# Patient Record
Sex: Female | Born: 1990 | Race: Black or African American | Hispanic: No | Marital: Single | Smoking: Current every day smoker
Health system: Southern US, Community
[De-identification: ages and names within clinical notes are randomized; demographics above are authoritative.]

## PROBLEM LIST (undated history)

## (undated) DIAGNOSIS — E669 Obesity, unspecified: Secondary | ICD-10-CM

## (undated) DIAGNOSIS — A599 Trichomoniasis, unspecified: Secondary | ICD-10-CM

## (undated) DIAGNOSIS — J45909 Unspecified asthma, uncomplicated: Secondary | ICD-10-CM

## (undated) DIAGNOSIS — E282 Polycystic ovarian syndrome: Secondary | ICD-10-CM

## (undated) HISTORY — DX: Unspecified asthma, uncomplicated: J45.909

## (undated) HISTORY — DX: Polycystic ovarian syndrome: E28.2

## (undated) HISTORY — DX: Obesity, unspecified: E66.9

## (undated) HISTORY — PX: NO PAST SURGERIES: SHX2092

## (undated) HISTORY — DX: Trichomoniasis, unspecified: A59.9

---
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
## 2012-02-23 ENCOUNTER — Ambulatory Visit (INDEPENDENT_AMBULATORY_CARE_PROVIDER_SITE_OTHER): Payer: Medicaid Other | Admitting: Otolaryngology

## 2012-03-01 ENCOUNTER — Ambulatory Visit (INDEPENDENT_AMBULATORY_CARE_PROVIDER_SITE_OTHER): Payer: Medicaid Other | Admitting: Otolaryngology

## 2012-03-01 DIAGNOSIS — G4730 Sleep apnea, unspecified: Secondary | ICD-10-CM

## 2012-03-08 ENCOUNTER — Other Ambulatory Visit: Payer: Self-pay

## 2012-03-08 DIAGNOSIS — G4700 Insomnia, unspecified: Secondary | ICD-10-CM

## 2012-03-26 ENCOUNTER — Other Ambulatory Visit: Payer: Self-pay | Admitting: Otolaryngology

## 2012-04-16 ENCOUNTER — Encounter: Payer: Self-pay | Admitting: *Deleted

## 2012-05-14 ENCOUNTER — Ambulatory Visit: Payer: Self-pay | Admitting: Pediatrics

## 2012-09-17 ENCOUNTER — Encounter: Payer: Self-pay | Admitting: Family Medicine

## 2012-09-17 ENCOUNTER — Ambulatory Visit (INDEPENDENT_AMBULATORY_CARE_PROVIDER_SITE_OTHER): Payer: Medicaid Other | Admitting: Family Medicine

## 2012-09-17 VITALS — BP 110/60 | Ht 63.5 in | Wt 243.6 lb

## 2012-09-17 DIAGNOSIS — N920 Excessive and frequent menstruation with regular cycle: Secondary | ICD-10-CM

## 2012-09-17 DIAGNOSIS — N926 Irregular menstruation, unspecified: Secondary | ICD-10-CM

## 2012-09-17 DIAGNOSIS — Z720 Tobacco use: Secondary | ICD-10-CM

## 2012-09-17 DIAGNOSIS — F17200 Nicotine dependence, unspecified, uncomplicated: Secondary | ICD-10-CM

## 2012-09-17 DIAGNOSIS — H538 Other visual disturbances: Secondary | ICD-10-CM | POA: Insufficient documentation

## 2012-09-17 LAB — POCT URINE PREGNANCY: Preg Test, Ur: NEGATIVE

## 2012-09-17 LAB — GLUCOSE, POCT (MANUAL RESULT ENTRY): POC Glucose: 124 mg/dl — AB (ref 70–99)

## 2012-09-17 NOTE — Progress Notes (Signed)
Subjective:    Patient ID: Gina Snow, female    DOB: 01-06-1991, 22 y.o.   MRN: [id number]  HPI Comments: Gina Snow is a 22 y.o AAF here, new to me but established to this practice.  She is here for a wellness exam.  She has complaints of irregular menstrual cycles.  She says her menstrual cycle this past month was irregular and prolonged. She says normally her periods have lasted for about 3-6 days. She first had menarche at age 22 years old. There have been times where she would have more than one period in a month.  She has an episode where she didn't have a period at all.  This occurred 4 months ago.  She says this month, she has had 3 weeks of her period.  She goes through a whole box of tampons in 2 days, each box having about 18 tampons, super absorbency. This is the first time she has had to go through this many tampons in a few days. She does have clots and she has more cramps.   She is sexually active with a female but the last time with a female, over 2 years.  She has a family hx of hypothyroid in her mother and maternal grandmother. She denies any hx of breast or ovarian cancers.   She doesn't have any medical problems that she knows of. She has never had any STD.  She smokes cigarettes and a pack will last 3 days.  She does drink alcohol occasionally about once every 2-3 months.  She doesn't have any children and has never been pregnant. She has a father who lives in [place] but she really doesn't stay in touch with him.       Review of Systems  Constitutional: Positive for unexpected weight change. Negative for activity change, appetite change and fatigue.  HENT: Negative for hearing loss, sore throat, trouble swallowing and voice change.   Eyes: Positive for visual disturbance.       Occasional vision changes/blurred vision   Respiratory: Negative for chest tightness, shortness of breath and wheezing.   Cardiovascular: Positive for palpitations. Negative for  chest pain.  Gastrointestinal: Negative for nausea, vomiting, abdominal pain, diarrhea and constipation.  Endocrine: Negative for cold intolerance, heat intolerance, polydipsia and polyuria.  Genitourinary: Positive for vaginal bleeding and menstrual problem. Negative for dysuria, urgency, decreased urine volume, vaginal discharge, vaginal pain and pelvic pain.  Skin: Negative for color change.  Neurological: Negative for dizziness, syncope, weakness, light-headedness, numbness and headaches.  Psychiatric/Behavioral: Negative for sleep disturbance.       Objective:   Physical Exam  Nursing note and vitals reviewed. Constitutional: She is oriented to person, place, and time.  Overweight AAF in NAD   HENT:  Head: Normocephalic and atraumatic.  Right Ear: External ear normal.  Left Ear: External ear normal.  Nose: Nose normal.  Mouth/Throat: Oropharynx is clear and moist.  Eyes: Conjunctivae and EOM are normal. Pupils are equal, round, and reactive to light.  Neck: Normal range of motion. Neck supple. No tracheal deviation present. No thyromegaly present.  Cardiovascular: Normal rate, regular rhythm, normal heart sounds and intact distal pulses.   No murmur heard. Pulmonary/Chest: Effort normal and breath sounds normal. No respiratory distress. She has no wheezes. She exhibits no tenderness.  Abdominal: Soft. Bowel sounds are normal. She exhibits no distension and no mass. There is no tenderness.  Genitourinary:  Deferred, still on menstrual cycle   Neurological: She is alert and oriented  to person, place, and time.  Skin: Skin is warm and dry. No pallor.  Psychiatric: She has a normal mood and affect. Her behavior is normal. Judgment and thought content normal.      Assessment & Plan:  Gina Snow was seen today for well child.  Diagnoses and associated orders for this visit:  Wellness exam  Blurred vision  Irregular menstrual cycle - CBC with Differential - TSH + free  T4 - POCT urine pregnancy - POCT glucose (manual entry)  Menorrhagia  -with history of menorrhagia, family hx of hypothyroidism; will obtain CBC for iron levels/platelet count.  Will also get TSH with T4 to check thyroid function.  Urine pregnancy was negative today in the office.  Will also get glucose since she's fasting and if elevated, may hint to PCOS/insulin resistance as cause of menorrhagia.    If these tests are inconclusive, will get prolactin, FSH/LH, pelvic ultrasound ordered during follow up in 1 week.    May begin OCP's if no etiology is found. Will need pelvic exam during follow up.

## 2012-09-17 NOTE — Patient Instructions (Addendum)
Menorrhagia Dysfunctional uterine bleeding is different from a normal menstrual period. When periods are heavy or there is more bleeding than is usual for you, it is called menorrhagia. It may be caused by hormonal imbalance, or physical, metabolic, or other problems. Examination is necessary in order that your caregiver may treat treatable causes. If this is a continuing problem, a D&C may be needed. That means that the cervix (the opening of the uterus or womb) is dilated (stretched larger) and the lining of the uterus is scraped out. The tissue scraped out is then examined under a microscope by a specialist (pathologist) to make sure there is nothing of concern that needs further or more extensive treatment. HOME CARE INSTRUCTIONS   If medications were prescribed, take exactly as directed. Do not change or switch medications without consulting your caregiver.  Long term heavy bleeding may result in iron deficiency. Your caregiver may have prescribed iron pills. They help replace the iron your body lost from heavy bleeding. Take exactly as directed. Iron may cause constipation. If this becomes a problem, increase the bran, fruits, and roughage in your diet.  Do not take aspirin or medicines that contain aspirin one week before or during your menstrual period. Aspirin may make the bleeding worse.  If you need to change your sanitary pad or tampon more than once every 2 hours, stay in bed and rest as much as possible until the bleeding stops.  Eat well-balanced meals. Eat foods high in iron. Examples are leafy green vegetables, meat, liver, eggs, and whole grain breads and cereals. Do not try to lose weight until the abnormal bleeding has stopped and your blood iron level is back to normal. SEEK MEDICAL CARE IF:   You need to change your sanitary pad or tampon more than once an hour.  You develop nausea (feeling sick to your stomach) and vomiting, dizziness, or diarrhea while you are taking your  medicine.  You have any problems that may be related to the medicine you are taking. SEEK IMMEDIATE MEDICAL CARE IF:   You have a fever.  You develop chills.  You develop severe bleeding or start to pass blood clots.  You feel dizzy or faint. MAKE SURE YOU:   Understand these instructions.  Will watch your condition.  Will get help right away if you are not doing well or get worse. Document Released: 01/31/2005 Document Revised: 04/25/2011 Document Reviewed: 09/21/2007 [facility] Patient Information 2014 [place], [place].  Hypothyroidism The thyroid is a large gland located in the lower front of your neck. The thyroid gland helps control metabolism. Metabolism is how your body handles food. It controls metabolism with the hormone thyroxine. When this gland is underactive (hypothyroid), it produces too little hormone.  CAUSES These include:   Absence or destruction of thyroid tissue.  Goiter due to iodine deficiency.  Goiter due to medications.  Congenital defects (since birth).  Problems with the pituitary. This causes a lack of TSH (thyroid stimulating hormone). This hormone tells the thyroid to turn out more hormone. SYMPTOMS  Lethargy (feeling as though you have no energy)  Cold intolerance  Weight gain (in spite of normal food intake)  Dry skin  Coarse hair  Menstrual irregularity (if severe, may lead to infertility)  Slowing of thought processes Cardiac problems are also caused by insufficient amounts of thyroid hormone. Hypothyroidism in the newborn is cretinism, and is an extreme form. It is important that this form be treated adequately and immediately or it will lead rapidly  to retarded physical and mental development. DIAGNOSIS  To prove hypothyroidism, your caregiver may do blood tests and ultrasound tests. Sometimes the signs are hidden. It may be necessary for your caregiver to watch this illness with blood tests either before or after diagnosis and  treatment. TREATMENT  Low levels of thyroid hormone are increased by using synthetic thyroid hormone. This is a safe, effective treatment. It usually takes about four weeks to gain the full effects of the medication. After you have the full effect of the medication, it will generally take another four weeks for problems to leave. Your caregiver may start you on low doses. If you have had heart problems the dose may be gradually increased. It is generally not an emergency to get rapidly to normal. HOME CARE INSTRUCTIONS   Take your medications as your caregiver suggests. Let your caregiver know of any medications you are taking or start taking. Your caregiver will help you with dosage schedules.  As your condition improves, your dosage needs may increase. It will be necessary to have continuing blood tests as suggested by your caregiver.  Report all suspected medication side effects to your caregiver. SEEK MEDICAL CARE IF: Seek medical care if you develop:  Sweating.  Tremulousness (tremors).  Anxiety.  Rapid weight loss.  Heat intolerance.  Emotional swings.  Diarrhea.  Weakness. SEEK IMMEDIATE MEDICAL CARE IF:  You develop chest pain, an irregular heart beat (palpitations), or a rapid heart beat. MAKE SURE YOU:   Understand these instructions.  Will watch your condition.  Will get help right away if you are not doing well or get worse. Document Released: 01/31/2005 Document Revised: 04/25/2011 Document Reviewed: 09/21/2007 [facility] Patient Information 2014 [place], [place].

## 2012-09-18 ENCOUNTER — Encounter: Payer: Self-pay | Admitting: Family Medicine

## 2012-09-18 LAB — CBC WITH DIFFERENTIAL/PLATELET
Basophils Absolute: 0 10*3/uL (ref 0.0–0.1)
Basophils Relative: 0 % (ref 0–1)
Eosinophils Absolute: 0.2 10*3/uL (ref 0.0–0.7)
Eosinophils Relative: 3 % (ref 0–5)
HCT: 38.3 % (ref 36.0–46.0)
Hemoglobin: 12.8 g/dL (ref 12.0–15.0)
Lymphocytes Relative: 29 % (ref 12–46)
Lymphs Abs: 1.9 10*3/uL (ref 0.7–4.0)
MCH: 30.9 pg (ref 26.0–34.0)
MCHC: 33.4 g/dL (ref 30.0–36.0)
MCV: 92.5 fL (ref 78.0–100.0)
Monocytes Absolute: 0.5 10*3/uL (ref 0.1–1.0)
Monocytes Relative: 8 % (ref 3–12)
Neutro Abs: 4.1 10*3/uL (ref 1.7–7.7)
Neutrophils Relative %: 60 % (ref 43–77)
Platelets: 315 10*3/uL (ref 150–400)
RBC: 4.14 MIL/uL (ref 3.87–5.11)
RDW: 15.2 % (ref 11.5–15.5)
WBC: 6.8 10*3/uL (ref 4.0–10.5)

## 2012-09-19 ENCOUNTER — Encounter: Payer: Self-pay | Admitting: Family Medicine

## 2012-09-19 LAB — TSH+FREE T4
Free T4: 1.11 ng/dL (ref 0.93–1.60)
TSH: 0.719 u[IU]/mL (ref 0.450–4.500)

## 2012-09-24 ENCOUNTER — Ambulatory Visit (INDEPENDENT_AMBULATORY_CARE_PROVIDER_SITE_OTHER): Payer: Medicaid Other | Admitting: Family Medicine

## 2012-09-24 VITALS — BP 122/78 | Wt 245.0 lb

## 2012-09-24 DIAGNOSIS — A499 Bacterial infection, unspecified: Secondary | ICD-10-CM

## 2012-09-24 DIAGNOSIS — N760 Acute vaginitis: Secondary | ICD-10-CM

## 2012-09-24 DIAGNOSIS — N898 Other specified noninflammatory disorders of vagina: Secondary | ICD-10-CM | POA: Insufficient documentation

## 2012-09-24 DIAGNOSIS — N920 Excessive and frequent menstruation with regular cycle: Secondary | ICD-10-CM

## 2012-09-24 DIAGNOSIS — R739 Hyperglycemia, unspecified: Secondary | ICD-10-CM | POA: Insufficient documentation

## 2012-09-24 DIAGNOSIS — R7309 Other abnormal glucose: Secondary | ICD-10-CM

## 2012-09-24 DIAGNOSIS — E559 Vitamin D deficiency, unspecified: Secondary | ICD-10-CM | POA: Insufficient documentation

## 2012-09-24 DIAGNOSIS — B9689 Other specified bacterial agents as the cause of diseases classified elsewhere: Secondary | ICD-10-CM

## 2012-09-24 DIAGNOSIS — N926 Irregular menstruation, unspecified: Secondary | ICD-10-CM | POA: Insufficient documentation

## 2012-09-24 LAB — COMPREHENSIVE METABOLIC PANEL
ALT: 18 U/L (ref 0–35)
AST: 16 U/L (ref 0–37)
Albumin: 4.5 g/dL (ref 3.5–5.2)
Alkaline Phosphatase: 60 U/L (ref 39–117)
BUN: 8 mg/dL (ref 6–23)
CO2: 28 mEq/L (ref 19–32)
Calcium: 9.3 mg/dL (ref 8.4–10.5)
Chloride: 105 mEq/L (ref 96–112)
Creat: 0.78 mg/dL (ref 0.50–1.10)
Glucose, Bld: 101 mg/dL — ABNORMAL HIGH (ref 70–99)
Potassium: 4.5 mEq/L (ref 3.5–5.3)
Sodium: 138 mEq/L (ref 135–145)
Total Bilirubin: 0.5 mg/dL (ref 0.3–1.2)
Total Protein: 7.3 g/dL (ref 6.0–8.3)

## 2012-09-24 LAB — LIPID PANEL
LDL Cholesterol: 99 mg/dL (ref 0–99)
Triglycerides: 106 mg/dL (ref <150)
VLDL: 21 mg/dL (ref 0–40)

## 2012-09-24 LAB — HEMOGLOBIN A1C
Hgb A1c MFr Bld: 5.4 % (ref <5.7)
Mean Plasma Glucose: 108 mg/dL (ref <117)

## 2012-09-24 MED ORDER — METFORMIN HCL 500 MG PO TABS: 500.0000 mg | ORAL_TABLET | Freq: Every day | ORAL | Status: DC

## 2012-09-24 NOTE — Patient Instructions (Addendum)
Exercise to Lose Weight Exercise and a healthy diet may help you lose weight. Your doctor may suggest specific exercises. EXERCISE IDEAS AND TIPS  Choose low-cost things you enjoy doing, such as walking, bicycling, or exercising to workout videos.  Take stairs instead of the elevator.  Walk during your lunch break.  Park your car further away from work or school.  Go to a gym or an exercise class.  Start with 5 to 10 minutes of exercise each day. Build up to 30 minutes of exercise 4 to 6 days a week.  Wear shoes with good support and comfortable clothes.  Stretch before and after working out.  Work out until you breathe harder and your heart beats faster.  Drink extra water when you exercise.  Do not do so much that you hurt yourself, feel dizzy, or get very short of breath. Exercises that burn about 150 calories:  Running 1  miles in 15 minutes.  Playing volleyball for 45 to 60 minutes.  Washing and waxing a car for 45 to 60 minutes.  Playing touch football for 45 minutes.  Walking 1  miles in 35 minutes.  Pushing a stroller 1  miles in 30 minutes.  Playing basketball for 30 minutes.  Raking leaves for 30 minutes.  Bicycling 5 miles in 30 minutes.  Walking 2 miles in 30 minutes.  Dancing for 30 minutes.  Shoveling snow for 15 minutes.  Swimming laps for 20 minutes.  Walking up stairs for 15 minutes.  Bicycling 4 miles in 15 minutes.  Gardening for 30 to 45 minutes.  Jumping rope for 15 minutes.  Washing windows or floors for 45 to 60 minutes. Document Released: 03/05/2010 Document Revised: 04/25/2011 Document Reviewed: 03/05/2010 [facility] Patient Information 2014 [place], [place]. Metformin tablets What is this medicine? METFORMIN (met FOR min) is used to treat type 2 diabetes. It helps to control blood sugar. Treatment is combined with diet and exercise. This medicine can be used alone or with other medicines for diabetes. This medicine may be  used for other purposes; ask your health care provider or pharmacist if you have questions. What should I tell my health care provider before I take this medicine? They need to know if you have any of these conditions: -anemia -frequently drink alcohol-containing beverages -become easily dehydrated -heart attack -heart failure that is treated with medications -kidney disease -liver disease -polycystic ovary syndrome -serious infection or injury -vomiting -an unusual or allergic reaction to metformin, other medicines, foods, dyes, or preservatives -pregnant or trying to get pregnant -breast-feeding How should I use this medicine? Take this medicine by mouth. Take it with meals. Swallow the tablets with a drink of water. Follow the directions on the prescription label. Take your medicine at regular intervals. Do not take your medicine more often than directed. Talk to your pediatrician regarding the use of this medicine in children. While this drug may be prescribed for children as young as 5 years of age for selected conditions, precautions do apply. Overdosage: If you think you have taken too much of this medicine contact a poison control center or emergency room at once. NOTE: This medicine is only for you. Do not share this medicine with others. What if I miss a dose? If you miss a dose, take it as soon as you can. If it is almost time for your next dose, take only that dose. Do not take double or extra doses. What may interact with this medicine? Do not take this medicine  with any of the following medications: -dofetilide -gatifloxacin -certain contrast medicines given before X-rays, CT scans, MRI, or other procedures This medicine may also interact with the following medications: -digoxin -diuretics -female hormones, like estrogens or progestins and birth control pills -isoniazid -medicines for blood pressure, heart disease, irregular heart beat -morphine -nicotinic  acid -phenothiazines like chlorpromazine, mesoridazine, prochlorperazine, thioridazine -phenytoin -procainamide -quinidine -quinine -ranitidine -steroid medicines like prednisone or cortisone -stimulant medicines for attention disorders, weight loss, or to stay awake -thyroid medicines -trimethoprim -vancomycin This list may not describe all possible interactions. Give your health care provider a list of all the medicines, herbs, non-prescription drugs, or dietary supplements you use. Also tell them if you smoke, drink alcohol, or use illegal drugs. Some items may interact with your medicine. What should I watch for while using this medicine? Visit your doctor or health care professional for regular checks on your progress. Learn how to check your blood sugar. Learn the symptoms of low and high blood sugar and how to manage them. If you have low blood sugar, eat or drink something that has sugar. Make sure others know to get medical help quickly if you have serious symptoms of low blood sugar, like if you become unconscious or have a seizure. If you need surgery or if you will need a procedure with contrast drugs, tell your doctor or health care professional that you are taking this medicine. Wear a medical identification bracelet or chain to say you have diabetes, and carry a card that lists all your medications. What side effects may I notice from receiving this medicine? Side effects that you should report to your doctor or health care professional as soon as possible: -allergic reactions like skin rash, itching or hives, swelling of the face, lips, or tongue -breathing problems -feeling faint or lightheaded, falls -low blood sugar (ask your doctor or health care professional for a list of these symptoms) -muscle aches or pains -slow or irregular heartbeat -unusual stomach pain or discomfort -unusually tired or weak Side effects that usually do not require medical attention (report to  your doctor or health care professional if they continue or are bothersome): -diarrhea -headache -heartburn -metallic taste in mouth -nausea -stomach gas, upset This list may not describe all possible side effects. Call your doctor for medical advice about side effects. You may report side effects to FDA at 1-800-FDA-1088. Where should I keep my medicine? Keep out of the reach of children. Store at room temperature between 15 and 30 degrees C (59 and 86 degrees F). Protect from moisture and light. Throw away any unused medicine after the expiration date. NOTE: This sheet is a summary. It may not cover all possible information. If you have questions about this medicine, talk to your doctor, pharmacist, or health care provider.  2012, [employer]vier/[employer] Standard. (08/20/2007 3:40:54 PM)bvBacterial Vaginosis Bacterial vaginosis (BV) is a vaginal infection where the normal balance of bacteria in the vagina is disrupted. The normal balance is then replaced by an overgrowth of certain bacteria. There are several different kinds of bacteria that can cause BV. BV is the most common vaginal infection in women of childbearing age. CAUSES   The cause of BV is not fully understood. BV develops when there is an increase or imbalance of harmful bacteria.  Some activities or behaviors can upset the normal balance of bacteria in the vagina and put women at increased risk including:  Having a new sex partner or multiple sex partners.  Douching.  Using an  intrauterine device (IUD) for contraception.  It is not clear what role sexual activity plays in the development of BV. However, women that have never had sexual intercourse are rarely infected with BV. Women do not get BV from toilet seats, bedding, swimming pools or from touching objects around them.  SYMPTOMS   Grey vaginal discharge.  A fish-like odor with discharge, especially after sexual intercourse.  Itching or burning of the vagina and  vulva.  Burning or pain with urination.  Some women have no signs or symptoms at all. DIAGNOSIS  Your caregiver must examine the vagina for signs of BV. Your caregiver will perform lab tests and look at the sample of vaginal fluid through a microscope. They will look for bacteria and abnormal cells (clue cells), a pH test higher than 4.5, and a positive amine test all associated with BV.  RISKS AND COMPLICATIONS   Pelvic inflammatory disease (PID).  Infections following gynecology surgery.  Developing HIV.  Developing herpes virus. TREATMENT  Sometimes BV will clear up without treatment. However, all women with symptoms of BV should be treated to avoid complications, especially if gynecology surgery is planned. Female partners generally do not need to be treated. However, BV may spread between female sex partners so treatment is helpful in preventing a recurrence of BV.   BV may be treated with antibiotics. The antibiotics come in either pill or vaginal cream forms. Either can be used with nonpregnant or pregnant women, but the recommended dosages differ. These antibiotics are not harmful to the baby.  BV can recur after treatment. If this happens, a second round of antibiotics will often be prescribed.  Treatment is important for pregnant women. If not treated, BV can cause a premature delivery, especially for a pregnant woman who had a premature birth in the past. All pregnant women who have symptoms of BV should be checked and treated.  For chronic reoccurrence of BV, treatment with a type of prescribed gel vaginally twice a week is helpful. HOME CARE INSTRUCTIONS   Finish all medication as directed by your caregiver.  Do not have sex until treatment is completed.  Tell your sexual partner that you have a vaginal infection. They should see their caregiver and be treated if they have problems, such as a mild rash or itching.  Practice safe sex. Use condoms. Only have 1 sex  partner. PREVENTION  Basic prevention steps can help reduce the risk of upsetting the natural balance of bacteria in the vagina and developing BV:  Do not have sexual intercourse (be abstinent).  Do not douche.  Use all of the medicine prescribed for treatment of BV, even if the signs and symptoms go away.  Tell your sex partner if you have BV. That way, they can be treated, if needed, to prevent reoccurrence. SEEK MEDICAL CARE IF:   Your symptoms are not improving after 3 days of treatment.  You have increased discharge, pain, or fever. MAKE SURE YOU:   Understand these instructions.  Will watch your condition.  Will get help right away if you are not doing well or get worse. FOR MORE INFORMATION  Division of STD Prevention (DSTDP), Centers for Disease Control and Prevention: [employer] American Social Health Association (ASHA): www.ashastd.org  Document Released: 01/31/2005 Document Revised: 04/25/2011 Document Reviewed: 07/24/2008 [facility] Patient Information 2014 [place], [place].

## 2012-09-24 NOTE — Progress Notes (Signed)
Subjective:    Patient ID: Gina Snow, female    DOB: 09-Oct-1990, 22 y.o.   MRN: [id number]  HPI Comments: Gina Snow is a 22 y.o AAF here for follow up of irregular menses.  She was seen initially on 09/17/12 for irregular menses. At that time, she was on her period for 3 weeks prior. She says she's had abnormal periods for about 4 months. She says she did notice a weight gain in this period but denies any new medicines.  She says she has ranged between 200-220 pounds. She says she would lose weight and stop exercising and watching her diet. Her weight would come back after she stops these activities. She had thyroid function checked and this was normal. She was noted to have elevated sugar at that time as well with a positive family history of diabetes, HTN, and HLD.    She has a PMH of vitamin D deficiency but says she has been off of her medicines for some months. She's up to date with her immunizations. She continues to use tobacco products and she's sexually active with a female only.      Review of Systems  Constitutional: Positive for unexpected weight change. Negative for activity change, appetite change and fatigue.  Eyes: Negative for visual disturbance.  Respiratory: Negative for chest tightness, shortness of breath and wheezing.   Cardiovascular: Negative for chest pain and palpitations.  Gastrointestinal: Negative for nausea, vomiting, abdominal pain, diarrhea and constipation.  Endocrine: Negative for polydipsia and polyuria.  Genitourinary: Positive for menstrual problem. Negative for dysuria, urgency, hematuria, flank pain, vaginal bleeding, vaginal discharge, vaginal pain and pelvic pain.  Skin: Negative for color change and wound.  Neurological: Negative for dizziness, weakness and headaches.  Psychiatric/Behavioral: Negative for behavioral problems and decreased concentration.       Objective:   Physical Exam  Nursing note and vitals reviewed. Constitutional:  She is oriented to person, place, and time.  Obese AAF in NAD  HENT:  Head: Normocephalic and atraumatic.  Right Ear: External ear normal.  Left Ear: External ear normal.  Nose: Nose normal.  Mouth/Throat: Oropharynx is clear and moist.  Eyes: Conjunctivae and EOM are normal. Pupils are equal, round, and reactive to light.  Neck: Normal range of motion. No tracheal deviation present. No thyromegaly present.  Cardiovascular: Normal rate and normal heart sounds.   Pulmonary/Chest: Effort normal and breath sounds normal. No respiratory distress. She has no wheezes.  Abdominal: Soft. Bowel sounds are normal. She exhibits no distension. There is no tenderness. There is no rebound.  Genitourinary: Uterus normal. Pelvic exam was performed with patient supine. Uterus is not deviated, not enlarged, not fixed and not tender. Cervix exhibits discharge. Cervix exhibits no motion tenderness and no friability. Right adnexum displays no tenderness. Left adnexum displays no tenderness. Vaginal discharge found.  Musculoskeletal: Normal range of motion.  Neurological: She is alert and oriented to person, place, and time.  Skin: Skin is warm and dry. No rash noted.  Psychiatric: She has a normal mood and affect. Her behavior is normal.      Assessment & Plan:  Gina Snow was seen today for follow-up.  Diagnoses and associated orders for this visit:  Irregular menses - US Transvaginal Non-OB; Future  Hyperglycemia - Hemoglobin A1c - Comprehensive metabolic panel - Lipid panel - metFORMIN (GLUCOPHAGE) 500 MG tablet; Take 1 tablet (500 mg total) by mouth daily with breakfast.  Unspecified vitamin D deficiency - Vitamin D 25 hydroxy  BV (bacterial  vaginosis) - Cancel: Bacterial Vaginosis w/out Reflex - Bacterial Vaginosis w/out Reflex  Menorrhagia - US Transvaginal Non-OB; Future   -will schedule ultrasound. Start Metformin daily for insulin resistance. Getting Hgb A1c to check.  Also getting CMP  and lipid panel baseline labs before starting metformin.  Menorrhagia may be due to PCOS or fibroids. Ultrasound will rule in or out. Vaginal exam with discharge, likely BV. Will empirically treat with Flagyl. Have counseled patient on avoiding alcoholic drinks while taking this medicine. Metformin may cause diarrhea, etc and patient aware of possible side effects.  Will follow up in 4 weeks.

## 2012-09-25 ENCOUNTER — Telehealth: Payer: Self-pay | Admitting: *Deleted

## 2012-09-25 LAB — VITAMIN D 25 HYDROXY (VIT D DEFICIENCY, FRACTURES): Vit D, 25-Hydroxy: 27 ng/mL — ABNORMAL LOW (ref 30–89)

## 2012-09-25 NOTE — Telephone Encounter (Signed)
Patient called and stated that she has been to the pharmacy 2 times and her rx's aren't there.  I see where the metformin was entered but pharmacy says they didn't get it, and the flagyl wasn't sent over.    [facility]

## 2012-09-26 ENCOUNTER — Other Ambulatory Visit: Payer: Self-pay | Admitting: Family Medicine

## 2012-09-26 DIAGNOSIS — N760 Acute vaginitis: Secondary | ICD-10-CM

## 2012-09-26 DIAGNOSIS — R739 Hyperglycemia, unspecified: Secondary | ICD-10-CM

## 2012-09-26 LAB — BACTERIAL VAGINOSIS W/OUT REFLEX: Trichomonas vaginalis: NOT DETECTED

## 2012-09-26 MED ORDER — METRONIDAZOLE 500 MG PO TABS: 500.0000 mg | ORAL_TABLET | Freq: Two times a day (BID) | ORAL | Status: DC

## 2012-09-26 MED ORDER — METFORMIN HCL 500 MG PO TABS: 500.0000 mg | ORAL_TABLET | Freq: Every day | ORAL | Status: DC

## 2012-09-26 NOTE — Progress Notes (Signed)
Have attempted to contact patient at home and someone stated she no longer lived there. I have also contacted her on the cell phone on number that was provided and it was disconnected. Have sent in Metformin and Flagyl to [facility] as requested.

## 2012-10-26 ENCOUNTER — Encounter: Payer: Self-pay | Admitting: Family Medicine

## 2012-10-26 ENCOUNTER — Ambulatory Visit: Payer: Medicaid Other | Admitting: Family Medicine

## 2012-10-26 ENCOUNTER — Ambulatory Visit (INDEPENDENT_AMBULATORY_CARE_PROVIDER_SITE_OTHER): Payer: Medicaid Other | Admitting: Family Medicine

## 2012-10-26 VITALS — Temp 97.0°F | Wt 248.1 lb

## 2012-10-26 DIAGNOSIS — N926 Irregular menstruation, unspecified: Secondary | ICD-10-CM

## 2012-10-26 DIAGNOSIS — R7303 Prediabetes: Secondary | ICD-10-CM | POA: Insufficient documentation

## 2012-10-26 DIAGNOSIS — R7309 Other abnormal glucose: Secondary | ICD-10-CM

## 2012-10-26 NOTE — Patient Instructions (Addendum)
Secondary Amenorrhea   Secondary amenorrhea is the stopping of menstrual flow for 3 to 6 months in a female who has previously had periods. There are many possible causes. Most of these causes are not serious. Usually treating the underlying problem causing the loss of menses will return your periods to normal.  CAUSES   Some common and uncommon causes of not menstruating include:  · Malnutrition.  · Low blood sugar (hypoglycemia).  · Polycystic ovarian disease.  · Stress or fear.  · Breastfeeding.  · Hormone imbalance.  · Ovarian failure.  · Medications.  · Extreme obesity.  · Cystic fibrosis.  · Low body weight or drastic weight reduction from any cause.  · Early menopause.  · Removal of ovaries or uterus.  · Contraceptives.  · Illness.  · Long term (chronic) illnesses.  · Cushing's syndrome.  · Thyroid problems.  · Birth control pills, patches, or vaginal rings for birth control.  DIAGNOSIS   This diagnosis is made by your caregiver taking a medical history and doing a physical exam. Pregnancy must be ruled out. Often times, numerous blood tests of different hormones in the body may be measured. Urine testing may be done. Specialized x-rays may have to be done as well as measuring the body mass index (BMI).  TREATMENT   Treatment depends on the cause of the amenorrhea. If an eating disorder is present, this can be treated with an adequate diet and therapy. Chronic illnesses may improve with treatment of the illness. Overall, the outlook is good. The amenorrhea may be corrected with medications, lifestyle changes, or surgery. If the amenorrhea cannot be corrected, it is sometimes possible to create a false menstruation with medications.  Document Released: 03/14/2006 Document Revised: 04/25/2011 Document Reviewed: 01/19/2007  [employer]® Patient Information ©2014 [employer].

## 2012-10-26 NOTE — Progress Notes (Signed)
  Subjective:    Patient ID: Gina Snow, female    DOB: 03/29/1990, 22 y.o.   MRN: [id number]  HPI Comments: Gina Snow is a 22 y.o AAF here for follow up.  She has had menorrhagia and abnormal menses for months. She has had abnormal weight gain as well. She was worked up and had normal tests except for elevated glucose. She was started on metformin daily and has been on this medicine since seen last on 8/11. She had a transvaginal ultrasound ordered during last visit and it's still pending to be scheduled. She has done well with the metformin and has been compliant with this medicine. She has no side effects from this medicine.      Review of Systems  Genitourinary: Positive for menstrual problem.       Irregular menses       Objective:   Physical Exam  Nursing note and vitals reviewed. Constitutional:  obesed AAF in NAD   HENT:  Head: Normocephalic and atraumatic.  Skin: Skin is warm and dry.  Psychiatric: She has a normal mood and affect. Her behavior is normal.       Assessment & Plan:  Gina Snow was seen today for follow-up.  Diagnoses and associated orders for this visit:  Irregular menses -labs normal. Awaiting TVUS to be scheduled.   Prediabetes -to continue the Metformin and will repeat labs in another 2 months.

## 2012-12-21 ENCOUNTER — Ambulatory Visit (INDEPENDENT_AMBULATORY_CARE_PROVIDER_SITE_OTHER): Payer: Medicaid Other | Admitting: Family Medicine

## 2012-12-21 ENCOUNTER — Encounter: Payer: Self-pay | Admitting: Family Medicine

## 2012-12-21 VITALS — BP 128/74 | HR 88 | Temp 97.0°F | Resp 20 | Ht 62.5 in | Wt 243.2 lb

## 2012-12-21 DIAGNOSIS — R739 Hyperglycemia, unspecified: Secondary | ICD-10-CM

## 2012-12-21 DIAGNOSIS — R7302 Impaired glucose tolerance (oral): Secondary | ICD-10-CM

## 2012-12-21 DIAGNOSIS — N926 Irregular menstruation, unspecified: Secondary | ICD-10-CM

## 2012-12-21 DIAGNOSIS — E282 Polycystic ovarian syndrome: Secondary | ICD-10-CM | POA: Insufficient documentation

## 2012-12-21 DIAGNOSIS — R7309 Other abnormal glucose: Secondary | ICD-10-CM

## 2012-12-21 LAB — BASIC METABOLIC PANEL
BUN: 10 mg/dL (ref 6–23)
CO2: 28 mEq/L (ref 19–32)
Chloride: 102 mEq/L (ref 96–112)
Glucose, Bld: 82 mg/dL (ref 70–99)
Potassium: 4.6 mEq/L (ref 3.5–5.3)

## 2012-12-21 MED ORDER — METFORMIN HCL 500 MG PO TABS: 500.0000 mg | ORAL_TABLET | Freq: Every day | ORAL | Status: DC

## 2012-12-21 NOTE — Patient Instructions (Signed)
Polycystic Ovarian Syndrome Polycystic ovarian syndrome is a condition with a number of problems. One problem is with the ovaries. The ovaries are organs located in the female pelvis, on each side of the uterus. Usually, during the menstrual cycle, an egg is released from 1 ovary every month. This is called ovulation. When the egg is fertilized, it goes into the womb (uterus), which allows for the growth of a baby. The egg travels from the ovary through the fallopian tube to the uterus. The ovaries also make the hormones estrogen and progesterone. These hormones help the development of a woman's breasts, body shape, and body hair. They also regulate the menstrual cycle and pregnancy. Sometimes, cysts form in the ovaries. A cyst is a fluid-filled sac. On the ovary, different types of cysts can form. The most common type of ovarian cyst is called a functional or ovulation cyst. It is normal, and often forms during the normal menstrual cycle. Each month, a woman's ovaries grow tiny cysts that hold the eggs. When an egg is fully grown, the sac breaks open. This releases the egg. Then, the sac which released the egg from the ovary dissolves. In one type of functional cyst, called a follicle cyst, the sac does not break open to release the egg. It may actually continue to grow. This type of cyst usually disappears within 1 to 3 months.  One type of cyst problem with the ovaries is called Polycystic Ovarian Syndrome (PCOS). In this condition, many follicle cysts form, but do not rupture and produce an egg. This health problem can affect the following:  Menstrual cycle.  Heart.  Obesity.  Cancer of the uterus.  Fertility.  Blood vessels.  Hair growth (face and body) or baldness.  Hormones.  Appearance.  High blood pressure.  Stroke.  Insulin production.  Inflammation of the liver.  Elevated blood cholesterol and triglycerides. CAUSES   No one knows the exact cause of PCOS.  Women with  PCOS often have a mother or sister with PCOS. There is not yet enough proof to say this is inherited.  Many women with PCOS have a weight problem.  Researchers are looking at the relationship between PCOS and the body's ability to make insulin. Insulin is a hormone that regulates the change of sugar, starches, and other food into energy for the body's use, or for storage. Some women with PCOS make too much insulin. It is possible that the ovaries react by making too many female hormones, called androgens. This can lead to acne, excessive hair growth, weight gain, and ovulation problems.  Too much production of luteinizing hormone (LH) from the pituitary gland in the brain stimulates the ovary to produce too much female hormone (androgen). SYMPTOMS   Infrequent or no menstrual periods, and/or irregular bleeding.  Inability to get pregnant (infertility), because of not ovulating.  Increased growth of hair on the face, chest, stomach, back, thumbs, thighs, or toes.  Acne, oily skin, or dandruff.  Pelvic pain.  Weight gain or obesity, usually carrying extra weight around the waist.  Type 2 diabetes (this is the diabetes that usually does not need insulin).  High cholesterol.  High blood pressure.  Female-pattern baldness or thinning hair.  Patches of thickened and dark brown or black skin on the neck, arms, breasts, or thighs.  Skin tags, or tiny excess flaps of skin, in the armpits or neck area.  Sleep apnea (excessive snoring and breathing stops at times while asleep).  Deepening of the voice.  Gestational diabetes when pregnant.  Increased risk of miscarriage with pregnancy. DIAGNOSIS  There is no single test to diagnose PCOS.   Your caregiver will:  Take a medical history.  Perform a pelvic exam.  Perform an ultrasound.  Check your female and female hormone levels.  Measure glucose or sugar levels in the blood.  Do other blood tests.  If you are producing too many  female hormones, your caregiver will make sure it is from PCOS. At the physical exam, your caregiver will want to evaluate the areas of increased hair growth. Try to allow natural hair growth for a few days before the visit.  During a pelvic exam, the ovaries may be enlarged or swollen by the increased number of small cysts. This can be seen more easily by vaginal ultrasound or screening, to examine the ovaries and lining of the uterus (endometrium) for cysts. The uterine lining may become thicker, if there has not been a regular period. TREATMENT  Because there is no cure for PCOS, it needs to be managed to prevent problems. Treatments are based on your symptoms. Treatment is also based on whether you want to have a baby or whether you need contraception.  Treatment may include:  Progesterone hormone, to start a menstrual period.  Birth control pills, to make you have regular menstrual periods.  Medicines to make you ovulate, if you want to get pregnant.  Medicines to control your insulin.  Medicine to control your blood pressure.  Medicine and diet, to control your high cholesterol and triglycerides in your blood.  Surgery, making small holes in the ovary, to decrease the amount of female hormone production. This is done through a long, lighted tube (laparoscope), placed into the pelvis through a tiny incision in the lower abdomen. Your caregiver will go over some of the choices with you. WOMEN WITH PCOS HAVE THESE CHARACTERISTICS:  High levels of female hormones called androgens.  An irregular or no menstrual cycle.  May have many small cysts in their ovaries. PCOS is the most common hormonal reproductive problem in women of childbearing age. WHY DO WOMEN WITH PCOS HAVE TROUBLE WITH THEIR MENSTRUAL CYCLE? Each month, about 20 eggs start to mature in the ovaries. As one egg grows and matures, the follicle breaks open to release the egg, so it can travel through the fallopian tube for  fertilization. When the single egg leaves the follicle, ovulation takes place. In women with PCOS, the ovary does not make all of the hormones it needs for any of the eggs to fully mature. They may start to grow and accumulate fluid, but no one egg becomes large enough. Instead, some may remain as cysts. Since no egg matures or is released, ovulation does not occur and the hormone progesterone is not made. Without progesterone, a woman's menstrual cycle is irregular or absent. Also, the cysts produce female hormones, which continue to prevent ovulation.  Document Released: 05/27/2004 Document Revised: 04/25/2011 Document Reviewed: 07/19/2012 [facility] Patient Information 2014 [place], [place].

## 2012-12-23 NOTE — Progress Notes (Signed)
  Subjective:    Patient ID: Gina Snow, female    DOB: [birth year], 22 y.o.   MRN: [id number]  HPI Comments: Gina Snow is a 22 y.o AAF here for follow up of her amenorrhea.  She was seen initially and reported abnormal menses. She said that her periods would last for weeks. The first time I saw her, she was still on her period after about 2 weeks. She also reports that there are times her periods would skip months. She could never tell when her period would come on. She's involved with another female and so she knew she wasn't pregnant. Labs were done including  Urine pregnancy which were all essentially normal except some hyperglycemia. She also was noted to be obesed and had some chin hair. I suspected PCOS and ordered a TVUS which was never done. After the first visit, she was seen at follow up and started on Metformin due to the hyperglycemia. Her Hgb a1c was 5.4 at that time. She had been on Metformin 500mg  daily for about a month and a half. She is here today and says her periods are better. She says her periods have come on 2x since being on the Metformin and they only last 5 days. She says she has also been trying to eat healthy and she walks around her neighborhood about 4 days a week. She is noted to have some weight loss today as well.   PMH: menorrhagia now resolved Medications: Metformin Allergies: NKDA   Review of Systems  Constitutional: Negative for activity change, appetite change, fatigue and unexpected weight change.  HENT: Negative for congestion.   Eyes: Negative for visual disturbance.  Endocrine: Negative for cold intolerance, heat intolerance, polydipsia and polyuria.  Genitourinary: Positive for menstrual problem. Negative for vaginal bleeding, vaginal discharge and vaginal pain.       Menorrhagia and irregular menses resolved        Objective:   Physical Exam  Nursing note and vitals reviewed. Constitutional:  obesed AAF in NAD  HENT:  Head: Normocephalic  and atraumatic.  Cardiovascular: Normal rate, regular rhythm, normal heart sounds and intact distal pulses.   Pulmonary/Chest: Effort normal and breath sounds normal. No respiratory distress. She has no wheezes. She exhibits no tenderness.  Skin: Skin is warm and dry.  Psychiatric: She has a normal mood and affect. Her behavior is normal.      Assessment & Plan:  Elen was seen today for follow-up.  Diagnoses and associated orders for this visit:  Irregular menses - metFORMIN (GLUCOPHAGE) 500 MG tablet; Take 1 tablet (500 mg total) by mouth daily with breakfast.  PCOS (polycystic ovarian syndrome) - metFORMIN (GLUCOPHAGE) 500 MG tablet; Take 1 tablet (500 mg total) by mouth daily with breakfast. - Basic metabolic panel  Hyperglycemia - metFORMIN (GLUCOPHAGE) 500 MG tablet; Take 1 tablet (500 mg total) by mouth daily with breakfast. - Basic metabolic panel  Glucose intolerance (impaired glucose tolerance) - metFORMIN (GLUCOPHAGE) 500 MG tablet; Take 1 tablet (500 mg total) by mouth daily with breakfast. - Basic metabolic panel   will refill Metformin since the patient is getting benefit from this with regular periods x 2 months. Will continue to monitor for now and hold off on the TVUS unless her periods become irregular again. _will get BMP to check renal function -handout given to patient regarding the PCOS Follow up in 3 mo nths

## 2012-12-24 ENCOUNTER — Encounter: Payer: Self-pay | Admitting: Family Medicine

## 2013-03-06 ENCOUNTER — Emergency Department (HOSPITAL_COMMUNITY)
Admission: EM | Admit: 2013-03-06 | Discharge: 2013-03-06 | Disposition: A | Discharge status: Home / Self Care | Payer: Medicaid Other | Attending: Emergency Medicine | Admitting: Emergency Medicine

## 2013-03-06 ENCOUNTER — Encounter (HOSPITAL_COMMUNITY): Payer: Self-pay | Admitting: Emergency Medicine

## 2013-03-06 DIAGNOSIS — F17200 Nicotine dependence, unspecified, uncomplicated: Secondary | ICD-10-CM | POA: Insufficient documentation

## 2013-03-06 DIAGNOSIS — S41109A Unspecified open wound of unspecified upper arm, initial encounter: Secondary | ICD-10-CM | POA: Insufficient documentation

## 2013-03-06 DIAGNOSIS — Y929 Unspecified place or not applicable: Secondary | ICD-10-CM | POA: Insufficient documentation

## 2013-03-06 DIAGNOSIS — T148XXA Other injury of unspecified body region, initial encounter: Secondary | ICD-10-CM

## 2013-03-06 DIAGNOSIS — Y9389 Activity, other specified: Secondary | ICD-10-CM | POA: Insufficient documentation

## 2013-03-06 DIAGNOSIS — Z79899 Other long term (current) drug therapy: Secondary | ICD-10-CM | POA: Insufficient documentation

## 2013-03-06 DIAGNOSIS — W540XXA Bitten by dog, initial encounter: Secondary | ICD-10-CM | POA: Insufficient documentation

## 2013-03-06 MED ORDER — BACITRACIN-NEOMYCIN-POLYMYXIN 400-5-5000 EX OINT
TOPICAL_OINTMENT | Freq: Once | CUTANEOUS | Status: AC
  Administered 2013-03-06: 20:00:00 via TOPICAL
  Filled 2013-03-06: qty 1

## 2013-03-06 MED ORDER — AMOXICILLIN-POT CLAVULANATE 875-125 MG PO TABS: 1.0000 | ORAL_TABLET | Freq: Two times a day (BID) | ORAL | Status: DC

## 2013-03-06 MED ORDER — AMOXICILLIN-POT CLAVULANATE 875-125 MG PO TABS
1.0000 | ORAL_TABLET | Freq: Once | ORAL | Status: AC
  Administered 2013-03-06: 1 via ORAL
  Filled 2013-03-06: qty 1

## 2013-03-06 NOTE — ED Notes (Signed)
Pt c/o dog bite to left upper arm after unknown dog attacked her dog.

## 2013-03-06 NOTE — ED Provider Notes (Signed)
Medical screening examination/treatment/procedure(s) were performed by non-physician practitioner and as supervising physician I was immediately available for consultation/collaboration.  EKG Interpretation   None       Rolland Porter, MD, Abr[facility] Sander   Janice Norrie, MD 03/06/13 2031

## 2013-03-06 NOTE — Discharge Instructions (Signed)
Please cleanse her wound with soap and water daily. Please apply a Neosporin bandage daily until healed. Please stay in touch with the Sheriff's Department/animal control for report concerning the dog bit too. Animal Bite Animal bite wounds can get infected. It is important to get proper medical treatment. Ask your doctor if you need a rabies shot. HOME CARE   Follow your doctor's instructions for taking care of your wound.  Only take medicine as told by your doctor.  Take your medicine (antibiotics) as told. Finish them even if you start to feel better.  Keep all doctor visits as told. You may need a tetanus shot if:   You cannot remember when you had your last tetanus shot.  You have never had a tetanus shot.  The injury broke your skin. If you need a tetanus shot and you choose not to have one, you may get tetanus. Sickness from tetanus can be serious. GET HELP RIGHT AWAY IF:   Your wound is warm, red, sore, or puffy (swollen).  You notice yellowish-white fluid (pus) or a bad smell coming from the wound.  You see a red line on the skin coming from the wound.  You have a fever, chills, or you feel sick.  You feel sick to your stomach (nauseous), or you throw up (vomit).  Your pain does not go away, or it gets worse.  You have trouble moving the injured part.  You have questions or concerns. MAKE SURE YOU:   Understand these instructions.  Will watch your condition.  Will get help right away if you are not doing well or get worse. Document Released: 01/31/2005 Document Revised: 04/25/2011 Document Reviewed: 09/22/2010 [facility] Patient Information 2014 [place], [place].

## 2013-03-06 NOTE — ED Notes (Signed)
Pt with puncture wound to left elbow area where neighbor's dog bite her, pt states that she spoken with law enforcement concerning dog bite

## 2013-03-06 NOTE — ED Provider Notes (Signed)
CSN: [id number]     Arrival date & time 03/06/13  1727 History   First MD Initiated Contact with Patient 03/06/13 2002     Chief Complaint  Patient presents with  . Animal Bite   (Consider location/radiation/quality/duration/timing/severity/associated sxs/prior Treatment) HPI Comments: Patient is a [age]-year old female who presents to the emergency department with a dog bite to the left upper arm. The patient states that in neighborhood dog attacked her dog and in the attempt of helping her dog she got bit. The patient states that she is up-to-date on her tetanus. The patient has contacted animal control and they have come to the resident's to secure the dog for observation. There was very minimal bleeding. The patient denies any problems with moving her left arm, grip, or sensation.  Patient is a 23 y.o. female presenting with animal bite. The history is provided by the patient.  Animal Bite   History reviewed. No pertinent past medical history. History reviewed. No pertinent past surgical history. No family history on file. History  Substance Use Topics  . Smoking status: Current Every Day Smoker -- 1.00 packs/day for 7 years    Types: Cigarettes  . Smokeless tobacco: Not on file  . Alcohol Use: No   OB History   Grav Para Term Preterm Abortions TAB SAB Ect Mult Living                 Review of Systems  Constitutional: Negative for activity change.       All ROS Neg except as noted in HPI  HENT: Negative for nosebleeds.   Eyes: Negative for photophobia and discharge.  Respiratory: Negative for cough, shortness of breath and wheezing.   Cardiovascular: Negative for chest pain and palpitations.  Gastrointestinal: Negative for abdominal pain and blood in stool.  Genitourinary: Negative for dysuria, frequency and hematuria.  Musculoskeletal: Negative for arthralgias, back pain and neck pain.  Skin: Negative.   Neurological: Negative for dizziness, seizures and speech  difficulty.  Psychiatric/Behavioral: Negative for hallucinations and confusion.    Allergies  Review of patient's allergies indicates no known allergies.  Home Medications   Current Outpatient Rx  Name  Route  Sig  Dispense  Refill  . metFORMIN (GLUCOPHAGE) 500 MG tablet   Oral   Take 1 tablet (500 mg total) by mouth daily with breakfast.   30 tablet   2   . amoxicillin-clavulanate (AUGMENTIN) 875-125 MG per tablet   Oral   Take 1 tablet by mouth 2 (two) times daily.   10 tablet   0    BP 149/99  Pulse 73  Temp(Src) 98.8 F (37.1 C) (Oral)  Resp 18  Ht 5\' 3"  (1.6 m)  Wt 238 lb (107.956 kg)  BMI 42.17 kg/m2  SpO2 100%  LMP 02/02/2013 Physical Exam  Nursing note and vitals reviewed. Constitutional: She is oriented to person, place, and time. She appears well-developed and well-nourished.  Non-toxic appearance.  HENT:  Head: Normocephalic.  Right Ear: Tympanic membrane and external ear normal.  Left Ear: Tympanic membrane and external ear normal.  Eyes: EOM and lids are normal. Pupils are equal, round, and reactive to light.  Neck: Normal range of motion. Neck supple. Carotid bruit is not present.  Cardiovascular: Normal rate, regular rhythm, normal heart sounds, intact distal pulses and normal pulses.   Pulmonary/Chest: Breath sounds normal. No respiratory distress.  Abdominal: Soft. Bowel sounds are normal. There is no tenderness. There is no guarding.  Musculoskeletal: Normal range of  motion.       Arms: There is full range of motion of the left upper extremity. Full range of motion of the elbow, wrist, fingers. Capillary refill is less than 2 seconds. Radial pulses 2+.  Lymphadenopathy:       Head (right side): No submandibular adenopathy present.       Head (left side): No submandibular adenopathy present.    She has no cervical adenopathy.  Neurological: She is alert and oriented to person, place, and time. She has normal strength. No cranial nerve deficit or  sensory deficit.  No gross neurologic deficit of the upper right or left extremity.  Skin: Skin is warm and dry.  Psychiatric: She has a normal mood and affect. Her speech is normal.    ED Course  Procedures (including critical care time) Labs Review Labs Reviewed - No data to display Imaging Review No results found.  EKG Interpretation   None       MDM   1. Animal bite    **I have reviewed nursing notes, vital signs, and all appropriate lab and imaging results for this patient.*  Patient sustained a dog bite to the left upper arm. The bite it seems to be a shallow area just above the posterior left elbow. No bleeding at the time of examination. Patient has full range of motion of the arm. No sensory deficits of the left arm. The patient has contacted animal control in the [facility] have come out to the house to evaluate the dog that did the biting.  Prescription for Augmentin 1 twice a day given to the patient. The patient is to apply a Neosporin bandage daily until the wound heals.  Lenox Ahr, PA-C 03/06/13 2024

## 2013-03-25 ENCOUNTER — Encounter: Payer: Self-pay | Admitting: Family Medicine

## 2013-03-25 ENCOUNTER — Ambulatory Visit (INDEPENDENT_AMBULATORY_CARE_PROVIDER_SITE_OTHER): Payer: Medicaid Other | Admitting: Family Medicine

## 2013-03-25 DIAGNOSIS — S41159A Open bite of unspecified upper arm, initial encounter: Secondary | ICD-10-CM | POA: Insufficient documentation

## 2013-03-25 DIAGNOSIS — R7303 Prediabetes: Secondary | ICD-10-CM

## 2013-03-25 DIAGNOSIS — S41109A Unspecified open wound of unspecified upper arm, initial encounter: Secondary | ICD-10-CM

## 2013-03-25 DIAGNOSIS — E282 Polycystic ovarian syndrome: Secondary | ICD-10-CM

## 2013-03-25 DIAGNOSIS — R7309 Other abnormal glucose: Secondary | ICD-10-CM

## 2013-03-25 DIAGNOSIS — W540XXA Bitten by dog, initial encounter: Secondary | ICD-10-CM

## 2013-03-25 NOTE — Progress Notes (Signed)
Subjective:     Patient ID: Gina Snow, female   DOB: [birth year], 23 y.o.   MRN: [id number]  HPI Comments: Gina Snow is a 23 y.o AAF here for follow up from her amenorrhea.    She has been on Metformin once daily for a few months. Since being on them, she's had normal periods. She's had a period in December and January. They lasted about 4-5 days. She does report skipping a few days because she forgets to take them. She denies any side effects from them or anything. She does report some weight gain since last visit. She says she doesn't eat 3 meals a day.  She says she knows she needs to exercise more. She did have a recent visit to the ED due to a dog bite. She says it wasn't much of a bite but minimal bleeding did occur. Animal control was called along with the police officers. The dog has been tested and does not have rabies. She has received a tetanus shot in the last 10 years.     Review of Systems  Constitutional: Positive for unexpected weight change.  Genitourinary: Negative for menstrual problem.       Secondary amenorrhea resolved; now having monthly periods  Skin: Positive for wound.       Dog bite healing       Objective:   Physical Exam  Nursing note and vitals reviewed. Constitutional: She is oriented to person, place, and time.  Overweight AAF in NAD   HENT:  Head: Normocephalic and atraumatic.  Right Ear: External ear normal.  Left Ear: External ear normal.  Nose: Nose normal.  Eyes: Conjunctivae and EOM are normal.  Neck: Normal range of motion.  Neurological: She is alert and oriented to person, place, and time.  Skin: Skin is warm and dry.  Psychiatric: She has a normal mood and affect. Her behavior is normal. Judgment normal.       Assessment:      Gina Snow was seen today for follow-up.  Diagnoses and associated orders for this visit:  Severe obesity (BMI >= 40)  PCOS (polycystic ovarian syndrome)  Prediabetes  Dog bite of arm Comments:  resolving, been seen by ED MD for this 2 weeks ago    Plan:     Have advised of lifestyle changes and exercising at least 5 days a week, 30 minutes a day. Also to continue Metformin. When she was on this, she noticed a 5 pound weight loss. I have advised her to be more compliant with the medicine, to exercise and watch her diet. Will follow up in 3 months.  Have also advised her to finish the Augmentin she was given in the ED for her dog bite.

## 2013-07-12 ENCOUNTER — Encounter: Payer: Self-pay | Admitting: Pediatrics

## 2013-07-12 ENCOUNTER — Ambulatory Visit (INDEPENDENT_AMBULATORY_CARE_PROVIDER_SITE_OTHER): Payer: Medicaid Other | Admitting: Pediatrics

## 2013-07-12 ENCOUNTER — Other Ambulatory Visit: Payer: Self-pay | Admitting: Pediatrics

## 2013-07-12 VITALS — BP 122/80 | HR 72 | Temp 97.8°F | Resp 18 | Ht 64.0 in | Wt 210.8 lb

## 2013-07-12 DIAGNOSIS — R05 Cough: Secondary | ICD-10-CM

## 2013-07-12 DIAGNOSIS — J069 Acute upper respiratory infection, unspecified: Secondary | ICD-10-CM

## 2013-07-12 DIAGNOSIS — Z09 Encounter for follow-up examination after completed treatment for conditions other than malignant neoplasm: Secondary | ICD-10-CM

## 2013-07-12 DIAGNOSIS — E669 Obesity, unspecified: Secondary | ICD-10-CM

## 2013-07-12 DIAGNOSIS — R062 Wheezing: Secondary | ICD-10-CM

## 2013-07-12 DIAGNOSIS — R059 Cough, unspecified: Secondary | ICD-10-CM

## 2013-07-12 DIAGNOSIS — IMO0001 Reserved for inherently not codable concepts without codable children: Secondary | ICD-10-CM

## 2013-07-12 DIAGNOSIS — Z309 Encounter for contraceptive management, unspecified: Secondary | ICD-10-CM

## 2013-07-12 DIAGNOSIS — Z23 Encounter for immunization: Secondary | ICD-10-CM

## 2013-07-12 LAB — COMPREHENSIVE METABOLIC PANEL
ALBUMIN: 4.3 g/dL (ref 3.5–5.2)
ALK PHOS: 52 U/L (ref 39–117)
ALT: 11 U/L (ref 0–35)
AST: 13 U/L (ref 0–37)
BILIRUBIN TOTAL: 0.8 mg/dL (ref 0.2–1.2)
BUN: 10 mg/dL (ref 6–23)
CO2: 26 mEq/L (ref 19–32)
Calcium: 8.7 mg/dL (ref 8.4–10.5)
Chloride: 105 mEq/L (ref 96–112)
Creat: 0.81 mg/dL (ref 0.50–1.10)
Glucose, Bld: 90 mg/dL (ref 70–99)
Potassium: 3.9 mEq/L (ref 3.5–5.3)
SODIUM: 139 meq/L (ref 135–145)
TOTAL PROTEIN: 6.6 g/dL (ref 6.0–8.3)

## 2013-07-12 MED ORDER — ALBUTEROL SULFATE HFA 108 (90 BASE) MCG/ACT IN AERS: 2.0000 | INHALATION_SPRAY | RESPIRATORY_TRACT | Status: DC | PRN

## 2013-07-12 NOTE — Progress Notes (Signed)
Patient ID: Gina Snow, female   DOB: 02-Nov-1990, 23 y.o.   MRN: [id number]  Subjective:     Patient ID: Gina Snow, female   DOB: [birth year], 23 y.o.   MRN: [id number]  HPI: Pt is here for f/u weight. Today she also c/o URI symptoms that started 1 w ago. Initially some low grade temp. Now still congested and having a cough. She is not taking any OTC meds.   The pt smokes about 6-7 cigarettes a day. She has no h/o asthma or RAD as far as she can remember.  The pt has dropped from 240 lbs in Feb to 210 today. She has been exercising and eating healthy foods. Her Hgb A1C was borderline elevated and she has been taking Metformin. The weight loss has also helped with her irregular periods. She has been regular for the last 5 months or so. She is sexually active. Currently she has a female partner but has been with female partners as recently as Feb. She is not using contraception but uses condoms regularly. She denies any vaginal discahrge/ symptoms and has never been diagnosed with a STD. She usually receives routine gynaecologic care at the [facility]. She is not sure of she has had a papsmear before.    ROS:  Apart from the symptoms reviewed above, there are no other symptoms referable to all systems reviewed.   Physical Examination  Blood pressure 122/80, pulse 72, temperature 97.8 F (36.6 C), temperature source Temporal, resp. rate 18, height 5\' 4"  (1.626 m), weight 210 lb 12.8 oz (95.618 kg), last menstrual period 06/21/2013, SpO2 97.00%. General: Alert, NAD, active, appropriate affect, forthcoming. HEENT: TM's - clear, Throat - mild erythema, Neck - FROM, no meningismus, Sclera - clear, Nose with mild congestion. LYMPH NODES: No LN noted LUNGS: decreased air movement with prolonged expirations and diffuse rhonchii. CV: RRR without Murmurs SKIN: Clear, No rashes noted  No results found. No results found for this or any previous visit (from the past 240 hour(s)). No results found for  this or any previous visit (from the past 48 hour(s)).  Assessment:   Albuterol neb in office: significant improvement in air movement leading to diffuse b/l wheezing and rhonchii. Cough sounds bronchitic.  Wheezing: Smoker with recent URI.  Obesity/ prediabetes: significant improvement.  Sexually active and interested in contraception.  Plan:   Will give an inhaler to use Q4 hrs today and wean down as tolerated. Inhaler education. Discussed smoking cessation and harmful effects including new onset wheezing, risks of COPD. Encouraged continued lifestyle modifications/ weight loss. Will repeat labs as below. Refer to Gyn to establish care and discuss contraception. RTC in 3 days for f/u. Warning signs discussed. Go to ER if worsening over the weekend.  Meds ordered this encounter  Medications  . albuterol (PROVENTIL HFA;VENTOLIN HFA) 108 (90 BASE) MCG/ACT inhaler    Sig: Inhale 2 puffs into the lungs every 4 (four) hours as needed for wheezing or shortness of breath.    Dispense:  1 Inhaler    Refill:  1    Orders Placed This Encounter  Procedures  . Vit D  25 hydroxy (rtn osteoporosis monitoring)    Standing Status: Future     Number of Occurrences:      Standing Expiration Date: 07/13/2014  . Hemoglobin A1c    Standing Status: Future     Number of Occurrences:      Standing Expiration Date: 07/13/2014  . CBC with Differential    Standing  Status: Future     Number of Occurrences:      Standing Expiration Date: 07/13/2014  . Comprehensive metabolic panel    Standing Status: Future     Number of Occurrences:      Standing Expiration Date: 07/13/2014  . Ambulatory referral to Gynecology    Referral Priority:  Routine    Referral Type:  Consultation    Referral Reason:  Specialty Services Required    Requested Specialty:  Gynecology    Number of Visits Requested:  1  . PR INHAL RX, AIRWAY OBST/DX SPUTUM INDUCT

## 2013-07-12 NOTE — Patient Instructions (Addendum)
Upper Respiratory Infection, Adult An upper respiratory infection (URI) is also known as the common cold. It is often caused by a type of germ (virus). Colds are easily spread (contagious). You can pass it to others by kissing, coughing, sneezing, or drinking out of the same glass. Usually, you get better in 1 or 2 weeks.  HOME CARE   Only take medicine as told by your doctor.  Use a warm mist humidifier or breathe in steam from a hot shower.  Drink enough water and fluids to keep your pee (urine) clear or pale yellow.  Get plenty of rest.  Return to work when your temperature is back to normal or as told by your doctor. You may use a face mask and wash your hands to stop your cold from spreading. GET HELP RIGHT AWAY IF:   After the first few days, you feel you are getting worse.  You have questions about your medicine.  You have chills, shortness of breath, or brown or red spit (mucus).  You have yellow or brown snot (nasal discharge) or pain in the face, especially when you bend forward.  You have a fever, puffy (swollen) neck, pain when you swallow, or white spots in the back of your throat.  You have a bad headache, ear pain, sinus pain, or chest pain.  You have a high-pitched whistling sound when you breathe in and out (wheezing).  You have a lasting cough or cough up blood.  You have sore muscles or a stiff neck. MAKE SURE YOU:   Understand these instructions.  Will watch your condition.  Will get help right away if you are not doing well or get worse. Document Released: 07/20/2007 Document Revised: 04/25/2011 Document Reviewed: 06/07/2010 [facility] Patient Information 2014 [place], [place]. Smoking Cessation, Tips for Success If you are ready to quit smoking, congratulations! You have chosen to help yourself be healthier. Cigarettes bring nicotine, tar, carbon monoxide, and other irritants into your body. Your lungs, heart, and blood vessels will be able to work better  without these poisons. There are many different ways to quit smoking. Nicotine gum, nicotine patches, a nicotine inhaler, or nicotine nasal spray can help with physical craving. Hypnosis, support groups, and medicines help break the habit of smoking. WHAT THINGS CAN I DO TO MAKE QUITTING EASIER?  Here are some tips to help you quit for good:  Pick a date when you will quit smoking completely. Tell all of your friends and family about your plan to quit on that date.  Do not try to slowly cut down on the number of cigarettes you are smoking. Pick a quit date and quit smoking completely starting on that day.  Throw away all cigarettes.   Clean and remove all ashtrays from your home, work, and car.   On a card, write down your reasons for quitting. Carry the card with you and read it when you get the urge to smoke.   Cleanse your body of nicotine. Drink enough water and fluids to keep your urine clear or pale yellow. Do this after quitting to flush the nicotine from your body.   Learn to predict your moods. Do not let a bad situation be your excuse to have a cigarette. Some situations in your life might tempt you into wanting a cigarette.   Never have "just one" cigarette. It leads to wanting another and another. Remind yourself of your decision to quit.   Change habits associated with smoking. If you smoked while  driving or when feeling stressed, try other activities to replace smoking. Stand up when drinking your coffee. Brush your teeth after eating. Sit in a different chair when you read the paper. Avoid alcohol while trying to quit, and try to drink fewer caffeinated beverages. Alcohol and caffeine may urge you to smoke.   Avoid foods and drinks that can trigger a desire to smoke, such as sugary or spicy foods and alcohol.   Ask people who smoke not to smoke around you.   Have something planned to do right after eating or having a cup of coffee. For example, plan to take a walk or  exercise.   Try a relaxation exercise to calm you down and decrease your stress. Remember, you may be tense and nervous for the first 2 weeks after you quit, but this will pass.   Find new activities to keep your hands busy. Play with a pen, coin, or rubber band. Doodle or draw things on paper.   Brush your teeth right after eating. This will help cut down on the craving for the taste of tobacco after meals. You can also try mouthwash.   Use oral substitutes in place of cigarettes. Try using lemon drops, carrots, cinnamon sticks, or chewing gum. Keep them handy so they are available when you have the urge to smoke.   When you have the urge to smoke, try deep breathing.   Designate your home as a nonsmoking area.   If you are a heavy smoker, ask your health care provider about a prescription for nicotine chewing gum. It can ease your withdrawal from nicotine.   Reward yourself. Set aside the cigarette money you save and buy yourself something nice.   Look for support from others. Join a support group or smoking cessation program. Ask someone at home or at work to help you with your plan to quit smoking.   Always ask yourself, "Do I need this cigarette or is this just a reflex?" Tell yourself, "Today, I choose not to smoke," or "I do not want to smoke." You are reminding yourself of your decision to quit.  Do not replace cigarette smoking with electronic cigarettes (commonly called e-cigarettes). The safety of e-cigarettes is unknown, and some may contain harmful chemicals.  If you relapse, do not give up! Plan ahead and think about what you will do the next time you get the urge to smoke.  HOW WILL I FEEL WHEN I QUIT SMOKING? You may have symptoms of withdrawal because your body is used to nicotine (the addictive substance in cigarettes). You may crave cigarettes, be irritable, feel very hungry, cough often, get headaches, or have difficulty concentrating. The withdrawal symptoms  are only temporary. They are strongest when you first quit but will go away within 10 14 days. When withdrawal symptoms occur, stay in control. Think about your reasons for quitting. Remind yourself that these are signs that your body is healing and getting used to being without cigarettes. Remember that withdrawal symptoms are easier to treat than the major diseases that smoking can cause.  Even after the withdrawal is over, expect periodic urges to smoke. However, these cravings are generally short lived and will go away whether you smoke or not. Do not smoke!  WHAT RESOURCES ARE AVAILABLE TO HELP ME QUIT SMOKING? Your health care provider can direct you to community resources or hospitals for support, which may include:  Group support.  Education.  Hypnosis.  Therapy. Document Released: 10/30/2003 Document Revised: 11/21/2012  Document Reviewed: 07/19/2012 [facility] Patient Information 2014 [place]. Smoking Cessation Quitting smoking is important to your health and has many advantages. However, it is not always easy to quit since nicotine is a very addictive drug. Often times, people try 3 times or more before being able to quit. This document explains the best ways for you to prepare to quit smoking. Quitting takes hard work and a lot of effort, but you can do it. ADVANTAGES OF QUITTING SMOKING  You will live longer, feel better, and live better.  Your body will feel the impact of quitting smoking almost immediately.  Within 20 minutes, blood pressure decreases. Your pulse returns to its normal level.  After 8 hours, carbon monoxide levels in the blood return to normal. Your oxygen level increases.  After 24 hours, the chance of having a heart attack starts to decrease. Your breath, hair, and body stop smelling like smoke.  After 48 hours, damaged nerve endings begin to recover. Your sense of taste and smell improve.  After 72 hours, the body is virtually free of nicotine.  Your bronchial tubes relax and breathing becomes easier.  After 2 to 12 weeks, lungs can hold more air. Exercise becomes easier and circulation improves.  The risk of having a heart attack, stroke, cancer, or lung disease is greatly reduced.  After 1 year, the risk of coronary heart disease is cut in half.  After 5 years, the risk of stroke falls to the same as a nonsmoker.  After 10 years, the risk of lung cancer is cut in half and the risk of other cancers decreases significantly.  After 15 years, the risk of coronary heart disease drops, usually to the level of a nonsmoker.  If you are pregnant, quitting smoking will improve your chances of having a healthy baby.  The people you live with, especially any children, will be healthier.  You will have extra money to spend on things other than cigarettes. QUESTIONS TO THINK ABOUT BEFORE ATTEMPTING TO QUIT You may want to talk about your answers with your caregiver.  Why do you want to quit?  If you tried to quit in the past, what helped and what did not?  What will be the most difficult situations for you after you quit? How will you plan to handle them?  Who can help you through the tough times? Your family? Friends? A caregiver?  What pleasures do you get from smoking? What ways can you still get pleasure if you quit? Here are some questions to ask your caregiver:  How can you help me to be successful at quitting?  What medicine do you think would be best for me and how should I take it?  What should I do if I need more help?  What is smoking withdrawal like? How can I get information on withdrawal? GET READY  Set a quit date.  Change your environment by getting rid of all cigarettes, ashtrays, matches, and lighters in your home, car, or work. Do not let people smoke in your home.  Review your past attempts to quit. Think about what worked and what did not. GET SUPPORT AND ENCOURAGEMENT You have a better chance of  being successful if you have help. You can get support in many ways.  Tell your family, friends, and co-workers that you are going to quit and need their support. Ask them not to smoke around you.  Get individual, group, or telephone counseling and support. Programs are available at local  hospitals and health centers. Call your local health department for information about programs in your area.  Spiritual beliefs and practices may help some smokers quit.  Download a "quit meter" on your computer to keep track of quit statistics, such as how long you have gone without smoking, cigarettes not smoked, and money saved.  Get a self-help book about quitting smoking and staying off of tobacco. [place] yourself from urges to smoke. Talk to someone, go for a walk, or occupy your time with a task.  Change your normal routine. Take a different route to work. Drink tea instead of coffee. Eat breakfast in a different place.  Reduce your stress. Take a hot bath, exercise, or read a book.  Plan something enjoyable to do every day. Reward yourself for not smoking.  Explore interactive web-based programs that specialize in helping you quit. GET MEDICINE AND USE IT CORRECTLY Medicines can help you stop smoking and decrease the urge to smoke. Combining medicine with the above behavioral methods and support can greatly increase your chances of successfully quitting smoking.  Nicotine replacement therapy helps deliver nicotine to your body without the negative effects and risks of smoking. Nicotine replacement therapy includes nicotine gum, lozenges, inhalers, nasal sprays, and skin patches. Some may be available over-the-counter and others require a prescription.  Antidepressant medicine helps people abstain from smoking, but how this works is unknown. This medicine is available by prescription.  Nicotinic receptor partial agonist medicine simulates the effect of nicotine  in your brain. This medicine is available by prescription. Ask your caregiver for advice about which medicines to use and how to use them based on your health history. Your caregiver will tell you what side effects to look out for if you choose to be on a medicine or therapy. Carefully read the information on the package. Do not use any other product containing nicotine while using a nicotine replacement product.  RELAPSE OR DIFFICULT SITUATIONS Most relapses occur within the first 3 months after quitting. Do not be discouraged if you start smoking again. Remember, most people try several times before finally quitting. You may have symptoms of withdrawal because your body is used to nicotine. You may crave cigarettes, be irritable, feel very hungry, cough often, get headaches, or have difficulty concentrating. The withdrawal symptoms are only temporary. They are strongest when you first quit, but they will go away within 10 14 days. To reduce the chances of relapse, try to:  Avoid drinking alcohol. Drinking lowers your chances of successfully quitting.  Reduce the amount of caffeine you consume. Once you quit smoking, the amount of caffeine in your body increases and can give you symptoms, such as a rapid heartbeat, sweating, and anxiety.  Avoid smokers because they can make you want to smoke.  Do not let weight gain distract you. Many smokers will gain weight when they quit, usually less than 10 pounds. Eat a healthy diet and stay active. You can always lose the weight gained after you quit.  Find ways to improve your mood other than smoking. FOR MORE INFORMATION  www.smokefree.gov  Document Released: 01/25/2001 Document Revised: 08/02/2011 Document Reviewed: 05/12/2011 [facility] Patient Information 2014 [place], [place].

## 2013-07-13 LAB — CBC WITH DIFFERENTIAL/PLATELET
Basophils Absolute: 0 10*3/uL (ref 0.0–0.1)
Basophils Relative: 0 % (ref 0–1)
Eosinophils Absolute: 0.2 10*3/uL (ref 0.0–0.7)
Eosinophils Relative: 2 % (ref 0–5)
HCT: 40.7 % (ref 36.0–46.0)
Hemoglobin: 13.4 g/dL (ref 12.0–15.0)
Lymphocytes Relative: 20 % (ref 12–46)
Lymphs Abs: 1.9 10*3/uL (ref 0.7–4.0)
MCH: 30.5 pg (ref 26.0–34.0)
MCHC: 32.9 g/dL (ref 30.0–36.0)
MCV: 92.5 fL (ref 78.0–100.0)
Monocytes Absolute: 0.7 10*3/uL (ref 0.1–1.0)
Monocytes Relative: 8 % (ref 3–12)
Neutro Abs: 6.5 10*3/uL (ref 1.7–7.7)
Neutrophils Relative %: 70 % (ref 43–77)
Platelets: 250 10*3/uL (ref 150–400)
RBC: 4.4 MIL/uL (ref 3.87–5.11)
RDW: 14.2 % (ref 11.5–15.5)
WBC: 9.3 10*3/uL (ref 4.0–10.5)

## 2013-07-13 LAB — HEMOGLOBIN A1C
Hgb A1c MFr Bld: 5.6 % (ref <5.7)
Mean Plasma Glucose: 114 mg/dL (ref <117)

## 2013-07-13 LAB — VITAMIN D 25 HYDROXY (VIT D DEFICIENCY, FRACTURES): Vit D, 25-Hydroxy: 21 ng/mL — ABNORMAL LOW (ref 30–89)

## 2013-07-15 ENCOUNTER — Other Ambulatory Visit: Payer: Self-pay | Admitting: Pediatrics

## 2013-07-15 ENCOUNTER — Encounter: Payer: Self-pay | Admitting: Pediatrics

## 2013-07-15 DIAGNOSIS — E559 Vitamin D deficiency, unspecified: Secondary | ICD-10-CM

## 2013-07-15 MED ORDER — VITAMIN D (ERGOCALCIFEROL) 1.25 MG (50000 UNIT) PO CAPS: 50000.0000 [IU] | ORAL_CAPSULE | ORAL | Status: DC

## 2013-07-16 ENCOUNTER — Ambulatory Visit (INDEPENDENT_AMBULATORY_CARE_PROVIDER_SITE_OTHER): Payer: Medicaid Other | Admitting: Pediatrics

## 2013-07-16 ENCOUNTER — Encounter: Payer: Self-pay | Admitting: Pediatrics

## 2013-07-16 ENCOUNTER — Ambulatory Visit (HOSPITAL_COMMUNITY)
Admission: RE | Admit: 2013-07-16 | Discharge: 2013-07-16 | Disposition: A | Discharge status: Home / Self Care | Payer: Medicaid Other | Source: Ambulatory Visit | Attending: Pediatrics | Admitting: Pediatrics

## 2013-07-16 VITALS — BP 110/70 | HR 70 | Temp 97.6°F | Resp 18 | Ht 64.0 in | Wt 208.8 lb

## 2013-07-16 DIAGNOSIS — R062 Wheezing: Secondary | ICD-10-CM

## 2013-07-16 DIAGNOSIS — Z09 Encounter for follow-up examination after completed treatment for conditions other than malignant neoplasm: Secondary | ICD-10-CM

## 2013-07-16 DIAGNOSIS — F17200 Nicotine dependence, unspecified, uncomplicated: Secondary | ICD-10-CM

## 2013-07-16 DIAGNOSIS — Z720 Tobacco use: Secondary | ICD-10-CM

## 2013-07-16 MED ORDER — NICOTINE 14 MG/24HR TD PT24: MEDICATED_PATCH | TRANSDERMAL | Status: DC

## 2013-07-16 MED ORDER — PREDNISONE 20 MG PO TABS: 60.0000 mg | ORAL_TABLET | Freq: Every day | ORAL | Status: DC

## 2013-07-16 NOTE — Progress Notes (Signed)
Patient ID: Gina Snow, female   DOB: 12-11-1990, 23 y.o.   MRN: [id number]  Subjective:     Patient ID: Gina Snow, female   DOB: 10-23-1990, 23 y.o.   MRN: [id number]  HPI: Here for f/u wheezing. The pt was here a few days ago with URI symptoms and cough. She was found to be wheezing. This was her first episode, as far as she knows. She was given an inhaler and told to use it Q3 hrs and wean down. She did not pick it up till next day and has been using it about 6 times a day. Today she states that she feels better, but still some sob. No fevers. URI symptoms resolved.  The pt is a smoker. Started at age 63 y and smokes about 8 cigarettes a day. At last visit she was instructed to stop while sick, but says she continued to smoke, although less than usual. She is interested in quitting.   She is trying to lose weight and has lost about 30 lbs in the last few months, intentionally. Denies binging or purging activities. She is on Metformin for prediabetes. Takes inconsistently. Labs done last week showed normal A1C and Hgb. Vitamin D is lower than last labs.  LMP started yesterday. She has been referred to [facility] for contraception. There is a family h/o clots. Her mother died at age 65 of a "brain clot".    ROS:  Apart from the symptoms reviewed above, there are no other symptoms referable to all systems reviewed.   Physical Examination  Blood pressure 110/70, pulse 70, temperature 97.6 F (36.4 C), temperature source Temporal, resp. rate 18, height 5\' 4"  (1.626 m), weight 208 lb 12.8 oz (94.711 kg), last menstrual period 07/16/2013, SpO2 98.00%. General: Alert, NAD HEENT: TM's - clear, Throat - clear, Neck - FROM, no meningismus, Sclera - clear LYMPH NODES: No LN noted LUNGS: mild decreased air movement with diffuse end exp wheezing. CV: RRR without Murmurs  No results found. No results found for this or any previous visit (from the past 240 hour(s)). No results found for this or  any previous visit (from the past 48 hour(s)).  Assessment:   Albuterol neb in office with significant improvement in air movement and wheezing.  Unresolved wheezing in a smoker 2ry to recent URI.  Plan:   CXR to be done today. Will start Prednisone x 5 days. Continue albuterol Q3-4 hrs today and wean down as tolerated. Discussed Nicotine patch with pt and she is willing to try it. Again talked about harms of smoking and importance of stopping. Start Vitamin D supplement. F/u with Gyn. Will not give OCPs today due to history of clots in mom and smoking. RTC in 3 days for f/u.  Current outpatient prescriptions:albuterol (PROVENTIL HFA;VENTOLIN HFA) 108 (90 BASE) MCG/ACT inhaler, Inhale 2 puffs into the lungs every 4 (four) hours as needed for wheezing or shortness of breath., Disp: 1 Inhaler, Rfl: 1;  metFORMIN (GLUCOPHAGE) 500 MG tablet, Take 1 tablet (500 mg total) by mouth daily with breakfast., Disp: 30 tablet, Rfl: 2 nicotine (NICODERM CQ - DOSED IN MG/24 HOURS) 14 mg/24hr patch, Apply 1 patch to skin daily for 6 weeks., Disp: 28 patch, Rfl: 5;  predniSONE (DELTASONE) 20 MG tablet, Take 3 tablets (60 mg total) by mouth daily with breakfast., Disp: 15 tablet, Rfl: 0;  Vitamin D, Ergocalciferol, (DRISDOL) 50000 UNITS CAPS capsule, Take 1 capsule (50,000 Units total) by mouth every 7 (seven) days., Disp: 6  capsule, Rfl: 0  Orders Placed This Encounter  Procedures  . DG Chest 2 View    Standing Status: Future     Number of Occurrences: 1     Standing Expiration Date: 09/16/2014    Order Specific Question:  Reason for Exam (SYMPTOM  OR DIAGNOSIS REQUIRED)    Answer:  first time wheezing    Order Specific Question:  Is the patient pregnant?    Answer:  No    Order Specific Question:  Preferred imaging location?    Answer:  [facility]

## 2013-07-16 NOTE — Addendum Note (Signed)
Addended by: Marquis Buggy on: 07/16/2013 10:03 AM   Modules accepted: Orders

## 2013-07-16 NOTE — Patient Instructions (Signed)
Smoking Cessation Quitting smoking is important to your health and has many advantages. However, it is not always easy to quit since nicotine is a very addictive drug. Often times, people try 3 times or more before being able to quit. This document explains the best ways for you to prepare to quit smoking. Quitting takes hard work and a lot of effort, but you can do it. ADVANTAGES OF QUITTING SMOKING  You will live longer, feel better, and live better.  Your body will feel the impact of quitting smoking almost immediately.  Within 20 minutes, blood pressure decreases. Your pulse returns to its normal level.  After 8 hours, carbon monoxide levels in the blood return to normal. Your oxygen level increases.  After 24 hours, the chance of having a heart attack starts to decrease. Your breath, hair, and body stop smelling like smoke.  After 48 hours, damaged nerve endings begin to recover. Your sense of taste and smell improve.  After 72 hours, the body is virtually free of nicotine. Your bronchial tubes relax and breathing becomes easier.  After 2 to 12 weeks, lungs can hold more air. Exercise becomes easier and circulation improves.  The risk of having a heart attack, stroke, cancer, or lung disease is greatly reduced.  After 1 year, the risk of coronary heart disease is cut in half.  After 5 years, the risk of stroke falls to the same as a nonsmoker.  After 10 years, the risk of lung cancer is cut in half and the risk of other cancers decreases significantly.  After 15 years, the risk of coronary heart disease drops, usually to the level of a nonsmoker.  If you are pregnant, quitting smoking will improve your chances of having a healthy baby.  The people you live with, especially any children, will be healthier.  You will have extra money to spend on things other than cigarettes. QUESTIONS TO THINK ABOUT BEFORE ATTEMPTING TO QUIT You may want to talk about your answers with your  caregiver.  Why do you want to quit?  If you tried to quit in the past, what helped and what did not?  What will be the most difficult situations for you after you quit? How will you plan to handle them?  Who can help you through the tough times? Your family? Friends? A caregiver?  What pleasures do you get from smoking? What ways can you still get pleasure if you quit? Here are some questions to ask your caregiver:  How can you help me to be successful at quitting?  What medicine do you think would be best for me and how should I take it?  What should I do if I need more help?  What is smoking withdrawal like? How can I get information on withdrawal? GET READY  Set a quit date.  Change your environment by getting rid of all cigarettes, ashtrays, matches, and lighters in your home, car, or work. Do not let people smoke in your home.  Review your past attempts to quit. Think about what worked and what did not. GET SUPPORT AND ENCOURAGEMENT You have a better chance of being successful if you have help. You can get support in many ways.  Tell your family, friends, and co-workers that you are going to quit and need their support. Ask them not to smoke around you.  Get individual, group, or telephone counseling and support. Programs are available at local hospitals and health centers. Call your local health department for   information about programs in your area.  Spiritual beliefs and practices may help some smokers quit.  Download a "quit meter" on your computer to keep track of quit statistics, such as how long you have gone without smoking, cigarettes not smoked, and money saved.  Get a self-help book about quitting smoking and staying off of tobacco. LEARN NEW SKILLS AND BEHAVIORS  Distract yourself from urges to smoke. Talk to someone, go for a walk, or occupy your time with a task.  Change your normal routine. Take a different route to work. Drink tea instead of coffee.  Eat breakfast in a different place.  Reduce your stress. Take a hot bath, exercise, or read a book.  Plan something enjoyable to do every day. Reward yourself for not smoking.  Explore interactive web-based programs that specialize in helping you quit. GET MEDICINE AND USE IT CORRECTLY Medicines can help you stop smoking and decrease the urge to smoke. Combining medicine with the above behavioral methods and support can greatly increase your chances of successfully quitting smoking.  Nicotine replacement therapy helps deliver nicotine to your body without the negative effects and risks of smoking. Nicotine replacement therapy includes nicotine gum, lozenges, inhalers, nasal sprays, and skin patches. Some may be available over-the-counter and others require a prescription.  Antidepressant medicine helps people abstain from smoking, but how this works is unknown. This medicine is available by prescription.  Nicotinic receptor partial agonist medicine simulates the effect of nicotine in your brain. This medicine is available by prescription. Ask your caregiver for advice about which medicines to use and how to use them based on your health history. Your caregiver will tell you what side effects to look out for if you choose to be on a medicine or therapy. Carefully read the information on the package. Do not use any other product containing nicotine while using a nicotine replacement product.  RELAPSE OR DIFFICULT SITUATIONS Most relapses occur within the first 3 months after quitting. Do not be discouraged if you start smoking again. Remember, most people try several times before finally quitting. You may have symptoms of withdrawal because your body is used to nicotine. You may crave cigarettes, be irritable, feel very hungry, cough often, get headaches, or have difficulty concentrating. The withdrawal symptoms are only temporary. They are strongest when you first quit, but they will go away within  10 14 days. To reduce the chances of relapse, try to:  Avoid drinking alcohol. Drinking lowers your chances of successfully quitting.  Reduce the amount of caffeine you consume. Once you quit smoking, the amount of caffeine in your body increases and can give you symptoms, such as a rapid heartbeat, sweating, and anxiety.  Avoid smokers because they can make you want to smoke.  Do not let weight gain distract you. Many smokers will gain weight when they quit, usually less than 10 pounds. Eat a healthy diet and stay active. You can always lose the weight gained after you quit.  Find ways to improve your mood other than smoking. FOR MORE INFORMATION  www.smokefree.gov  Document Released: 01/25/2001 Document Revised: 08/02/2011 Document Reviewed: 05/12/2011 [employer] Patient Information 2014 [employer].  

## 2013-07-19 ENCOUNTER — Ambulatory Visit: Payer: Medicaid Other | Admitting: Pediatrics

## 2013-07-23 ENCOUNTER — Ambulatory Visit: Payer: Medicaid Other | Admitting: Pediatrics

## 2013-07-30 ENCOUNTER — Encounter: Payer: Medicaid Other | Admitting: Advanced Practice Midwife

## 2013-07-31 ENCOUNTER — Ambulatory Visit (INDEPENDENT_AMBULATORY_CARE_PROVIDER_SITE_OTHER): Payer: Medicaid Other | Admitting: Adult Health

## 2013-07-31 ENCOUNTER — Encounter: Payer: Self-pay | Admitting: Adult Health

## 2013-07-31 VITALS — BP 110/72 | Ht 64.0 in | Wt 218.0 lb

## 2013-07-31 DIAGNOSIS — Z8742 Personal history of other diseases of the female genital tract: Secondary | ICD-10-CM

## 2013-07-31 DIAGNOSIS — N926 Irregular menstruation, unspecified: Secondary | ICD-10-CM

## 2013-07-31 DIAGNOSIS — Z309 Encounter for contraceptive management, unspecified: Secondary | ICD-10-CM

## 2013-07-31 NOTE — Progress Notes (Signed)
Subjective:     Patient ID: Gina Snow, female   DOB: [birth year], 23 y.o.   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  Review of Systems See HPI Reviewed past medical,surgical, social and family history. Reviewed medications and allergies.     Objective:   Physical Exam BP 110/72  Ht 5\' 4"  (1.626 m)  Wt 218 lb (98.884 kg)  BMI 37.40 kg/m2  LMP 07/16/2013   Skin warm and dry. Neck: mid line trachea, normal thyroid. Lungs: clear to ausculation bilaterally. Cardiovascular: regular rate and rhythm. Discussed nexplanon and she wants to get it, will not remember to take pill,was supposed to get [place] and never did, so I will get one in this office.  Assessment:     Irregular periods History PCO Contraceptive management    Plan:     Order nexplanon Return in 2 weeks for [place] and see me to assess ?PCO And return in 4 weeks for stat [facility] in am and nexplanon insertion in pm, No sex 2 weeks prior to this date not even with condom Review handout on nexplanon and PCO

## 2013-07-31 NOTE — Patient Instructions (Signed)
Polycystic Ovarian Syndrome Polycystic ovarian syndrome (PCOS) is a common hormonal disorder among women of reproductive age. Most women with PCOS grow many small cysts on their ovaries. PCOS can cause problems with your periods and make it difficult to get pregnant. It can also cause an increased risk of miscarriage with pregnancy. If left untreated, PCOS can lead to serious health problems, such as diabetes and heart disease. CAUSES The cause of PCOS is not fully understood, but genetics may be a factor. SIGNS AND SYMPTOMS   Infrequent or no menstrual periods.   Inability to get pregnant (infertility) because of not ovulating.   Increased growth of hair on the face, chest, stomach, back, thumbs, thighs, or toes.   Acne, oily skin, or dandruff.   Pelvic pain.   Weight gain or obesity, usually carrying extra weight around the waist.   Type 2 diabetes.   High cholesterol.   High blood pressure.   Female-pattern baldness or thinning hair.   Patches of thickened and dark brown or black skin on the neck, arms, breasts, or thighs.   Tiny excess flaps of skin (skin tags) in the armpits or neck area.   Excessive snoring and having breathing stop at times while asleep (sleep apnea).   Deepening of the voice.   Gestational diabetes when pregnant.  DIAGNOSIS  There is no single test to diagnose PCOS.   Your health care provider will:   Take a medical history.   Perform a pelvic exam.   Have ultrasonography done.   Check your female and female hormone levels.   Measure glucose or sugar levels in the blood.   Do other blood tests.   If you are producing too many female hormones, your health care provider will make sure it is from PCOS. At the physical exam, your health care provider will want to evaluate the areas of increased hair growth. Try to allow natural hair growth for a few days before the visit.   During a pelvic exam, the ovaries may be enlarged  or swollen because of the increased number of small cysts. This can be seen more easily by using vaginal ultrasonography or screening to examine the ovaries and lining of the uterus (endometrium) for cysts. The uterine lining may become thicker if you have not been having a regular period.  TREATMENT  Because there is no cure for PCOS, it needs to be managed to prevent problems. Treatments are based on your symptoms. Treatment is also based on whether you want to have a baby or whether you need contraception.  Treatment may include:   Progesterone hormone to start a menstrual period.   Birth control pills to make you have regular menstrual periods.   Medicines to make you ovulate, if you want to get pregnant.   Medicines to control your insulin.   Medicine to control your blood pressure.   Medicine and diet to control your high cholesterol and triglycerides in your blood.  Medicine to reduce excessive hair growth.  Surgery, making small holes in the ovary, to decrease the amount of female hormone production. This is done through a long, lighted tube (laparoscope) placed into the pelvis through a tiny incision in the lower abdomen.  HOME CARE INSTRUCTIONS  Only take over-the-counter or prescription medicine as directed by your health care provider.  Pay attention to the foods you eat and your activity levels. This can help reduce the effects of PCOS.  Keep your weight under control.  Eat foods that are   low in carbohydrate and high in fiber.  Exercise regularly. SEEK MEDICAL CARE IF:  Your symptoms do not get better with medicine.  You have new symptoms. Document Released: 05/27/2004 Document Revised: 11/21/2012 Document Reviewed: 07/19/2012 [facility] Patient Information 2015 [place], [place]. This information is not intended to replace advice given to you by your health care provider. Make sure you discuss any questions you have with your health care provider. Etonogestrel  implant What is this medicine? ETONOGESTREL (et oh noe JES trel) is a contraceptive (birth control) device. It is used to prevent pregnancy. It can be used for up to 3 years. This medicine may be used for other purposes; ask your health care provider or pharmacist if you have questions. COMMON BRAND NAME(S): Implanon, Nexplanon  What should I tell my health care provider before I take this medicine? They need to know if you have any of these conditions: -abnormal vaginal bleeding -blood vessel disease or blood clots -cancer of the breast, cervix, or liver -depression -diabetes -gallbladder disease -headaches -heart disease or recent heart attack -high blood pressure -high cholesterol -kidney disease -liver disease -renal disease -seizures -tobacco smoker -an unusual or allergic reaction to etonogestrel, other hormones, anesthetics or antiseptics, medicines, foods, dyes, or preservatives -pregnant or trying to get pregnant -breast-feeding How should I use this medicine? This device is inserted just under the skin on the inner side of your upper arm by a health care professional. Talk to your pediatrician regarding the use of this medicine in children. Special care may be needed. Overdosage: If you think you've taken too much of this medicine contact a poison control center or emergency room at once. Overdosage: If you think you have taken too much of this medicine contact a poison control center or emergency room at once. NOTE: This medicine is only for you. Do not share this medicine with others. What if I miss a dose? This does not apply. What may interact with this medicine? Do not take this medicine with any of the following medications: -amprenavir -bosentan -fosamprenavir This medicine may also interact with the following medications: -barbiturate medicines for inducing sleep or treating seizures -certain medicines for fungal infections like ketoconazole and  itraconazole -griseofulvin -medicines to treat seizures like carbamazepine, felbamate, oxcarbazepine, phenytoin, topiramate -modafinil -phenylbutazone -rifampin -some medicines to treat HIV infection like atazanavir, indinavir, lopinavir, nelfinavir, tipranavir, ritonavir -St. John's wort This list may not describe all possible interactions. Give your health care provider a list of all the medicines, herbs, non-prescription drugs, or dietary supplements you use. Also tell them if you smoke, drink alcohol, or use illegal drugs. Some items may interact with your medicine. What should I watch for while using this medicine? This product does not protect you against HIV infection (AIDS) or other sexually transmitted diseases. You should be able to feel the implant by pressing your fingertips over the skin where it was inserted. Tell your doctor if you cannot feel the implant. What side effects may I notice from receiving this medicine? Side effects that you should report to your doctor or health care professional as soon as possible: -allergic reactions like skin rash, itching or hives, swelling of the face, lips, or tongue -breast lumps -changes in vision -confusion, trouble speaking or understanding -dark urine -depressed mood -general ill feeling or flu-like symptoms -light-colored stools -loss of appetite, nausea -right upper belly pain -severe headaches -severe pain, swelling, or tenderness in the abdomen -shortness of breath, chest pain, swelling in a leg -signs of pregnancy -  sudden numbness or weakness of the face, arm or leg -trouble walking, dizziness, loss of balance or coordination -unusual vaginal bleeding, discharge -unusually weak or tired -yellowing of the eyes or skin Side effects that usually do not require medical attention (Report these to your doctor or health care professional if they continue or are bothersome.): -acne -breast pain -changes in  weight -cough -fever or chills -headache -irregular menstrual bleeding -itching, burning, and vaginal discharge -pain or difficulty passing urine -sore throat This list may not describe all possible side effects. Call your doctor for medical advice about side effects. You may report side effects to FDA at 1-800-FDA-1088. Where should I keep my medicine? This drug is given in a hospital or clinic and will not be stored at home. NOTE: This sheet is a summary. It may not cover all possible information. If you have questions about this medicine, talk to your doctor, pharmacist, or health care provider.  2014, [employer]vier/[employer]. (2011-08-08 15:37:45) Return in 2 weeks for [place] NO sex for 2 weeks prior to nexplanon insertion, return in 4 weeks for blood work and then in pm nexplanon insertion

## 2013-08-15 ENCOUNTER — Ambulatory Visit (INDEPENDENT_AMBULATORY_CARE_PROVIDER_SITE_OTHER): Payer: Medicaid Other | Admitting: Adult Health

## 2013-08-15 ENCOUNTER — Encounter: Payer: Self-pay | Admitting: Adult Health

## 2013-08-15 ENCOUNTER — Ambulatory Visit (INDEPENDENT_AMBULATORY_CARE_PROVIDER_SITE_OTHER): Payer: Medicaid Other

## 2013-08-15 VITALS — BP 112/78 | Ht 64.0 in | Wt 210.5 lb

## 2013-08-15 DIAGNOSIS — E282 Polycystic ovarian syndrome: Secondary | ICD-10-CM

## 2013-08-15 DIAGNOSIS — Z8742 Personal history of other diseases of the female genital tract: Secondary | ICD-10-CM

## 2013-08-15 DIAGNOSIS — N926 Irregular menstruation, unspecified: Secondary | ICD-10-CM

## 2013-08-15 NOTE — Progress Notes (Signed)
Subjective:     Patient ID: Gina Snow, female   DOB: [birth year], 23 y.o.   MRN: [id number]  HPI Gina Snow is a 23 year old black female in for [place] to assess ovaries for PCO.No complaints today.  Review of Systems See HPI Reviewed past medical,surgical, social and family history. Reviewed medications and allergies.     Objective:   Physical Exam BP 112/78  Ht 5\' 4"  (1.626 m)  Wt 210 lb 8 oz (95.482 kg)  BMI 36.11 kg/m2  LMP 06/02/2015Reviewed [place] with pt.   Uterus 7.3 x 5.8 x 4.1 cm, anteverted  Endometrium 8.2 mm, symmetrical,  Right ovary 3.6 x 2.4 x 1.9 cm, (PCO appearance) slight enlargement noted  Left ovary 3.9 x 3.0 x 2.2 cm, (PCO appearance) slight enlargement noted  No free fluid or adnexal masses noted wtihin the pelvis  Technician Comments:  Anteverted uterus, symmetrical endometrium, Bilateral slight ovarian enlargement (?PCO appearance), no free fluid or adnexal masses noted within the pelvis  Pt still wants nexplanon.   Assessment:     PCO    Plan:     Review handout on PCO NO sex  Return 7/15 for nexplanon insertion Continue metformin and decrease smoking

## 2013-08-15 NOTE — Patient Instructions (Signed)
Polycystic Ovarian Syndrome Polycystic ovarian syndrome (PCOS) is a common hormonal disorder among women of reproductive age. Most women with PCOS grow many small cysts on their ovaries. PCOS can cause problems with your periods and make it difficult to get pregnant. It can also cause an increased risk of miscarriage with pregnancy. If left untreated, PCOS can lead to serious health problems, such as diabetes and heart disease. CAUSES The cause of PCOS is not fully understood, but genetics may be a factor. SIGNS AND SYMPTOMS   Infrequent or no menstrual periods.   Inability to get pregnant (infertility) because of not ovulating.   Increased growth of hair on the face, chest, stomach, back, thumbs, thighs, or toes.   Acne, oily skin, or dandruff.   Pelvic pain.   Weight gain or obesity, usually carrying extra weight around the waist.   Type 2 diabetes.   High cholesterol.   High blood pressure.   Female-pattern baldness or thinning hair.   Patches of thickened and dark brown or black skin on the neck, arms, breasts, or thighs.   Tiny excess flaps of skin (skin tags) in the armpits or neck area.   Excessive snoring and having breathing stop at times while asleep (sleep apnea).   Deepening of the voice.   Gestational diabetes when pregnant.  DIAGNOSIS  There is no single test to diagnose PCOS.   Your health care provider will:   Take a medical history.   Perform a pelvic exam.   Have ultrasonography done.   Check your female and female hormone levels.   Measure glucose or sugar levels in the blood.   Do other blood tests.   If you are producing too many female hormones, your health care provider will make sure it is from PCOS. At the physical exam, your health care provider will want to evaluate the areas of increased hair growth. Try to allow natural hair growth for a few days before the visit.   During a pelvic exam, the ovaries may be enlarged  or swollen because of the increased number of small cysts. This can be seen more easily by using vaginal ultrasonography or screening to examine the ovaries and lining of the uterus (endometrium) for cysts. The uterine lining may become thicker if you have not been having a regular period.  TREATMENT  Because there is no cure for PCOS, it needs to be managed to prevent problems. Treatments are based on your symptoms. Treatment is also based on whether you want to have a baby or whether you need contraception.  Treatment may include:   Progesterone hormone to start a menstrual period.   Birth control pills to make you have regular menstrual periods.   Medicines to make you ovulate, if you want to get pregnant.   Medicines to control your insulin.   Medicine to control your blood pressure.   Medicine and diet to control your high cholesterol and triglycerides in your blood.  Medicine to reduce excessive hair growth.  Surgery, making small holes in the ovary, to decrease the amount of female hormone production. This is done through a long, lighted tube (laparoscope) placed into the pelvis through a tiny incision in the lower abdomen.  HOME CARE INSTRUCTIONS  Only take over-the-counter or prescription medicine as directed by your health care provider.  Pay attention to the foods you eat and your activity levels. This can help reduce the effects of PCOS.  Keep your weight under control.  Eat foods that are   low in carbohydrate and high in fiber.  Exercise regularly. SEEK MEDICAL CARE IF:  Your symptoms do not get better with medicine.  You have new symptoms. Document Released: 05/27/2004 Document Revised: 11/21/2012 Document Reviewed: 07/19/2012 [facility] Patient Information 2015 [place], [place]. This information is not intended to replace advice given to you by your health care provider. Make sure you discuss any questions you have with your health care provider. NO SEX return  7/15 as scheduled

## 2013-08-28 ENCOUNTER — Encounter: Payer: Medicaid Other | Admitting: Adult Health

## 2013-08-28 ENCOUNTER — Other Ambulatory Visit: Payer: Medicaid Other

## 2013-08-28 DIAGNOSIS — Z3200 Encounter for pregnancy test, result unknown: Secondary | ICD-10-CM

## 2013-08-28 LAB — HCG, QUANTITATIVE, PREGNANCY

## 2013-09-05 ENCOUNTER — Encounter: Payer: Medicaid Other | Admitting: Advanced Practice Midwife

## 2013-09-05 ENCOUNTER — Other Ambulatory Visit: Payer: Medicaid Other

## 2013-11-05 ENCOUNTER — Telehealth: Payer: Self-pay | Admitting: *Deleted

## 2013-11-05 NOTE — Telephone Encounter (Signed)
Fax received from [facility] requesting  Refill on vitamin  D (ergo)50,00IUcap #6. Take 1 cap, once a wk.  Total of 3 refills granted per Dr. Hollace Kinnier. Also a refill request for Metformin HCL 500 mg tabs. # 30. Total of 4 refills granted per Dr. Hollace Kinnier. knl

## 2015-01-05 ENCOUNTER — Encounter (HOSPITAL_COMMUNITY): Payer: Self-pay

## 2015-01-05 ENCOUNTER — Emergency Department (HOSPITAL_COMMUNITY)
Admission: EM | Admit: 2015-01-05 | Discharge: 2015-01-05 | Disposition: A | Discharge status: Home / Self Care | Payer: Medicaid Other | Attending: Emergency Medicine | Admitting: Emergency Medicine

## 2015-01-05 DIAGNOSIS — Y9289 Other specified places as the place of occurrence of the external cause: Secondary | ICD-10-CM | POA: Insufficient documentation

## 2015-01-05 DIAGNOSIS — R519 Headache, unspecified: Secondary | ICD-10-CM

## 2015-01-05 DIAGNOSIS — Z9114 Patient's other noncompliance with medication regimen: Secondary | ICD-10-CM

## 2015-01-05 DIAGNOSIS — E669 Obesity, unspecified: Secondary | ICD-10-CM | POA: Insufficient documentation

## 2015-01-05 DIAGNOSIS — Y9389 Activity, other specified: Secondary | ICD-10-CM | POA: Insufficient documentation

## 2015-01-05 DIAGNOSIS — Z79899 Other long term (current) drug therapy: Secondary | ICD-10-CM | POA: Insufficient documentation

## 2015-01-05 DIAGNOSIS — Z7952 Long term (current) use of systemic steroids: Secondary | ICD-10-CM | POA: Insufficient documentation

## 2015-01-05 DIAGNOSIS — T391X1A Poisoning by 4-Aminophenol derivatives, accidental (unintentional), initial encounter: Secondary | ICD-10-CM | POA: Insufficient documentation

## 2015-01-05 DIAGNOSIS — Y998 Other external cause status: Secondary | ICD-10-CM | POA: Insufficient documentation

## 2015-01-05 DIAGNOSIS — J45909 Unspecified asthma, uncomplicated: Secondary | ICD-10-CM | POA: Insufficient documentation

## 2015-01-05 DIAGNOSIS — F17210 Nicotine dependence, cigarettes, uncomplicated: Secondary | ICD-10-CM | POA: Insufficient documentation

## 2015-01-05 DIAGNOSIS — R51 Headache: Secondary | ICD-10-CM | POA: Insufficient documentation

## 2015-01-05 DIAGNOSIS — R0789 Other chest pain: Secondary | ICD-10-CM | POA: Insufficient documentation

## 2015-01-05 LAB — PROTIME-INR
INR: 1.07 (ref 0.00–1.49)
Prothrombin Time: 14.1 seconds (ref 11.6–15.2)

## 2015-01-05 LAB — COMPREHENSIVE METABOLIC PANEL
ALT: 18 U/L (ref 14–54)
ANION GAP: 9 (ref 5–15)
AST: 17 U/L (ref 15–41)
Albumin: 4.1 g/dL (ref 3.5–5.0)
Alkaline Phosphatase: 46 U/L (ref 38–126)
BILIRUBIN TOTAL: 0.7 mg/dL (ref 0.3–1.2)
BUN: 11 mg/dL (ref 6–20)
CO2: 25 mmol/L (ref 22–32)
Calcium: 9 mg/dL (ref 8.9–10.3)
Chloride: 105 mmol/L (ref 101–111)
Creatinine, Ser: 0.72 mg/dL (ref 0.44–1.00)
Glucose, Bld: 99 mg/dL (ref 65–99)
Potassium: 4 mmol/L (ref 3.5–5.1)
SODIUM: 139 mmol/L (ref 135–145)
TOTAL PROTEIN: 6.9 g/dL (ref 6.5–8.1)

## 2015-01-05 LAB — CBC
HEMATOCRIT: 38.4 % (ref 36.0–46.0)
HEMOGLOBIN: 12.8 g/dL (ref 12.0–15.0)
MCH: 32 pg (ref 26.0–34.0)
MCHC: 33.3 g/dL (ref 30.0–36.0)
MCV: 96 fL (ref 78.0–100.0)
Platelets: 269 10*3/uL (ref 150–400)
RBC: 4 MIL/uL (ref 3.87–5.11)
RDW: 13.1 % (ref 11.5–15.5)
WBC: 8.1 10*3/uL (ref 4.0–10.5)

## 2015-01-05 LAB — SALICYLATE LEVEL: Salicylate Lvl: <4 mg/dL (ref 2.8–30.0)

## 2015-01-05 LAB — ACETAMINOPHEN LEVEL

## 2015-01-05 NOTE — Discharge Instructions (Signed)
It was our pleasure to provide your ER care today - we hope that you feel better.  Your lab work looks good.  Please remember to never take any medication in over the recommended or prescribed dose, even over the counter medications such as aspirin or tylenol.  Return to ER if worse, new symptoms, medical emergency, other concern.

## 2015-01-05 NOTE — ED Provider Notes (Signed)
CSN: [id number]     Arrival date & time 01/05/15  1026 History  By signing my name below, I, Gina Snow, attest that this documentation has been prepared under the direction and in the presence of [name], MD. Electronically Signed: Erling Snow, ED Scribe. 01/05/2015. 11:52 AM.    Chief Complaint  Patient presents with  . Ingestion   The history is provided by the patient. No language interpreter was used.    HPI Comments: Gina Snow is a 24 y.o. female who presents to the Emergency Department complaining of an ingestion of 500mg  Tylenol this morning. She states she was having a migraine HA this morning and took 4-5 500mg  Tylenol at one time. She notes she took 2 500mg  tablets last night. Pt reports after she began having tightness in her chest. Pt denies any SI or feelings of wanting to hurt herself and that she was just in pain and wanted her migraine HA to go away. Pt denies any HA at this time, SOB, nausea, vomiting, abdominal pain or other complaints.  Pt denies any thoughts of self harm, depression or increased stress. Pt states earlier headache was similar to prior, and is now resolved. No abd pain or nv. Denies any other med use.     Past Medical History  Diagnosis Date  . Asthma   . Obesity   . PCO (polycystic ovaries)    History reviewed. No pertinent past surgical history. Family History  Problem Relation Age of Onset  . Clotting disorder Mother 54    had "brain" clot?  Marland Kitchen Hypertension Maternal Grandmother   . Diabetes Maternal Grandmother   . Thyroid disease Maternal Grandmother   . Hypertension Maternal Grandfather   . Diabetes Paternal Grandmother    Social History  Substance Use Topics  . Smoking status: Current Every Day Smoker -- 0.25 packs/day for 8 years    Types: Cigarettes  . Smokeless tobacco: Never Used  . Alcohol Use: Yes     Comment: every once in a while   OB History    Gravida Para Term Preterm AB TAB SAB Ectopic Multiple Living    0              Review of Systems  Constitutional: Negative for fever and chills.  Eyes: Negative for redness.  Respiratory: Negative for shortness of breath.   Cardiovascular: Negative for chest pain.  Gastrointestinal: Negative for abdominal pain.  Genitourinary: Negative for flank pain.  Musculoskeletal: Negative for back pain and neck pain.  Skin: Negative for rash.  Neurological: Negative for headaches.  Hematological: Does not bruise/bleed easily.  Psychiatric/Behavioral: Negative for confusion and dysphoric mood.  All other systems reviewed and are negative.     Allergies  Review of patient's allergies indicates no known allergies.  Home Medications   Prior to Admission medications   Medication Sig Start Date End Date Taking? Authorizing Provider  acetaminophen (TYLENOL) 325 MG tablet Take 650 mg by mouth every 6 (six) hours as needed for mild pain.   Yes Historical Provider, MD  ibuprofen (ADVIL,MOTRIN) 200 MG tablet Take 600 mg by mouth every 6 (six) hours as needed.   Yes Historical Provider, MD  levonorgestrel (MIRENA) 20 MCG/24HR IUD 1 each by Intrauterine route once.   Yes Historical Provider, MD  albuterol (PROVENTIL HFA;VENTOLIN HFA) 108 (90 BASE) MCG/ACT inhaler Inhale 2 puffs into the lungs every 4 (four) hours as needed for wheezing or shortness of breath. Patient not taking: Reported on 01/05/2015 07/12/13  Garvin Fila, MD  metFORMIN (GLUCOPHAGE) 500 MG tablet Take 1 tablet (500 mg total) by mouth daily with breakfast. Patient not taking: Reported on 01/05/2015 12/21/12   Sandi Mealy, MD  nicotine (NICODERM CQ - DOSED IN MG/24 HOURS) 14 mg/24hr patch Apply 1 patch to skin daily for 6 weeks. Patient not taking: Reported on 01/05/2015 07/16/13   Garvin Fila, MD  predniSONE (DELTASONE) 20 MG tablet Take 3 tablets (60 mg total) by mouth daily with breakfast. 07/16/13   Garvin Fila, MD  Vitamin D, Ergocalciferol, (DRISDOL) 50000 UNITS CAPS capsule  Take 1 capsule (50,000 Units total) by mouth every 7 (seven) days. Patient not taking: Reported on 01/05/2015 07/15/13   Garvin Fila, MD   Triage Vitals: BP 126/69 mmHg  Pulse 82  Temp(Src) 98.7 F (37.1 C) (Oral)  Resp 18  Ht 5\' 3"  (1.6 m)  Wt 219 lb (99.338 kg)  BMI 38.80 kg/m2  SpO2 100%  Physical Exam  Constitutional: She is oriented to person, place, and time. She appears well-developed and well-nourished. No distress.  HENT:  Head: Normocephalic and atraumatic.  Eyes: Conjunctivae and EOM are normal. No scleral icterus.  Neck: Neck supple. No tracheal deviation present.  Cardiovascular: Normal rate, regular rhythm, normal heart sounds and intact distal pulses.   Pulmonary/Chest: Effort normal and breath sounds normal. No respiratory distress.  Abdominal: Soft. Bowel sounds are normal. She exhibits no distension and no mass. There is no tenderness. There is no rebound and no guarding.  Musculoskeletal: Normal range of motion. She exhibits no edema.  Neurological: She is alert and oriented to person, place, and time. No cranial nerve deficit.  Skin: Skin is warm and dry. No rash noted. She is not diaphoretic.  Psychiatric: She has a normal mood and affect. Her behavior is normal.  Nursing note and vitals reviewed.   ED Course  Procedures (including critical care time)  DIAGNOSTIC STUDIES: Oxygen Saturation is 100% on RA, normal by my interpretation.    COORDINATION OF CARE: 11:57 AM- Will order diagnotic lab workup.  Pt advised of plan for treatment and pt agrees.   Results for orders placed or performed during the hospital encounter of 01/05/15  CBC  Result Value Ref Range   WBC 8.1 4.0 - 10.5 K/uL   RBC 4.00 3.87 - 5.11 MIL/uL   Hemoglobin 12.8 12.0 - 15.0 g/dL   HCT 38.4 36.0 - 46.0 %   MCV 96.0 78.0 - 100.0 fL   MCH 32.0 26.0 - 34.0 pg   MCHC 33.3 30.0 - 36.0 g/dL   RDW 13.1 11.5 - 15.5 %   Platelets 269 150 - 400 K/uL  Comprehensive metabolic panel   Result Value Ref Range   Sodium 139 135 - 145 mmol/L   Potassium 4.0 3.5 - 5.1 mmol/L   Chloride 105 101 - 111 mmol/L   CO2 25 22 - 32 mmol/L   Glucose, Bld 99 65 - 99 mg/dL   BUN 11 6 - 20 mg/dL   Creatinine, Ser 0.72 0.44 - 1.00 mg/dL   Calcium 9.0 8.9 - 10.3 mg/dL   Total Protein 6.9 6.5 - 8.1 g/dL   Albumin 4.1 3.5 - 5.0 g/dL   AST 17 15 - 41 U/L   ALT 18 14 - 54 U/L   Alkaline Phosphatase 46 38 - 126 U/L   Total Bilirubin 0.7 0.3 - 1.2 mg/dL   GFR calc non Af Amer >60 >60 mL/min   GFR calc  Af Amer >60 >60 mL/min   Anion gap 9 5 - 15  Protime-INR  Result Value Ref Range   Prothrombin Time 14.1 11.6 - 15.2 seconds   INR 1.07 0.00 - 1.49  Acetaminophen level  Result Value Ref Range   Acetaminophen (Tylenol), Serum <10 (L) 10 - 30 ug/mL  Salicylate level  Result Value Ref Range   Salicylate Lvl 123456 2.8 - 30.0 mg/dL        I have personally reviewed and evaluated these lab results as part of my medical decision-making.    MDM   I personally performed the services described in this documentation, which was scribed in my presence. The recorded information has been reviewed and considered. [name], MD  Reviewed nursing notes and prior charts for additional history.   Labs sent.  Recheck pt, no distress, and currently asymptomatic.  Pt appears stable for d/c.      [name], MD 01/05/15 1246

## 2015-01-05 NOTE — ED Notes (Addendum)
Patient states "I had a headache last night and took two tylenol. I woke up this morning with a headache and just turned the bottle of tylenol up and took some pills." Patient states she took 4-5 tylenol, 500 mg each. Denies headache at this time.

## 2015-01-05 NOTE — ED Notes (Signed)
Pt state she had a headache this morning. States she turned a bottle of tylenol up and took an unknown amount  of pills. Denies trying to overdose or hurt herself

## 2015-01-20 ENCOUNTER — Emergency Department (HOSPITAL_COMMUNITY)
Admission: EM | Admit: 2015-01-20 | Discharge: 2015-01-20 | Disposition: A | Discharge status: Home / Self Care | Payer: Medicaid Other | Attending: Emergency Medicine | Admitting: Emergency Medicine

## 2015-01-20 ENCOUNTER — Encounter (HOSPITAL_COMMUNITY): Payer: Self-pay | Admitting: *Deleted

## 2015-01-20 ENCOUNTER — Emergency Department (HOSPITAL_COMMUNITY): Payer: Medicaid Other

## 2015-01-20 DIAGNOSIS — S0081XA Abrasion of other part of head, initial encounter: Secondary | ICD-10-CM | POA: Insufficient documentation

## 2015-01-20 DIAGNOSIS — Z79899 Other long term (current) drug therapy: Secondary | ICD-10-CM | POA: Insufficient documentation

## 2015-01-20 DIAGNOSIS — Y998 Other external cause status: Secondary | ICD-10-CM | POA: Insufficient documentation

## 2015-01-20 DIAGNOSIS — M25512 Pain in left shoulder: Secondary | ICD-10-CM

## 2015-01-20 DIAGNOSIS — J45909 Unspecified asthma, uncomplicated: Secondary | ICD-10-CM | POA: Insufficient documentation

## 2015-01-20 DIAGNOSIS — S3992XA Unspecified injury of lower back, initial encounter: Secondary | ICD-10-CM | POA: Insufficient documentation

## 2015-01-20 DIAGNOSIS — S4991XA Unspecified injury of right shoulder and upper arm, initial encounter: Secondary | ICD-10-CM | POA: Insufficient documentation

## 2015-01-20 DIAGNOSIS — F17210 Nicotine dependence, cigarettes, uncomplicated: Secondary | ICD-10-CM | POA: Insufficient documentation

## 2015-01-20 DIAGNOSIS — Y9289 Other specified places as the place of occurrence of the external cause: Secondary | ICD-10-CM | POA: Insufficient documentation

## 2015-01-20 DIAGNOSIS — S20211A Contusion of right front wall of thorax, initial encounter: Secondary | ICD-10-CM

## 2015-01-20 DIAGNOSIS — S4992XA Unspecified injury of left shoulder and upper arm, initial encounter: Secondary | ICD-10-CM | POA: Insufficient documentation

## 2015-01-20 DIAGNOSIS — Y9389 Activity, other specified: Secondary | ICD-10-CM | POA: Insufficient documentation

## 2015-01-20 DIAGNOSIS — E669 Obesity, unspecified: Secondary | ICD-10-CM | POA: Insufficient documentation

## 2015-01-20 DIAGNOSIS — M25511 Pain in right shoulder: Secondary | ICD-10-CM

## 2015-01-20 MED ORDER — CYCLOBENZAPRINE HCL 10 MG PO TABS: 10.0000 mg | ORAL_TABLET | Freq: Two times a day (BID) | ORAL | Status: DC | PRN

## 2015-01-20 MED ORDER — DICLOFENAC SODIUM 50 MG PO TBEC: 50.0000 mg | DELAYED_RELEASE_TABLET | Freq: Two times a day (BID) | ORAL | Status: DC

## 2015-01-20 NOTE — Discharge Instructions (Signed)
Do not take the muscle relaxant if driving as it will make you sleepy.  °
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
## 2015-06-16 ENCOUNTER — Emergency Department (HOSPITAL_COMMUNITY)
Admission: EM | Admit: 2015-06-16 | Discharge: 2015-06-16 | Disposition: A | Discharge status: Home / Self Care | Payer: Managed Care, Other (non HMO) | Attending: Emergency Medicine | Admitting: Emergency Medicine

## 2015-06-16 ENCOUNTER — Encounter (HOSPITAL_COMMUNITY): Payer: Self-pay

## 2015-06-16 DIAGNOSIS — J45909 Unspecified asthma, uncomplicated: Secondary | ICD-10-CM | POA: Insufficient documentation

## 2015-06-16 DIAGNOSIS — M7989 Other specified soft tissue disorders: Secondary | ICD-10-CM | POA: Diagnosis not present

## 2015-06-16 DIAGNOSIS — R112 Nausea with vomiting, unspecified: Secondary | ICD-10-CM | POA: Insufficient documentation

## 2015-06-16 DIAGNOSIS — R109 Unspecified abdominal pain: Secondary | ICD-10-CM | POA: Diagnosis not present

## 2015-06-16 DIAGNOSIS — E669 Obesity, unspecified: Secondary | ICD-10-CM | POA: Diagnosis not present

## 2015-06-16 DIAGNOSIS — F17210 Nicotine dependence, cigarettes, uncomplicated: Secondary | ICD-10-CM | POA: Diagnosis not present

## 2015-06-16 LAB — URINALYSIS, ROUTINE W REFLEX MICROSCOPIC
BILIRUBIN URINE: NEGATIVE
Glucose, UA: NEGATIVE mg/dL
Hgb urine dipstick: NEGATIVE
KETONES UR: NEGATIVE mg/dL
Nitrite: NEGATIVE
PH: 6.5 (ref 5.0–8.0)
Protein, ur: NEGATIVE mg/dL
Specific Gravity, Urine: 1.02 (ref 1.005–1.030)

## 2015-06-16 LAB — URINE MICROSCOPIC-ADD ON

## 2015-06-16 LAB — PREGNANCY, URINE: PREG TEST UR: NEGATIVE

## 2015-06-16 NOTE — ED Notes (Signed)
Pt report feet swelling for the past 4 or 5 days.  Pt always wants pregnancy test.

## 2015-06-16 NOTE — Discharge Instructions (Signed)
Pregnancy test negative. Recommend ankle braces and firm supportive shoes with inserts. Decrease salt in your diet. Elevate feet with ice packs.

## 2015-06-16 NOTE — ED Provider Notes (Signed)
CSN: [id number]     Arrival date & time 06/16/15  1056 History  By signing my name below, I, Stephania Fragmin, attest that this documentation has been prepared under the direction and in the presence of Nat Christen, MD. Electronically Signed: Stephania Fragmin, ED Scribe. 06/16/2015. 11:29 AM.  Chief Complaint  Patient presents with  . Foot Swelling   The history is provided by the patient. No language interpreter was used.  HPI Comments: Gina Snow is a 25 y.o. female with a history of obesity, who presents to the Emergency Department complaining of gradual-onset, constant, gradually worsening bilateral feet swelling that began 4-5 days ago. She reports increased pain with ambulation, particularly in the dorsa of her feet. Patient states she is on her feet for long shifts (16 hours) for her job. No treatments were noted.  Patient also reports she has had pregnancy symptoms, including intermittent cramping, nausea, and vomiting clear fluid; she requests a pregnancy test. She reports she has not had menses since a year ago, when she had the Nexplanon implanted. She states the Nexplanon is still in place.  Past Medical History  Diagnosis Date  . Asthma   . Obesity   . PCO (polycystic ovaries)    History reviewed. No pertinent past surgical history. Family History  Problem Relation Age of Onset  . Clotting disorder Mother 35    had "brain" clot?  Marland Kitchen Hypertension Maternal Grandmother   . Diabetes Maternal Grandmother   . Thyroid disease Maternal Grandmother   . Hypertension Maternal Grandfather   . Diabetes Paternal Grandmother    Social History  Substance Use Topics  . Smoking status: Current Every Day Smoker -- 0.25 packs/day for 8 years    Types: Cigarettes  . Smokeless tobacco: Never Used  . Alcohol Use: Yes     Comment: every once in a while   OB History    Gravida Para Term Preterm AB TAB SAB Ectopic Multiple Living   0              Review of Systems  Cardiovascular: Positive for  leg swelling.  Gastrointestinal: Positive for nausea, vomiting and abdominal pain (cramping).  All other systems reviewed and are negative.     Allergies  Review of patient's allergies indicates no known allergies.  Home Medications   Prior to Admission medications   Not on File   BP 130/70 mmHg  Pulse 77  Temp(Src) 98.1 F (36.7 C) (Temporal)  Resp 24  Ht 5\' 3"  (1.6 m)  Wt 223 lb (101.152 kg)  BMI 39.51 kg/m2  SpO2 100% Physical Exam  Constitutional: She is oriented to person, place, and time. She appears well-developed and well-nourished.  HENT:  Head: Normocephalic and atraumatic.  Eyes: Conjunctivae and EOM are normal. Pupils are equal, round, and reactive to light.  Neck: Normal range of motion. Neck supple.  Cardiovascular: Normal rate and regular rhythm.   Pulmonary/Chest: Effort normal and breath sounds normal.  Abdominal: Soft. Bowel sounds are normal.  Musculoskeletal: Normal range of motion. She exhibits edema.  Minimal bilateral dorsal foot swelling, with some TTP. Minimal TTP on plantar aspect of feet bilaterally.  Neurological: She is alert and oriented to person, place, and time.  Skin: Skin is warm and dry.  Psychiatric: She has a normal mood and affect. Her behavior is normal.  Nursing note and vitals reviewed.   ED Course  Procedures (including critical care time)  DIAGNOSTIC STUDIES: Oxygen Saturation is 100% on RA, normal by  my interpretation.    COORDINATION OF CARE: 11:25 AM - Do not believe x-rays are warranted at this time. Discussed treatment plan with pt at bedside which includes conservative treatment, ibuprofen for pain. Will perform pregnancy test, per pt request. Pt verbalized understanding and agreed to plan.   Labs Review Labs Reviewed  URINALYSIS, ROUTINE W REFLEX MICROSCOPIC (NOT AT [facility]) - Abnormal; Notable for the following:    Leukocytes, UA TRACE (*)    All other components within normal limits  URINE MICROSCOPIC-ADD ON -  Abnormal; Notable for the following:    Squamous Epithelial / LPF 0-5 (*)    Bacteria, UA FEW (*)    All other components within normal limits  PREGNANCY, URINE    Imaging Review No results found. I have personally reviewed and evaluated these images and lab results as part of my medical decision-making.   EKG Interpretation None      MDM   Final diagnoses:  Bilateral swelling of feet   Patient is in no acute distress. Pregnancy test negative. Recommend ankle braces, shoe inserts, elevation, ice, decrease salt in diet.   I personally performed the services described in this documentation, which was scribed in my presence. The recorded information has been reviewed and is accurate.      Nat Christen, MD 06/16/15 1341

## 2015-06-18 ENCOUNTER — Other Ambulatory Visit: Payer: Medicaid Other | Admitting: Advanced Practice Midwife

## 2016-07-22 ENCOUNTER — Emergency Department (HOSPITAL_COMMUNITY)
Admission: EM | Admit: 2016-07-22 | Discharge: 2016-07-22 | Disposition: A | Discharge status: Home / Self Care | Payer: Self-pay | Attending: Emergency Medicine | Admitting: Emergency Medicine

## 2016-07-22 ENCOUNTER — Emergency Department (HOSPITAL_COMMUNITY): Payer: Self-pay

## 2016-07-22 ENCOUNTER — Encounter (HOSPITAL_COMMUNITY): Payer: Self-pay | Admitting: Emergency Medicine

## 2016-07-22 DIAGNOSIS — W228XXA Striking against or struck by other objects, initial encounter: Secondary | ICD-10-CM | POA: Insufficient documentation

## 2016-07-22 DIAGNOSIS — S92502A Displaced unspecified fracture of left lesser toe(s), initial encounter for closed fracture: Secondary | ICD-10-CM

## 2016-07-22 DIAGNOSIS — Y929 Unspecified place or not applicable: Secondary | ICD-10-CM | POA: Insufficient documentation

## 2016-07-22 DIAGNOSIS — Y939 Activity, unspecified: Secondary | ICD-10-CM | POA: Insufficient documentation

## 2016-07-22 DIAGNOSIS — J45909 Unspecified asthma, uncomplicated: Secondary | ICD-10-CM | POA: Insufficient documentation

## 2016-07-22 DIAGNOSIS — M205X2 Other deformities of toe(s) (acquired), left foot: Secondary | ICD-10-CM | POA: Insufficient documentation

## 2016-07-22 DIAGNOSIS — F17210 Nicotine dependence, cigarettes, uncomplicated: Secondary | ICD-10-CM | POA: Insufficient documentation

## 2016-07-22 DIAGNOSIS — Q899 Congenital malformation, unspecified: Secondary | ICD-10-CM

## 2016-07-22 DIAGNOSIS — Y999 Unspecified external cause status: Secondary | ICD-10-CM | POA: Insufficient documentation

## 2016-07-22 DIAGNOSIS — S92512A Displaced fracture of proximal phalanx of left lesser toe(s), initial encounter for closed fracture: Secondary | ICD-10-CM | POA: Insufficient documentation

## 2016-07-22 MED ORDER — HYDROCODONE-ACETAMINOPHEN 5-325 MG PO TABS: ORAL_TABLET | ORAL | 0 refills | Status: DC

## 2016-07-22 MED ORDER — IBUPROFEN 600 MG PO TABS: 600.0000 mg | ORAL_TABLET | Freq: Four times a day (QID) | ORAL | 0 refills | Status: DC | PRN

## 2016-07-22 NOTE — ED Triage Notes (Signed)
Pt report L little toe pain for the last 2 days - hit something in the dark  [facility] department is PCP

## 2016-07-22 NOTE — Discharge Instructions (Signed)
Keep the toe buddy taped and wear the post op shoe as needed.  Call Dr. Ruthe Mannan office to arrange a follow-up

## 2016-07-22 NOTE — ED Triage Notes (Signed)
Stumped left pinky tow x 2 days ago. Deformity noted. nad

## 2016-07-24 NOTE — ED Provider Notes (Signed)
[place] DEPT Provider Note   CSN: [id number] Arrival date & time: 07/22/16  1301     History   Chief Complaint Chief Complaint  Patient presents with  . Toe Pain    HPI LARRI Snow is a 26 y.o. female.  HPI   Gina Snow is a 26 y.o. female who presents to the Emergency Department complaining of pain and swelling of the distal left foot and pain of the fifth toe.  States that she stumped her toe two days prior to arrival.  Describes throbbing pain to her foot with is worse with wt bearing and palpation.  She denies numbness, open wounds of the foot.    Past Medical History:  Diagnosis Date  . Asthma   . Obesity   . PCO (polycystic ovaries)     Patient Active Problem List   Diagnosis Date Noted  . Dog bite of arm 03/25/2013  . Glucose intolerance (impaired glucose tolerance) 12/21/2012  . PCOS (polycystic ovarian syndrome) 12/21/2012  . Prediabetes 10/26/2012  . Severe obesity (BMI >= 40) ([place]) 09/24/2012  . Irregular menses 09/24/2012  . Hyperglycemia 09/24/2012  . Unspecified vitamin D deficiency 09/24/2012  . BV (bacterial vaginosis) 09/24/2012  . Vaginal discharge 09/24/2012  . Menorrhagia 09/17/2012  . Vision blurred 09/17/2012  . Tobacco use 09/17/2012    History reviewed. No pertinent surgical history.  OB History    Gravida Para Term Preterm AB Living   0             SAB TAB Ectopic Multiple Live Births                   Home Medications    Prior to Admission medications   Medication Sig Start Date End Date Taking? Authorizing Provider  HYDROcodone-acetaminophen (NORCO/VICODIN) 5-325 MG tablet Take one tab po q 4-6 hrs prn pain 07/22/16   Jaquell Seddon, PA-C  ibuprofen (ADVIL,MOTRIN) 600 MG tablet Take 1 tablet (600 mg total) by mouth every 6 (six) hours as needed. 07/22/16   Kem Parkinson, PA-C    Family History Family History  Problem Relation Age of Onset  . Clotting disorder Mother 73       had "brain" clot?  Marland Kitchen  Hypertension Maternal Grandmother   . Diabetes Maternal Grandmother   . Thyroid disease Maternal Grandmother   . Hypertension Maternal Grandfather   . Diabetes Paternal Grandmother     Social History Social History  Substance Use Topics  . Smoking status: Current Every Day Smoker    Packs/day: 0.25    Years: 8.00    Types: Cigarettes  . Smokeless tobacco: Never Used  . Alcohol use Yes     Comment: every once in a while     Allergies   Patient has no known allergies.   Review of Systems Review of Systems  Constitutional: Negative for chills and fever.  Musculoskeletal: Positive for arthralgias (left fifth toe and distal foot pain) and joint swelling.  Skin: Negative for color change and wound.  All other systems reviewed and are negative.    Physical Exam Updated Vital Signs BP 130/70   Pulse 71   Temp 97.4 F (36.3 C) (Temporal)   Resp 18   LMP 07/01/2016 (Within Days)   SpO2 100%   Physical Exam  Constitutional: She is oriented to person, place, and time. She appears well-developed and well-nourished. No distress.  HENT:  Head: Normocephalic and atraumatic.  Cardiovascular: Normal rate, regular rhythm, normal heart sounds  and intact distal pulses.   Pulmonary/Chest: Effort normal and breath sounds normal.  Musculoskeletal: She exhibits edema and tenderness.       Feet:  ttp of the distal foot and left fifth toe.  No erythema, abrasion, bruising or open wounds.  No proximal tenderness.  Neurological: She is alert and oriented to person, place, and time. She exhibits normal muscle tone. Coordination normal.  Skin: Skin is warm and dry.  Nursing note and vitals reviewed.    ED Treatments / Results  Labs (all labs ordered are listed, but only abnormal results are displayed) Labs Reviewed - No data to display  EKG  EKG Interpretation None       Radiology No results found.  Procedures Procedures (including critical care time)  Medications  Ordered in ED Medications - No data to display   Initial Impression / Assessment and Plan / ED Course  I have reviewed the triage vital signs and the nursing notes.  Pertinent labs & imaging results that were available during my care of the patient were reviewed by me and considered in my medical decision making (see chart for details).     XR results discussed.  Manual reduction of the toe by me and then buddy taped.  Post op shoe given.  Pt agrees to RICE therapy.  Advised to keep toe taped and orthopedic referral.  Pt agrees to plan.  Pain improved and remains NV intact   Final Clinical Impressions(s) / ED Diagnoses   Final diagnoses:  Deformity  Closed fracture of phalanx of left fifth toe, initial encounter    New Prescriptions Discharge Medication List as of 07/22/2016  3:31 PM    START taking these medications   Details  HYDROcodone-acetaminophen (NORCO/VICODIN) 5-325 MG tablet Take one tab po q 4-6 hrs prn pain, Print    ibuprofen (ADVIL,MOTRIN) 600 MG tablet Take 1 tablet (600 mg total) by mouth every 6 (six) hours as needed., Starting Fri 07/22/2016, Print         Makenzey Nanni, [place], PA-C 07/24/16 2314    Noemi Chapel, MD 07/26/16 [phone]

## 2017-04-27 ENCOUNTER — Encounter: Payer: Managed Care, Other (non HMO) | Admitting: Adult Health

## 2017-05-09 ENCOUNTER — Encounter: Payer: Self-pay | Admitting: Advanced Practice Midwife

## 2017-05-24 ENCOUNTER — Encounter: Payer: Self-pay | Admitting: Advanced Practice Midwife

## 2017-06-01 ENCOUNTER — Encounter: Payer: Self-pay | Admitting: Adult Health

## 2017-07-05 ENCOUNTER — Emergency Department (HOSPITAL_COMMUNITY)
Admission: EM | Admit: 2017-07-05 | Discharge: 2017-07-05 | Disposition: A | Discharge status: Home / Self Care | Payer: Self-pay | Attending: Emergency Medicine | Admitting: Emergency Medicine

## 2017-07-05 ENCOUNTER — Other Ambulatory Visit: Payer: Self-pay

## 2017-07-05 ENCOUNTER — Encounter (HOSPITAL_COMMUNITY): Payer: Self-pay | Admitting: Emergency Medicine

## 2017-07-05 ENCOUNTER — Emergency Department (HOSPITAL_COMMUNITY): Payer: Self-pay

## 2017-07-05 DIAGNOSIS — W010XXA Fall on same level from slipping, tripping and stumbling without subsequent striking against object, initial encounter: Secondary | ICD-10-CM | POA: Insufficient documentation

## 2017-07-05 DIAGNOSIS — Y939 Activity, unspecified: Secondary | ICD-10-CM | POA: Insufficient documentation

## 2017-07-05 DIAGNOSIS — Y999 Unspecified external cause status: Secondary | ICD-10-CM | POA: Insufficient documentation

## 2017-07-05 DIAGNOSIS — F17210 Nicotine dependence, cigarettes, uncomplicated: Secondary | ICD-10-CM | POA: Insufficient documentation

## 2017-07-05 DIAGNOSIS — M25562 Pain in left knee: Secondary | ICD-10-CM | POA: Insufficient documentation

## 2017-07-05 DIAGNOSIS — J45909 Unspecified asthma, uncomplicated: Secondary | ICD-10-CM | POA: Insufficient documentation

## 2017-07-05 DIAGNOSIS — G8929 Other chronic pain: Secondary | ICD-10-CM | POA: Insufficient documentation

## 2017-07-05 DIAGNOSIS — R2242 Localized swelling, mass and lump, left lower limb: Secondary | ICD-10-CM | POA: Insufficient documentation

## 2017-07-05 DIAGNOSIS — Y9289 Other specified places as the place of occurrence of the external cause: Secondary | ICD-10-CM | POA: Insufficient documentation

## 2017-07-05 MED ORDER — NAPROXEN 375 MG PO TABS: 375.0000 mg | ORAL_TABLET | Freq: Two times a day (BID) | ORAL | 0 refills | Status: DC

## 2017-07-05 MED ORDER — LIDOCAINE 5 % EX PTCH: 1.0000 | MEDICATED_PATCH | CUTANEOUS | 0 refills | Status: DC

## 2017-07-05 NOTE — ED Notes (Signed)
Pt back from x-ray.

## 2017-07-05 NOTE — ED Notes (Signed)
Pt complaining of pain all around left knee. [place]. Able to walk with a steady gait, but once straightening out knee pain starts.

## 2017-07-05 NOTE — Discharge Instructions (Addendum)
Your x-ray shows a small amount of fluid under your knee bone.  Wear the compression knee sleeve for comfort. Please take the Naproxen as prescribed for pain. Do not take any additional NSAIDs including Motrin, Aleve, Ibuprofen, Advil. Please rest, ice, compress and elevated the affected body part to help with swelling and pain.  Please follow-up with orthopedic doctor as provided.  Return to ED if you develop any swelling in your left leg, redness, chest pain or shortness of breath.

## 2017-07-05 NOTE — ED Provider Notes (Signed)
[facility] EMERGENCY DEPARTMENT Provider Note   CSN: [id number] Arrival date & time: 07/05/17  1432     History   Chief Complaint Chief Complaint  Patient presents with  . Knee Pain    HPI Gina Snow is a 27 y.o. female.  HPI 27 year old Afro-American female with no pertinent past medical history presents to the ED for evaluation of acute on chronic left knee pain.  States that she is been dealing with left knee pain for several years.  States that it has been gradually getting worse.  [place]  States that this morning when she woke up she had increased pain to her left knee.  She states that she has some difficulties bending.  The pain is in the top of the knee and the back of the knee.  Patient states she has not tried any home medications for her pain.  She has never seen orthopedic doctor for her knee pain.  Patient denies any associated paresthesias or weakness.  Ambulation and palpation along with range of motion makes the pain worse.  Holding still and straight makes the pain better.  Denies any associated lower extremity edema or calf pain.  Denies any chest pain or shortness of breath. Past Medical History:  Diagnosis Date  . Asthma   . Obesity   . PCO (polycystic ovaries)     Patient Active Problem List   Diagnosis Date Noted  . Dog bite of arm 03/25/2013  . Glucose intolerance (impaired glucose tolerance) 12/21/2012  . PCOS (polycystic ovarian syndrome) 12/21/2012  . Prediabetes 10/26/2012  . Severe obesity (BMI >= 40) ([place]) 09/24/2012  . Irregular menses 09/24/2012  . Hyperglycemia 09/24/2012  . Unspecified vitamin D deficiency 09/24/2012  . BV (bacterial vaginosis) 09/24/2012  . Vaginal discharge 09/24/2012  . Menorrhagia 09/17/2012  . Vision blurred 09/17/2012  . Tobacco use 09/17/2012    History reviewed. No pertinent surgical history.   OB History    Gravida  0   Para      Term      Preterm      AB      Living        SAB      TAB      Ectopic      Multiple      Live Births               Home Medications    Prior to Admission medications   Medication Sig Start Date End Date Taking? Authorizing Provider  HYDROcodone-acetaminophen (NORCO/VICODIN) 5-325 MG tablet Take one tab po q 4-6 hrs prn pain 07/22/16   Triplett, Tammy, PA-C  ibuprofen (ADVIL,MOTRIN) 600 MG tablet Take 1 tablet (600 mg total) by mouth every 6 (six) hours as needed. 07/22/16   Triplett, Tammy, PA-C  lidocaine (LIDODERM) 5 % Place 1 patch onto the skin daily. Remove & Discard patch within 12 hours or as directed by MD 07/05/17   Doristine Devoid, PA-C  naproxen (NAPROSYN) 375 MG tablet Take 1 tablet (375 mg total) by mouth 2 (two) times daily. 07/05/17   Doristine Devoid, PA-C    Family History Family History  Problem Relation Age of Onset  . Clotting disorder Mother 34       had "brain" clot?  Marland Kitchen Hypertension Maternal Grandmother   . Diabetes Maternal Grandmother   . Thyroid disease Maternal Grandmother   . Hypertension Maternal Grandfather   .  Diabetes Paternal Grandmother     Social History Social History   Tobacco Use  . Smoking status: Current Every Day Smoker    Packs/day: 0.25    Years: 8.00    Pack years: 2.00    Types: Cigarettes  . Smokeless tobacco: Never Used  Substance Use Topics  . Alcohol use: Yes    Comment: every once in a while  . Drug use: No     Allergies   Patient has no known allergies.   Review of Systems Review of Systems  All other systems reviewed and are negative.    Physical Exam Updated Vital Signs BP 132/71   Pulse 81   Temp 98.1 F (36.7 C) (Oral)   Resp 16   Ht 5\' 4"  (1.626 m)   Wt 104.3 kg (230 lb)   LMP 06/13/2017   SpO2 100%   BMI 39.48 kg/m   Physical Exam  Constitutional: She appears well-developed and well-nourished. No distress.  HENT:  Head: Normocephalic and atraumatic.  Eyes: Right eye exhibits no  discharge. Left eye exhibits no discharge. No scleral icterus.  Neck: Normal range of motion.  Pulmonary/Chest: No respiratory distress.  Musculoskeletal: Normal range of motion.       Left knee: She exhibits swelling. She exhibits normal range of motion, no effusion, no ecchymosis, no deformity, no laceration, no erythema, normal alignment, no LCL laxity, normal patellar mobility, no bony tenderness, normal meniscus and no MCL laxity. Tenderness found. Patellar tendon tenderness noted.  No joint effusion noted.  Negative anterior door.  No joint laxity with valgus and varus stress.  No lower extremity edema or calf tenderness.  DP pulses are 2+ bilaterally.  Brisk cap refill.  Strength is normal.  Skin compartments are soft.  No erythema or warmth over the joint.  Neurological: She is alert.  Skin: No pallor.  Psychiatric: Her behavior is normal. Judgment and thought content normal.  Nursing note and vitals reviewed.    ED Treatments / Results  Labs (all labs ordered are listed, but only abnormal results are displayed) Labs Reviewed - No data to display  EKG None  Radiology Dg Knee Complete 4 Views Left  Result Date: 07/05/2017 CLINICAL DATA:  Fall.  Left knee pain. EXAM: LEFT KNEE - COMPLETE 4+ VIEW COMPARISON:  None. FINDINGS: Small suprapatellar left knee joint effusion. No fracture. No dislocation. No suspicious focal osseous lesion. No significant degenerative arthropathy. Small corticated enthesophyte at the tibial tubercle. No radiopaque foreign body. IMPRESSION: Small suprapatellar left knee joint effusion, with no fracture or malalignment. Electronically Signed   By: Ilona Sorrel M.D.   On: 07/05/2017 15:24    Procedures Procedures (including critical care time)  Medications Ordered in ED Medications - No data to display   Initial Impression / Assessment and Plan / ED Course  I have reviewed the triage vital signs and the nursing notes.  Pertinent labs & imaging results  that were available during my care of the patient were reviewed by me and considered in my medical decision making (see chart for details).     Patient X-Ray negative for obvious fracture or dislocation.  Patient has small effusion noted on the x-ray however do not feel the patient needs any intervention at this time.  Will place patient in knee sleeve.  Patient presentation does not seem consistent with DVT.  She has no signs of a septic arthritis.  Pain managed in ED. Pt advised to follow up with orthopedics if symptoms persist  for possibility of missed fracture diagnosis. Patient given knee sleeve while in ED, conservative therapy recommended and discussed. Patient will be dc home & is agreeable with above plan.   Final Clinical Impressions(s) / ED Diagnoses   Final diagnoses:  Chronic pain of left knee    ED Discharge Orders        Ordered    naproxen (NAPROSYN) 375 MG tablet  2 times daily     07/05/17 1559    lidocaine (LIDODERM) 5 %  Every 24 hours     07/05/17 1559       Doristine Devoid, PA-C 07/05/17 1623    Noemi Chapel, MD 07/08/17 [phone]

## 2017-07-05 NOTE — ED Triage Notes (Signed)
Pt states slipped on rock at [place] yesterday hurting left knee. Pt states has history of pain to that knee.ambulatory with no difficulty.

## 2017-07-05 NOTE — ED Notes (Signed)
Pt to xray

## 2017-08-21 ENCOUNTER — Encounter (HOSPITAL_COMMUNITY): Payer: Self-pay | Admitting: Emergency Medicine

## 2017-08-21 ENCOUNTER — Emergency Department (HOSPITAL_COMMUNITY)
Admission: EM | Admit: 2017-08-21 | Discharge: 2017-08-21 | Disposition: A | Discharge status: Home / Self Care | Payer: Medicaid Other | Attending: Emergency Medicine | Admitting: Emergency Medicine

## 2017-08-21 ENCOUNTER — Other Ambulatory Visit: Payer: Self-pay

## 2017-08-21 DIAGNOSIS — J45909 Unspecified asthma, uncomplicated: Secondary | ICD-10-CM | POA: Insufficient documentation

## 2017-08-21 DIAGNOSIS — J020 Streptococcal pharyngitis: Secondary | ICD-10-CM

## 2017-08-21 DIAGNOSIS — F17210 Nicotine dependence, cigarettes, uncomplicated: Secondary | ICD-10-CM | POA: Insufficient documentation

## 2017-08-21 DIAGNOSIS — Z79899 Other long term (current) drug therapy: Secondary | ICD-10-CM | POA: Insufficient documentation

## 2017-08-21 LAB — GROUP A STREP BY PCR: Group A Strep by PCR: DETECTED — AB

## 2017-08-21 MED ORDER — AMOXICILLIN 500 MG PO CAPS: 500.0000 mg | ORAL_CAPSULE | Freq: Two times a day (BID) | ORAL | 0 refills | Status: AC

## 2017-08-21 NOTE — ED Triage Notes (Signed)
Sore throat x 2 days ago

## 2017-08-21 NOTE — Discharge Instructions (Signed)
Your strep test was positive today.This type of infection is treated with antibiotics and over the counter medications to help with symptoms.    Take amoxicillin as prescribed.  Stay well-hydrated.  Soft, liquid diet can help prevent further inflammation in the throat.  For pain take 1000 mg acetaminophen (tylenol) every 6 hours for pain.  If you need more pain control, can take 600 mg ibuprofen (advil, motrin) every 6 hours with it.   Any throat infection can lead to posterior deep neck abscess. Monitor for signs of worsening infection including persistent fever, changes to voice, neck stiffness, difficulty opening your jaw, drooling, swelling of neck/throat.

## 2017-08-21 NOTE — ED Provider Notes (Signed)
[facility] EMERGENCY DEPARTMENT Provider Note   CSN: [id number] Arrival date & time: 08/21/17  1307     History   Chief Complaint Chief Complaint  Patient presents with  . Sore Throat    HPI Gina Snow is a 27 y.o. female here for evaluation of sore throat.  Onset 2 days ago.  Pain is intermittent, worse with swallowing.  No associated fevers, chills, changes in voice, drooling, tongue/lip/facial swelling, difficulty breathing, neck stiffness or pain.  She has no pain with talking.  No sick contacts.  No interventions PTA.  HPI  Past Medical History:  Diagnosis Date  . Asthma   . Obesity   . PCO (polycystic ovaries)     Patient Active Problem List   Diagnosis Date Noted  . Dog bite of arm 03/25/2013  . Glucose intolerance (impaired glucose tolerance) 12/21/2012  . PCOS (polycystic ovarian syndrome) 12/21/2012  . Prediabetes 10/26/2012  . Severe obesity (BMI >= 40) ([place]) 09/24/2012  . Irregular menses 09/24/2012  . Hyperglycemia 09/24/2012  . Unspecified vitamin D deficiency 09/24/2012  . BV (bacterial vaginosis) 09/24/2012  . Vaginal discharge 09/24/2012  . Menorrhagia 09/17/2012  . Vision blurred 09/17/2012  . Tobacco use 09/17/2012    History reviewed. No pertinent surgical history.   OB History    Gravida  0   Para      Term      Preterm      AB      Living        SAB      TAB      Ectopic      Multiple      Live Births               Home Medications    Prior to Admission medications   Medication Sig Start Date End Date Taking? Authorizing Provider  amoxicillin (AMOXIL) 500 MG capsule Take 1 capsule (500 mg total) by mouth 2 (two) times daily for 10 days. 08/21/17 08/31/17  Kinnie Feil, PA-C  HYDROcodone-acetaminophen (NORCO/VICODIN) 5-325 MG tablet Take one tab po q 4-6 hrs prn pain 07/22/16   Triplett, Tammy, PA-C  ibuprofen (ADVIL,MOTRIN) 600 MG tablet Take 1 tablet (600 mg total) by mouth every 6 (six) hours as needed.  07/22/16   Triplett, Tammy, PA-C  lidocaine (LIDODERM) 5 % Place 1 patch onto the skin daily. Remove & Discard patch within 12 hours or as directed by MD 07/05/17   Doristine Devoid, PA-C  naproxen (NAPROSYN) 375 MG tablet Take 1 tablet (375 mg total) by mouth 2 (two) times daily. 07/05/17   Doristine Devoid, PA-C    Family History Family History  Problem Relation Age of Onset  . Clotting disorder Mother 68       had "brain" clot?  Marland Kitchen Hypertension Maternal Grandmother   . Diabetes Maternal Grandmother   . Thyroid disease Maternal Grandmother   . Hypertension Maternal Grandfather   . Diabetes Paternal Grandmother     Social History Social History   Tobacco Use  . Smoking status: Current Every Day Smoker    Packs/day: 0.25    Years: 8.00    Pack years: 2.00    Types: Cigarettes  . Smokeless tobacco: Never Used  Substance Use Topics  . Alcohol use: Yes    Comment: every once in a while  . Drug use: No     Allergies   Patient has no known allergies.   Review of Systems Review  of Systems  HENT: Positive for sore throat.   All other systems reviewed and are negative.    Physical Exam Updated Vital Signs BP 134/67 (BP Location: Right Arm)   Pulse 88   Temp 98.3 F (36.8 C) (Oral)   Resp 18   Ht 5\' 4"  (1.626 m)   Wt 98.9 kg (218 lb)   LMP 08/03/2017   SpO2 100%   BMI 37.42 kg/m   Physical Exam  Constitutional: She is oriented to person, place, and time. She appears well-developed and well-nourished. No distress.  NAD.  HENT:  Head: Normocephalic and atraumatic.  Right Ear: External ear normal.  Left Ear: External ear normal.  Nose: Nose normal.  Moderate, symmetric tonsillar hypertrophy with exudates bilaterally and petechiae to oropharynx.  Uvula midline without edema.  Widely patent oropharynx.  No trismus, sublingual edema or tenderness.  Normal tongue protrusion.  No stridor.  No hot potato voice.  No nasal mucosal edema.  Eyes: Conjunctivae and EOM  are normal. No scleral icterus.  Neck: Normal range of motion. Neck supple.  No anterior neck edema.  No cervical adenopathy.  Cardiovascular: Normal rate, regular rhythm and normal heart sounds.  No murmur heard. Pulmonary/Chest: Effort normal and breath sounds normal. She has no wheezes.  Musculoskeletal: Normal range of motion. She exhibits no deformity.  Neurological: She is alert and oriented to person, place, and time.  Skin: Skin is warm and dry. Capillary refill takes less than 2 seconds.  Psychiatric: She has a normal mood and affect. Her behavior is normal. Judgment and thought content normal.  Nursing note and vitals reviewed.    ED Treatments / Results  Labs (all labs ordered are listed, but only abnormal results are displayed) Labs Reviewed  GROUP A STREP BY PCR - Abnormal; Notable for the following components:      Result Value   Group A Strep by PCR DETECTED (*)    All other components within normal limits    EKG None  Radiology No results found.  Procedures Procedures (including critical care time)  Medications Ordered in ED Medications - No data to display   Initial Impression / Assessment and Plan / ED Course  I have reviewed the triage vital signs and the nursing notes.  Pertinent labs & imaging results that were available during my care of the patient were reviewed by me and considered in my medical decision making (see chart for details).     27 y.o. yo female here with sore throat. Positive strep test.  No signs of angioedema. No asymmetry to tonsillar hypertrophy, uvula deviation, hot potato voice, trismus, or drooling to raise suspicion for deep neck space infection such as RPA, ludwig's or PTA. Doubt epiglottitis in this well appearing fully vaccinated patient. Will discharge with amoxicillin and symptomatic management. I have discussed signs and symptoms that would warrant immediate return to ER and patient verbalized understanding. She is  tolerating secretions and PO in ER.   Old records, if available, reviewed by me. Imaging and labs viewed and interpreted by me and used in the medical decision making (formal interpretation from radiologist). Discharge home in stable condition, return precautions discussed. Patient, family agreeable with plan for discharge home.   Final Clinical Impressions(s) / ED Diagnoses   Final diagnoses:  Strep pharyngitis    ED Discharge Orders        Ordered    amoxicillin (AMOXIL) 500 MG capsule  2 times daily     08/21/17 1413  Kinnie Feil, PA-C 08/21/17 [place], Julie, MD 08/23/17 1615

## 2017-09-05 ENCOUNTER — Telehealth: Payer: Self-pay | Admitting: Obstetrics & Gynecology

## 2017-09-05 NOTE — Telephone Encounter (Signed)
[facility] stating that this patient is 4 wks and 3 days pregnant and needs a dating [place]. Please contact pt when scheduled.

## 2017-09-06 ENCOUNTER — Other Ambulatory Visit: Payer: Self-pay | Admitting: *Deleted

## 2017-09-06 DIAGNOSIS — O3680X0 Pregnancy with inconclusive fetal viability, not applicable or unspecified: Secondary | ICD-10-CM

## 2017-09-06 NOTE — Telephone Encounter (Signed)
Dating u/s scheduled for 8/12 @ 12:30

## 2017-09-25 ENCOUNTER — Ambulatory Visit (HOSPITAL_COMMUNITY)
Admission: RE | Admit: 2017-09-25 | Discharge: 2017-09-25 | Disposition: A | Discharge status: Home / Self Care | Payer: Medicaid Other | Source: Ambulatory Visit | Attending: Adult Health | Admitting: Adult Health

## 2017-09-25 DIAGNOSIS — O3680X0 Pregnancy with inconclusive fetal viability, not applicable or unspecified: Secondary | ICD-10-CM

## 2017-09-25 DIAGNOSIS — Z3A09 9 weeks gestation of pregnancy: Secondary | ICD-10-CM | POA: Insufficient documentation

## 2017-09-25 DIAGNOSIS — O208 Other hemorrhage in early pregnancy: Secondary | ICD-10-CM | POA: Diagnosis not present

## 2017-10-03 ENCOUNTER — Ambulatory Visit (INDEPENDENT_AMBULATORY_CARE_PROVIDER_SITE_OTHER): Payer: Medicaid Other | Admitting: Women's Health

## 2017-10-03 ENCOUNTER — Ambulatory Visit: Payer: Medicaid Other | Admitting: *Deleted

## 2017-10-03 ENCOUNTER — Encounter: Payer: Self-pay | Admitting: Women's Health

## 2017-10-03 VITALS — BP 122/62 | HR 89 | Wt 217.0 lb

## 2017-10-03 DIAGNOSIS — Z1389 Encounter for screening for other disorder: Secondary | ICD-10-CM

## 2017-10-03 DIAGNOSIS — O99331 Smoking (tobacco) complicating pregnancy, first trimester: Secondary | ICD-10-CM

## 2017-10-03 DIAGNOSIS — R7303 Prediabetes: Secondary | ICD-10-CM

## 2017-10-03 DIAGNOSIS — F17200 Nicotine dependence, unspecified, uncomplicated: Secondary | ICD-10-CM

## 2017-10-03 DIAGNOSIS — Z3A11 11 weeks gestation of pregnancy: Secondary | ICD-10-CM

## 2017-10-03 DIAGNOSIS — Z3401 Encounter for supervision of normal first pregnancy, first trimester: Secondary | ICD-10-CM

## 2017-10-03 DIAGNOSIS — Z331 Pregnant state, incidental: Secondary | ICD-10-CM

## 2017-10-03 DIAGNOSIS — Z3400 Encounter for supervision of normal first pregnancy, unspecified trimester: Secondary | ICD-10-CM | POA: Insufficient documentation

## 2017-10-03 LAB — POCT URINALYSIS DIPSTICK OB
Blood, UA: NEGATIVE
GLUCOSE, UA: NEGATIVE — AB
KETONES UA: NEGATIVE
Leukocytes, UA: NEGATIVE
Nitrite, UA: NEGATIVE
POC,PROTEIN,UA: NEGATIVE

## 2017-10-03 MED ORDER — CITRANATAL ASSURE 35-1 & 300 MG PO MISC: ORAL | 11 refills | Status: DC

## 2017-10-03 NOTE — Progress Notes (Addendum)
INITIAL OBSTETRICAL VISIT Patient name: Gina Snow MRN [id number]  Date of birth: [birth year] Chief Complaint:   Initial Prenatal Visit  History of Present Illness:   Gina Snow is a 27 y.o. G10P0 African American female at [redacted]w[redacted]d by 9wk u/s, with an Estimated Date of Delivery: 04/24/18 being seen today for her initial obstetrical visit.   Her obstetrical history is significant for primigravida.   H/O PCOS, pre-diabetes Smoker: ~1ppd prior to pregnancy, now 2-3/day Today she reports no complaints.  Patient's last menstrual period was 08/03/2017. Last pap 2018 at [facility]. Results were: neg per pt Review of Systems:   Pertinent items are noted in HPI Denies cramping/contractions, leakage of fluid, vaginal bleeding, abnormal vaginal discharge w/ itching/odor/irritation, headaches, visual changes, shortness of breath, chest pain, abdominal pain, severe nausea/vomiting, or problems with urination or bowel movements unless otherwise stated above.  Pertinent History Reviewed:  Reviewed past medical,surgical, social, obstetrical and family history.  Reviewed problem list, medications and allergies. OB History  Gravida Para Term Preterm AB Living  1            SAB TAB Ectopic Multiple Live Births               # Outcome Date GA Lbr Len/2nd Weight Sex Delivery Anes PTL Lv  1 Current            Physical Assessment:   Vitals:   10/03/17 1357  BP: 122/62  Pulse: 89  Weight: 217 lb (98.4 kg)  Body mass index is 37.25 kg/m.       Physical Examination:  General appearance - well appearing, and in no distress  Mental status - alert, oriented to person, place, and time  Psych:  She has a normal mood and affect  Skin - warm and dry, normal color, no suspicious lesions noted  Chest - effort normal, all lung fields clear to auscultation bilaterally  Heart - normal rate and regular rhythm  Abdomen - soft, nontender  Extremities:  No swelling or varicosities noted  Thin prep pap is  not done   Fetal Heart Rate (bpm): +u/s via informal transabdominal u/s, +active fetus  Results for orders placed or performed in visit on 10/03/17 (from the past 24 hour(s))  POC Urinalysis Dipstick OB   Collection Time: 10/03/17  2:24 PM  Result Value Ref Range   Color, UA     Clarity, UA     Glucose, UA Negative (A) (none)   Bilirubin, UA     Ketones, UA neg    Spec Grav, UA     Blood, UA neg    pH, UA     POC Protein UA Negative Negative, Trace   Urobilinogen, UA     Nitrite, UA neg    Leukocytes, UA Negative Negative   Appearance     Odor      Assessment & Plan:  1) Low-Risk Pregnancy G1P0 at [redacted]w[redacted]d with an Estimated Date of Delivery: 04/24/18   2) Initial OB visit  3) H/O PCOS  4) H/O prediabetes> will get early GTT  5) Smoker> Smokes 2-3/day, counseled x 3-36mins, advised cessation, discussed risks to fetus while pregnant, to infant pp, and to herself. Offered QuitlineNC, declined.     Meds:  Meds ordered this encounter  Medications  . Prenat w/o [id number] (CITRANATAL ASSURE) 35-1 & 300 MG tablet    Sig: One tablet and one capsule daily    Dispense:  60 tablet    Refill:  11    Order Specific Question:   Supervising Provider    Answer:   Florian Buff [[id number]]    Initial labs obtained Continue prenatal vitamins Reviewed n/v relief measures and warning s/s to report Reviewed recommended weight gain based on pre-gravid BMI Encouraged well-balanced diet Genetic Screening discussed: nt/it not currently available, wants AFP next visit Cystic fibrosis screening discussed requested Ultrasound discussed; fetal survey: requested CCNC completed>spoke w/ Jasmine  Follow-up: Return for asap for 2hr GTT (no visit), then 4wks for LROB and AFP.   Orders Placed This Encounter  Procedures  . Urine Culture  . GC/Chlamydia Probe Amp  . Obstetric Panel, Including HIV  . Urinalysis, Routine w reflex microscopic  . Sickle cell screen  . Cystic Fibrosis Mutation  97  . POC Urinalysis Dipstick OB    Roma Schanz CNM, [facility] 10/03/2017 3:10 PM

## 2017-10-03 NOTE — Addendum Note (Signed)
Addended by: Roma Schanz on: 10/03/2017 03:11 PM   Modules accepted: Orders

## 2017-10-03 NOTE — Patient Instructions (Signed)
Gina Snow, I greatly value your feedback.  If you receive a survey following your visit with [place] today, we appreciate you taking the time to fill it out.  Thanks, Knute Neu, CNM, WHNP-BC   Nausea & Vomiting  Have saltine crackers or pretzels by your bed and eat a few bites before you raise your head out of bed in the morning  Eat small frequent meals throughout the day instead of large meals  Drink plenty of fluids throughout the day to stay hydrated, just don't drink a lot of fluids with your meals.  This can make your stomach fill up faster making you feel sick  Do not brush your teeth right after you eat  Products with real ginger are good for nausea, like ginger ale and ginger hard candy Make sure it says made with real ginger!  Sucking on sour candy like lemon heads is also good for nausea  If your prenatal vitamins make you nauseated, take them at night so you will sleep through the nausea  Sea Bands  If you feel like you need medicine for the nausea & vomiting please let us know  If you are unable to keep any fluids or food down please let us know   Constipation  Drink plenty of fluid, preferably water, throughout the day  Eat foods high in fiber such as fruits, vegetables, and grains  Exercise, such as walking, is a good way to keep your bowels regular  Drink warm fluids, especially warm prune juice, or decaf coffee  Eat a 1/2 cup of real oatmeal (not instant), 1/2 cup applesauce, and 1/2-1 cup warm prune juice every day  If needed, you may take Colace (docusate sodium) stool softener once or twice a day to help keep the stool soft. If you are pregnant, wait until you are out of your first trimester (12-14 weeks of pregnancy)  If you still are having problems with constipation, you may take Miralax once daily as needed to help keep your bowels regular.  If you are pregnant, wait until you are out of your first trimester (12-14 weeks of pregnancy)   First  Trimester of Pregnancy The first trimester of pregnancy is from week 1 until the end of week 12 (months 1 through 3). A week after a sperm fertilizes an egg, the egg will implant on the wall of the uterus. This embryo will begin to develop into a baby. Genes from you and your partner are forming the baby. The female genes determine whether the baby is a boy or a girl. At 6-8 weeks, the eyes and face are formed, and the heartbeat can be seen on ultrasound. At the end of 12 weeks, all the baby's organs are formed.  Now that you are pregnant, you will want to do everything you can to have a healthy baby. Two of the most important things are to get good prenatal care and to follow your health care provider's instructions. Prenatal care is all the medical care you receive before the baby's birth. This care will help prevent, find, and treat any problems during the pregnancy and childbirth. BODY CHANGES Your body goes through many changes during pregnancy. The changes vary from woman to woman.   You may gain or lose a couple of pounds at first.  You may feel sick to your stomach (nauseous) and throw up (vomit). If the vomiting is uncontrollable, call your health care provider.  You may tire easily.  You may develop headaches  that can be relieved by medicines approved by your health care provider.  You may urinate more often. Painful urination may mean you have a bladder infection.  You may develop heartburn as a result of your pregnancy.  You may develop constipation because certain hormones are causing the muscles that push waste through your intestines to slow down.  You may develop hemorrhoids or swollen, bulging veins (varicose veins).  Your breasts may begin to grow larger and become tender. Your nipples may stick out more, and the tissue that surrounds them (areola) may become darker.  Your gums may bleed and may be sensitive to brushing and flossing.  Dark spots or blotches (chloasma, mask  of pregnancy) may develop on your face. This will likely fade after the baby is born.  Your menstrual periods will stop.  You may have a loss of appetite.  You may develop cravings for certain kinds of food.  You may have changes in your emotions from day to day, such as being excited to be pregnant or being concerned that something may go wrong with the pregnancy and baby.  You may have more vivid and strange dreams.  You may have changes in your hair. These can include thickening of your hair, rapid growth, and changes in texture. Some women also have hair loss during or after pregnancy, or hair that feels dry or thin. Your hair will most likely return to normal after your baby is born. WHAT TO EXPECT AT YOUR PRENATAL VISITS During a routine prenatal visit:  You will be weighed to make sure you and the baby are growing normally.  Your blood pressure will be taken.  Your abdomen will be measured to track your baby's growth.  The fetal heartbeat will be listened to starting around week 10 or 12 of your pregnancy.  Test results from any previous visits will be discussed. Your health care provider may ask you:  How you are feeling.  If you are feeling the baby move.  If you have had any abnormal symptoms, such as leaking fluid, bleeding, severe headaches, or abdominal cramping.  If you have any questions. Other tests that may be performed during your first trimester include:  Blood tests to find your blood type and to check for the presence of any previous infections. They will also be used to check for low iron levels (anemia) and Rh antibodies. Later in the pregnancy, blood tests for diabetes will be done along with other tests if problems develop.  Urine tests to check for infections, diabetes, or protein in the urine.  An ultrasound to confirm the proper growth and development of the baby.  An amniocentesis to check for possible genetic problems.  Fetal screens for spina  bifida and Down syndrome.  You may need other tests to make sure you and the baby are doing well. HOME CARE INSTRUCTIONS  Medicines  Follow your health care provider's instructions regarding medicine use. Specific medicines may be either safe or unsafe to take during pregnancy.  Take your prenatal vitamins as directed.  If you develop constipation, try taking a stool softener if your health care provider approves. Diet  Eat regular, well-balanced meals. Choose a variety of foods, such as meat or vegetable-based protein, fish, milk and low-fat dairy products, vegetables, fruits, and whole grain breads and cereals. Your health care provider will help you determine the amount of weight gain that is right for you.  Avoid raw meat and uncooked cheese. These carry germs that can  cause birth defects in the baby.  Eating four or five small meals rather than three large meals a day may help relieve nausea and vomiting. If you start to feel nauseous, eating a few soda crackers can be helpful. Drinking liquids between meals instead of during meals also seems to help nausea and vomiting.  If you develop constipation, eat more high-fiber foods, such as fresh vegetables or fruit and whole grains. Drink enough fluids to keep your urine clear or pale yellow. Activity and Exercise  Exercise only as directed by your health care provider. Exercising will help you:  Control your weight.  Stay in shape.  Be prepared for labor and delivery.  Experiencing pain or cramping in the lower abdomen or low back is a good sign that you should stop exercising. Check with your health care provider before continuing normal exercises.  Try to avoid standing for long periods of time. Move your legs often if you must stand in one place for a long time.  Avoid heavy lifting.  Wear low-heeled shoes, and practice good posture.  You may continue to have sex unless your health care provider directs you  otherwise. Relief of Pain or Discomfort  Wear a good support bra for breast tenderness.   Take warm sitz baths to soothe any pain or discomfort caused by hemorrhoids. Use hemorrhoid cream if your health care provider approves.   Rest with your legs elevated if you have leg cramps or low back pain.  If you develop varicose veins in your legs, wear support hose. Elevate your feet for 15 minutes, 3-4 times a day. Limit salt in your diet. Prenatal Care  Schedule your prenatal visits by the twelfth week of pregnancy. They are usually scheduled monthly at first, then more often in the last 2 months before delivery.  Write down your questions. Take them to your prenatal visits.  Keep all your prenatal visits as directed by your health care provider. Safety  Wear your seat belt at all times when driving.  Make a list of emergency phone numbers, including numbers for family, friends, the hospital, and police and fire departments. General Tips  Ask your health care provider for a referral to a local prenatal education class. Begin classes no later than at the beginning of month 6 of your pregnancy.  Ask for help if you have counseling or nutritional needs during pregnancy. Your health care provider can offer advice or refer you to specialists for help with various needs.  Do not use hot tubs, steam rooms, or saunas.  Do not douche or use tampons or scented sanitary pads.  Do not cross your legs for long periods of time.  Avoid cat litter boxes and soil used by cats. These carry germs that can cause birth defects in the baby and possibly loss of the fetus by miscarriage or stillbirth.  Avoid all smoking, herbs, alcohol, and medicines not prescribed by your health care provider. Chemicals in these affect the formation and growth of the baby.  Schedule a dentist appointment. At home, brush your teeth with a soft toothbrush and be gentle when you floss. SEEK MEDICAL CARE IF:   You have  dizziness.  You have mild pelvic cramps, pelvic pressure, or nagging pain in the abdominal area.  You have persistent nausea, vomiting, or diarrhea.  You have a bad smelling vaginal discharge.  You have pain with urination.  You notice increased swelling in your face, hands, legs, or ankles. SEEK IMMEDIATE MEDICAL CARE IF:  You have a fever.  You are leaking fluid from your vagina.  You have spotting or bleeding from your vagina.  You have severe abdominal cramping or pain.  You have rapid weight gain or loss.  You vomit blood or material that looks like coffee grounds.  You are exposed to [place] measles and have never had them.  You are exposed to fifth disease or chickenpox.  You develop a severe headache.  You have shortness of breath.  You have any kind of trauma, such as from a fall or a car accident. Document Released: 01/25/2001 Document Revised: 06/17/2013 Document Reviewed: 12/11/2012 [facility] Patient Information 2015 [place], [place]. This information is not intended to replace advice given to you by your health care provider. Make sure you discuss any questions you have with your health care provider.

## 2017-10-04 LAB — GC/CHLAMYDIA PROBE AMP
CHLAMYDIA, DNA PROBE: NEGATIVE
NEISSERIA GONORRHOEAE BY PCR: POSITIVE — AB

## 2017-10-05 ENCOUNTER — Other Ambulatory Visit: Payer: Self-pay | Admitting: Women's Health

## 2017-10-05 ENCOUNTER — Ambulatory Visit (INDEPENDENT_AMBULATORY_CARE_PROVIDER_SITE_OTHER): Payer: Medicaid Other | Admitting: *Deleted

## 2017-10-05 ENCOUNTER — Encounter: Payer: Self-pay | Admitting: Women's Health

## 2017-10-05 DIAGNOSIS — O98211 Gonorrhea complicating pregnancy, first trimester: Secondary | ICD-10-CM | POA: Insufficient documentation

## 2017-10-05 DIAGNOSIS — A549 Gonococcal infection, unspecified: Secondary | ICD-10-CM | POA: Diagnosis not present

## 2017-10-05 LAB — URINE CULTURE

## 2017-10-05 MED ORDER — CEFTRIAXONE SODIUM 250 MG IJ SOLR
250.0000 mg | Freq: Once | INTRAMUSCULAR | Status: AC
  Administered 2017-10-05: 250 mg via INTRAMUSCULAR

## 2017-10-05 MED ORDER — AZITHROMYCIN 500 MG PO TABS: 1000.0000 mg | ORAL_TABLET | Freq: Once | ORAL | 0 refills | Status: AC

## 2017-10-05 NOTE — Progress Notes (Signed)
Rocephin 250 mg given IM in left upper outer gluteal. Patient tolerated well. Advised to go pick up Azithromycin when leaving office today. Patient agreeable.

## 2017-10-06 ENCOUNTER — Other Ambulatory Visit: Payer: Medicaid Other

## 2017-10-09 ENCOUNTER — Telehealth: Payer: Self-pay | Admitting: *Deleted

## 2017-10-09 NOTE — Telephone Encounter (Signed)
Spoke to patient regarding gonorrhea and contracting.  Informed patient there was no way for [place] to determine how, when or who she got it from.  Informed she could have been carrying it for a while with no symptoms.  Also, partner was checked same day and was told "no gonorrhea seen" but gonorrhea is usually sent as a lab not visualized.  Advised patient to find out final result. Verbalized understanding.

## 2017-10-10 ENCOUNTER — Other Ambulatory Visit: Payer: Medicaid Other

## 2017-10-11 LAB — OBSTETRIC PANEL, INCLUDING HIV
ANTIBODY SCREEN: NEGATIVE
BASOS: 0 %
Basophils Absolute: 0 10*3/uL (ref 0.0–0.2)
EOS (ABSOLUTE): 0.1 10*3/uL (ref 0.0–0.4)
EOS: 1 %
HEMATOCRIT: 36.7 % (ref 34.0–46.6)
HEMOGLOBIN: 12.5 g/dL (ref 11.1–15.9)
HEP B S AG: NEGATIVE
HIV Screen 4th Generation wRfx: NONREACTIVE
IMMATURE GRANS (ABS): 0.1 10*3/uL (ref 0.0–0.1)
Immature Granulocytes: 1 %
LYMPHS ABS: 2.2 10*3/uL (ref 0.7–3.1)
Lymphs: 16 %
MCH: 32.1 pg (ref 26.6–33.0)
MCHC: 34.1 g/dL (ref 31.5–35.7)
MCV: 94 fL (ref 79–97)
MONOS ABS: 0.9 10*3/uL (ref 0.1–0.9)
Monocytes: 7 %
Neutrophils Absolute: 10.2 10*3/uL — ABNORMAL HIGH (ref 1.4–7.0)
Neutrophils: 75 %
Platelets: 189 10*3/uL (ref 150–450)
RBC: 3.89 x10E6/uL (ref 3.77–5.28)
RDW: 12.8 % (ref 12.3–15.4)
RH TYPE: POSITIVE
RPR: NONREACTIVE
Rubella Antibodies, IGG: 6.02 index (ref >0.99)
WBC: 13.6 10*3/uL — AB (ref 3.4–10.8)

## 2017-10-11 LAB — MICROSCOPIC EXAMINATION: Casts: NONE SEEN /lpf

## 2017-10-11 LAB — GLUCOSE TOLERANCE, 2 HOURS W/ 1HR
GLUCOSE, FASTING: 85 mg/dL (ref 65–91)
Glucose, 1 hour: 86 mg/dL (ref 65–179)
Glucose, 2 hour: 83 mg/dL (ref 65–152)

## 2017-10-11 LAB — URINALYSIS, ROUTINE W REFLEX MICROSCOPIC
Bilirubin, UA: NEGATIVE
Glucose, UA: NEGATIVE
Ketones, UA: NEGATIVE
NITRITE UA: NEGATIVE
PH UA: 7 (ref 5.0–7.5)
Protein, UA: NEGATIVE
RBC, UA: NEGATIVE
Specific Gravity, UA: 1.021 (ref 1.005–1.030)
Urobilinogen, Ur: 1 mg/dL (ref 0.2–1.0)

## 2017-10-11 LAB — CYSTIC FIBROSIS MUTATION 97: GENE DIS ANAL CARRIER INTERP BLD/T-IMP: NOT DETECTED

## 2017-10-11 LAB — SICKLE CELL SCREEN: Sickle Cell Screen: NEGATIVE

## 2017-10-25 ENCOUNTER — Emergency Department (HOSPITAL_COMMUNITY)
Admission: EM | Admit: 2017-10-25 | Discharge: 2017-10-25 | Disposition: A | Discharge status: Home / Self Care | Payer: Medicaid Other | Attending: Emergency Medicine | Admitting: Emergency Medicine

## 2017-10-25 ENCOUNTER — Encounter (HOSPITAL_COMMUNITY): Payer: Self-pay | Admitting: Emergency Medicine

## 2017-10-25 ENCOUNTER — Other Ambulatory Visit: Payer: Self-pay

## 2017-10-25 DIAGNOSIS — R1030 Lower abdominal pain, unspecified: Secondary | ICD-10-CM | POA: Diagnosis present

## 2017-10-25 DIAGNOSIS — J45909 Unspecified asthma, uncomplicated: Secondary | ICD-10-CM | POA: Insufficient documentation

## 2017-10-25 DIAGNOSIS — N390 Urinary tract infection, site not specified: Secondary | ICD-10-CM | POA: Insufficient documentation

## 2017-10-25 DIAGNOSIS — R51 Headache: Secondary | ICD-10-CM | POA: Diagnosis not present

## 2017-10-25 DIAGNOSIS — F17210 Nicotine dependence, cigarettes, uncomplicated: Secondary | ICD-10-CM | POA: Insufficient documentation

## 2017-10-25 DIAGNOSIS — R519 Headache, unspecified: Secondary | ICD-10-CM

## 2017-10-25 DIAGNOSIS — R109 Unspecified abdominal pain: Secondary | ICD-10-CM

## 2017-10-25 LAB — CBC
HCT: 36 % (ref 36.0–46.0)
Hemoglobin: 12 g/dL (ref 12.0–15.0)
MCH: 32.4 pg (ref 26.0–34.0)
MCHC: 33.3 g/dL (ref 30.0–36.0)
MCV: 97.3 fL (ref 78.0–100.0)
PLATELETS: 237 10*3/uL (ref 150–400)
RBC: 3.7 MIL/uL — ABNORMAL LOW (ref 3.87–5.11)
RDW: 13.6 % (ref 11.5–15.5)
WBC: 14.8 10*3/uL — AB (ref 4.0–10.5)

## 2017-10-25 LAB — COMPREHENSIVE METABOLIC PANEL
ALT: 14 U/L (ref 0–44)
AST: 14 U/L — ABNORMAL LOW (ref 15–41)
Albumin: 3.7 g/dL (ref 3.5–5.0)
Alkaline Phosphatase: 47 U/L (ref 38–126)
Anion gap: 5 (ref 5–15)
BUN: 10 mg/dL (ref 6–20)
CHLORIDE: 107 mmol/L (ref 98–111)
CO2: 23 mmol/L (ref 22–32)
CREATININE: 0.62 mg/dL (ref 0.44–1.00)
Calcium: 8.8 mg/dL — ABNORMAL LOW (ref 8.9–10.3)
GFR calc Af Amer: >60 mL/min (ref >60)
Glucose, Bld: 79 mg/dL (ref 70–99)
POTASSIUM: 3.6 mmol/L (ref 3.5–5.1)
Sodium: 135 mmol/L (ref 135–145)
Total Bilirubin: 0.2 mg/dL — ABNORMAL LOW (ref 0.3–1.2)
Total Protein: 6.8 g/dL (ref 6.5–8.1)

## 2017-10-25 LAB — URINALYSIS, ROUTINE W REFLEX MICROSCOPIC
Bilirubin Urine: NEGATIVE
Glucose, UA: NEGATIVE mg/dL
Hgb urine dipstick: NEGATIVE
KETONES UR: NEGATIVE mg/dL
Nitrite: NEGATIVE
PH: 6 (ref 5.0–8.0)
Protein, ur: NEGATIVE mg/dL
SPECIFIC GRAVITY, URINE: 1.025 (ref 1.005–1.030)

## 2017-10-25 LAB — HCG, QUANTITATIVE, PREGNANCY: hCG, Beta Chain, Quant, S: 47025 m[IU]/mL — ABNORMAL HIGH (ref <5)

## 2017-10-25 MED ORDER — CEPHALEXIN 500 MG PO CAPS: 500.0000 mg | ORAL_CAPSULE | Freq: Four times a day (QID) | ORAL | 0 refills | Status: DC

## 2017-10-25 MED ORDER — CEPHALEXIN 500 MG PO CAPS
500.0000 mg | ORAL_CAPSULE | Freq: Once | ORAL | Status: AC
  Administered 2017-10-25: 500 mg via ORAL
  Filled 2017-10-25: qty 1

## 2017-10-25 NOTE — ED Triage Notes (Addendum)
Pt reports intermittent abd cramps and back pain with headaches at night. Pt reports is approximately [redacted] weeks pregnant. Pt reports is seen at [facility]. Pt denies any vaginal bleeding.

## 2017-10-25 NOTE — ED Provider Notes (Signed)
[facility] EMERGENCY DEPARTMENT Provider Note   CSN: [id number] Arrival date & time: 10/25/17  1658     History   Chief Complaint Chief Complaint  Patient presents with  . Abdominal Pain    HPI Gina Snow is a 27 y.o. female.   Abdominal Pain   This is a new problem. The current episode started more than 2 days ago. The problem occurs daily. The problem has been gradually worsening. Associated with: positions. The pain is located in the suprapubic region, RLQ and LLQ. The pain is moderate. The symptoms are aggravated by certain positions.    Past Medical History:  Diagnosis Date  . Asthma   . Obesity   . PCO (polycystic ovaries)     Patient Active Problem List   Diagnosis Date Noted  . Gonorrhea affecting pregnancy in first trimester 10/05/2017  . Supervision of normal first pregnancy 10/03/2017  . Glucose intolerance (impaired glucose tolerance) 12/21/2012  . PCOS (polycystic ovarian syndrome) 12/21/2012  . Prediabetes 10/26/2012  . Hyperglycemia 09/24/2012  . Tobacco use 09/17/2012    Past Surgical History:  Procedure Laterality Date  . NO PAST SURGERIES       OB History    Gravida  1   Para      Term      Preterm      AB      Living        SAB      TAB      Ectopic      Multiple      Live Births               Home Medications    Prior to Admission medications   Medication Sig Start Date End Date Taking? Authorizing Provider  acetaminophen (TYLENOL) 500 MG tablet Take 500 mg by mouth every 6 (six) hours as needed for mild pain.   Yes [provider]  Prenat w/o [id number] (CITRANATAL ASSURE) 35-1 & 300 MG tablet One tablet and one capsule daily Patient taking differently: Take 2 tablets by mouth daily.  10/03/17  Yes Roma Schanz, CNM  cephALEXin (KEFLEX) 500 MG capsule Take 1 capsule (500 mg total) by mouth 4 (four) times daily. 10/25/17   Ayona Yniguez, Corene Cornea, MD    Family History Family History    Problem Relation Age of Onset  . Clotting disorder Mother 55       had "brain" clot?  Marland Kitchen Hypertension Maternal Grandmother   . Diabetes Maternal Grandmother   . Thyroid disease Maternal Grandmother   . Hypertension Maternal Grandfather   . Diabetes Paternal Grandmother     Social History Social History   Tobacco Use  . Smoking status: Current Every Day Smoker    Packs/day: 0.25    Years: 8.00    Pack years: 2.00    Types: Cigarettes  . Smokeless tobacco: Never Used  . Tobacco comment: 2-3 per day  Substance Use Topics  . Alcohol use: Not Currently    Comment: every once in a while  . Drug use: No     Allergies   Patient has no known allergies.   Review of Systems Review of Systems  Gastrointestinal: Positive for abdominal pain.  All other systems reviewed and are negative.    Physical Exam Updated Vital Signs BP 127/78 (BP Location: Left Arm)   Pulse 77   Temp 98.7 F (37.1 C) (Oral)   Resp 18   Ht 5\' 4"  (1.626 m)  Wt 99.8 kg   LMP 08/03/2017   SpO2 100%   BMI 37.76 kg/m   Physical Exam  Constitutional: She appears well-developed and well-nourished.  HENT:  Head: Normocephalic and atraumatic.  Eyes: Conjunctivae and EOM are normal.  Neck: Normal range of motion.  Cardiovascular: Normal rate and regular rhythm.  Pulmonary/Chest: Effort normal. No stridor. No respiratory distress.  Abdominal: Soft. Bowel sounds are normal. She exhibits no distension.  Musculoskeletal: Normal range of motion. She exhibits no edema or deformity.  Neurological: She is alert.  Skin: Skin is warm and dry.  Nursing note and vitals reviewed.    ED Treatments / Results  Labs (all labs ordered are listed, but only abnormal results are displayed) Labs Reviewed  COMPREHENSIVE METABOLIC PANEL - Abnormal; Notable for the following components:      Result Value   Calcium 8.8 (*)    AST 14 (*)    Total Bilirubin 0.2 (*)    All other components within normal limits  CBC  - Abnormal; Notable for the following components:   WBC 14.8 (*)    RBC 3.70 (*)    All other components within normal limits  URINALYSIS, ROUTINE W REFLEX MICROSCOPIC - Abnormal; Notable for the following components:   Leukocytes, UA SMALL (*)    Bacteria, UA RARE (*)    All other components within normal limits  HCG, QUANTITATIVE, PREGNANCY - Abnormal; Notable for the following components:   hCG, Beta Chain, Quant, S 47,025 (*)    All other components within normal limits  URINE CULTURE    EKG None  Radiology No results found.  Procedures Procedures (including critical care time)  Medications Ordered in ED Medications  cephALEXin (KEFLEX) capsule 500 mg (500 mg Oral Given 10/25/17 1933)     Initial Impression / Assessment and Plan / ED Course  I have reviewed the triage vital signs and the nursing notes.  Pertinent labs & imaging results that were available during my care of the patient were reviewed by me and considered in my medical decision making (see chart for details).   Neri tract infection may be related some of her symptoms however is probably like some ligamentous pain as well with the pregnancy.  Headache may be related to stress or dehydration unclear.  Low suspicion for any intracranial etiology.  No evidence of preeclampsia or other emergent issues at this time.  Final Clinical Impressions(s) / ED Diagnoses   Final diagnoses:  Lower urinary tract infectious disease  Abdominal pain, unspecified abdominal location  Nonintractable headache, unspecified chronicity pattern, unspecified headache type    ED Discharge Orders         Ordered    cephALEXin (KEFLEX) 500 MG capsule  4 times daily     10/25/17 1929           Cale Decarolis, Corene Cornea, MD 10/25/17 2157

## 2017-10-27 ENCOUNTER — Emergency Department (HOSPITAL_COMMUNITY)
Admission: EM | Admit: 2017-10-27 | Discharge: 2017-10-27 | Disposition: A | Discharge status: Home / Self Care | Payer: Medicaid Other | Attending: Emergency Medicine | Admitting: Emergency Medicine

## 2017-10-27 ENCOUNTER — Encounter (HOSPITAL_COMMUNITY): Payer: Self-pay | Admitting: Emergency Medicine

## 2017-10-27 ENCOUNTER — Emergency Department (HOSPITAL_COMMUNITY): Payer: Medicaid Other

## 2017-10-27 ENCOUNTER — Other Ambulatory Visit: Payer: Self-pay

## 2017-10-27 DIAGNOSIS — F17210 Nicotine dependence, cigarettes, uncomplicated: Secondary | ICD-10-CM | POA: Insufficient documentation

## 2017-10-27 DIAGNOSIS — S0083XA Contusion of other part of head, initial encounter: Secondary | ICD-10-CM | POA: Diagnosis not present

## 2017-10-27 DIAGNOSIS — Z79899 Other long term (current) drug therapy: Secondary | ICD-10-CM | POA: Diagnosis not present

## 2017-10-27 DIAGNOSIS — Y939 Activity, unspecified: Secondary | ICD-10-CM | POA: Insufficient documentation

## 2017-10-27 DIAGNOSIS — Y998 Other external cause status: Secondary | ICD-10-CM | POA: Insufficient documentation

## 2017-10-27 DIAGNOSIS — Z331 Pregnant state, incidental: Secondary | ICD-10-CM | POA: Insufficient documentation

## 2017-10-27 DIAGNOSIS — Z0471 Encounter for examination and observation following alleged adult physical abuse: Secondary | ICD-10-CM | POA: Diagnosis present

## 2017-10-27 DIAGNOSIS — Y929 Unspecified place or not applicable: Secondary | ICD-10-CM | POA: Diagnosis not present

## 2017-10-27 LAB — URINE CULTURE

## 2017-10-27 MED ORDER — ACETAMINOPHEN 500 MG PO TABS
1000.0000 mg | ORAL_TABLET | Freq: Once | ORAL | Status: AC
  Administered 2017-10-27: 1000 mg via ORAL
  Filled 2017-10-27: qty 2
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
## 2017-10-27 NOTE — Discharge Instructions (Signed)
Your testing is normal, there is no signs of broken bones, take Tylenol and use ice packs for pain relief and swelling.  You should be much better within several days.  Avoid anti-inflammatory such as ibuprofen as these may cause harm to your baby.
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
## 2017-10-31 ENCOUNTER — Other Ambulatory Visit (HOSPITAL_COMMUNITY)
Admission: RE | Admit: 2017-10-31 | Discharge: 2017-10-31 | Disposition: A | Discharge status: Home / Self Care | Payer: Medicaid Other | Source: Ambulatory Visit | Attending: Advanced Practice Midwife | Admitting: Advanced Practice Midwife

## 2017-10-31 ENCOUNTER — Encounter: Payer: Self-pay | Admitting: Advanced Practice Midwife

## 2017-10-31 ENCOUNTER — Ambulatory Visit (INDEPENDENT_AMBULATORY_CARE_PROVIDER_SITE_OTHER): Payer: Medicaid Other | Admitting: Advanced Practice Midwife

## 2017-10-31 VITALS — BP 105/69 | HR 78 | Wt 219.0 lb

## 2017-10-31 DIAGNOSIS — Z124 Encounter for screening for malignant neoplasm of cervix: Secondary | ICD-10-CM | POA: Diagnosis present

## 2017-10-31 DIAGNOSIS — Z1389 Encounter for screening for other disorder: Secondary | ICD-10-CM

## 2017-10-31 DIAGNOSIS — Z3402 Encounter for supervision of normal first pregnancy, second trimester: Secondary | ICD-10-CM

## 2017-10-31 DIAGNOSIS — Z363 Encounter for antenatal screening for malformations: Secondary | ICD-10-CM

## 2017-10-31 DIAGNOSIS — D069 Carcinoma in situ of cervix, unspecified: Secondary | ICD-10-CM | POA: Insufficient documentation

## 2017-10-31 DIAGNOSIS — Z3A15 15 weeks gestation of pregnancy: Secondary | ICD-10-CM

## 2017-10-31 DIAGNOSIS — Z331 Pregnant state, incidental: Secondary | ICD-10-CM

## 2017-10-31 DIAGNOSIS — Z1379 Encounter for other screening for genetic and chromosomal anomalies: Secondary | ICD-10-CM

## 2017-10-31 LAB — POCT URINALYSIS DIPSTICK OB
GLUCOSE, UA: NEGATIVE
Ketones, UA: NEGATIVE
Leukocytes, UA: NEGATIVE
Nitrite, UA: NEGATIVE
POC,PROTEIN,UA: NEGATIVE
RBC UA: NEGATIVE
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
## 2017-10-31 NOTE — Patient Instructions (Addendum)
Center for [facility] at [facility] for [facility]  Snow,  Gina  [id number]   [name], I greatly value your feedback.  If you receive a survey following your visit with [place] today, we appreciate you taking the time to fill it out.  Thanks, Gina Snow, CNM     Second Trimester of Pregnancy The second trimester is from week 14 through week 27 (months 4 through 6). The second trimester is often a time when you feel your best. Your body has adjusted to being pregnant, and you begin to feel better physically. Usually, morning sickness has lessened or quit completely, you may have more energy, and you may have an increase in appetite. The second trimester is also a time when the fetus is growing rapidly. At the end of the sixth month, the fetus is about 9 inches long and weighs about 1 pounds. You will likely begin to feel the baby move (quickening) between 16 and 20 weeks of pregnancy. Body changes during your second trimester Your body continues to go through many changes during your second trimester. The changes vary from woman to woman.  Your weight will continue to increase. You will notice your lower abdomen bulging out.  You may begin to get stretch marks on your hips, abdomen, and breasts.  You may develop headaches that can be relieved by medicines. The medicines should be approved by your health care provider.  You may urinate more often because the fetus is pressing on your bladder.  You may develop or continue to have heartburn as a result of your pregnancy.  You may develop constipation because certain hormones are causing the muscles that push waste through your intestines to slow down.  You may develop hemorrhoids or swollen, bulging veins (varicose veins).  You may have back pain. This is caused by: ? Weight gain. ? Pregnancy hormones that are relaxing the joints in your pelvis. ? A shift in  weight and the muscles that support your balance.  Your breasts will continue to grow and they will continue to become tender.  Your gums may bleed and may be sensitive to brushing and flossing.  Dark spots or blotches (chloasma, mask of pregnancy) may develop on your face. This will likely fade after the baby is born.  A dark line from your belly button to the pubic area (linea nigra) may appear. This will likely fade after the baby is born.  You may have changes in your hair. These can include thickening of your hair, rapid growth, and changes in texture. Some women also have hair loss during or after pregnancy, or hair that feels dry or thin. Your hair will most likely return to normal after your baby is born.  What to expect at prenatal visits During a routine prenatal visit:  You will be weighed to make sure you and the fetus are growing normally.  Your blood pressure will be taken.  Your abdomen will be measured to track your baby's growth.  The fetal heartbeat will be listened to.  Any test results from the previous visit will be discussed.  Your health care provider may ask you:  How you are feeling.  If you are feeling the baby move.  If you have had any abnormal symptoms, such as leaking fluid, bleeding, severe headaches, or abdominal cramping.  If you are using any tobacco products, including cigarettes, chewing tobacco, and electronic cigarettes.  If you have any questions.  Other tests that may be performed during your second trimester include:  Blood tests that check for: ? Low iron levels (anemia). ? High blood sugar that affects pregnant women (gestational diabetes) between 62 and 28 weeks. ? Rh antibodies. This is to check for a protein on red blood cells (Rh factor).  Urine tests to check for infections, diabetes, or protein in the urine.  An ultrasound to confirm the proper growth and development of the baby.  An amniocentesis to check for possible  genetic problems.  Fetal screens for spina bifida and Down syndrome.  HIV (human immunodeficiency virus) testing. Routine prenatal testing includes screening for HIV, unless you choose not to have this test.  Follow these instructions at home: Medicines  Follow your health care provider's instructions regarding medicine use. Specific medicines may be either safe or unsafe to take during pregnancy.  Take a prenatal vitamin that contains at least 600 micrograms (mcg) of folic acid.  If you develop constipation, try taking a stool softener if your health care provider approves. Eating and drinking  Eat a balanced diet that includes fresh fruits and vegetables, whole grains, good sources of protein such as meat, eggs, or tofu, and low-fat dairy. Your health care provider will help you determine the amount of weight gain that is right for you.  Avoid raw meat and uncooked cheese. These carry germs that can cause birth defects in the baby.  If you have low calcium intake from food, talk to your health care provider about whether you should take a daily calcium supplement.  Limit foods that are high in fat and processed sugars, such as fried and sweet foods.  To prevent constipation: ? Drink enough fluid to keep your urine clear or pale yellow. ? Eat foods that are high in fiber, such as fresh fruits and vegetables, whole grains, and beans. Activity  Exercise only as directed by your health care provider. Most women can continue their usual exercise routine during pregnancy. Try to exercise for 30 minutes at least 5 days a week. Stop exercising if you experience uterine contractions.  Avoid heavy lifting, wear low heel shoes, and practice good posture.  A sexual relationship may be continued unless your health care provider directs you otherwise. Relieving pain and discomfort  Wear a good support bra to prevent discomfort from breast tenderness.  Take warm sitz baths to soothe any pain  or discomfort caused by hemorrhoids. Use hemorrhoid cream if your health care provider approves.  Rest with your legs elevated if you have leg cramps or low back pain.  If you develop varicose veins, wear support hose. Elevate your feet for 15 minutes, 3-4 times a day. Limit salt in your diet. Prenatal Care  Write down your questions. Take them to your prenatal visits.  Keep all your prenatal visits as told by your health care provider. This is important. Safety  Wear your seat belt at all times when driving.  Make a list of emergency phone numbers, including numbers for family, friends, the hospital, and police and fire departments. General instructions  Ask your health care provider for a referral to a local prenatal education class. Begin classes no later than the beginning of month 6 of your pregnancy.  Ask for help if you have counseling or nutritional needs during pregnancy. Your health care provider can offer advice or refer you to specialists for help with various needs.  Do not use hot tubs, steam rooms, or saunas.  Do not douche or  use tampons or scented sanitary pads.  Do not cross your legs for long periods of time.  Avoid cat litter boxes and soil used by cats. These carry germs that can cause birth defects in the baby and possibly loss of the fetus by miscarriage or stillbirth.  Avoid all smoking, herbs, alcohol, and unprescribed drugs. Chemicals in these products can affect the formation and growth of the baby.  Do not use any products that contain nicotine or tobacco, such as cigarettes and e-cigarettes. If you need help quitting, ask your health care provider.  Visit your dentist if you have not gone yet during your pregnancy. Use a soft toothbrush to brush your teeth and be gentle when you floss. Contact a health care provider if:  You have dizziness.  You have mild pelvic cramps, pelvic pressure, or nagging pain in the abdominal area.  You have persistent  nausea, vomiting, or diarrhea.  You have a bad smelling vaginal discharge.  You have pain when you urinate. Get help right away if:  You have a fever.  You are leaking fluid from your vagina.  You have spotting or bleeding from your vagina.  You have severe abdominal cramping or pain.  You have rapid weight gain or weight loss.  You have shortness of breath with chest pain.  You notice sudden or extreme swelling of your face, hands, ankles, feet, or legs.  You have not felt your baby move in over an hour.  You have severe headaches that do not go away when you take medicine.  You have vision changes. Summary  The second trimester is from week 14 through week 27 (months 4 through 6). It is also a time when the fetus is growing rapidly.  Your body goes through many changes during pregnancy. The changes vary from woman to woman.  Avoid all smoking, herbs, alcohol, and unprescribed drugs. These chemicals affect the formation and growth your baby.  Do not use any tobacco products, such as cigarettes, chewing tobacco, and e-cigarettes. If you need help quitting, ask your health care provider.  Contact your health care provider if you have any questions. Keep all prenatal visits as told by your health care provider. This is important. This information is not intended to replace advice given to you by your health care provider. Make sure you discuss any questions you have with your health care provider.      CHILDBIRTH CLASSES [phone] is the phone number for Pregnancy Classes or hospital tours at [place] will be referred to  [email or web address] for more information on childbirth classes  At this site you may register for classes. You may sign up for a waiting list if classes are full. Please SIGN UP FOR THIS!.   When the waiting list becomes long, sometimes new classes  can be added.

## 2017-11-02 LAB — AFP TETRA
DIA Mom Value: 1.89
DIA Value (EIA): 288.83 pg/mL
DSR (BY AGE) 1 IN: 1022
DSR (Second Trimester) 1 IN: 422
Gestational Age: 15 WEEKS
MSAFP Mom: 0.92
MSAFP: 22.7 ng/mL
MSHCG Mom: 0.98
MSHCG: 46552 m[IU]/mL
Maternal Age At EDD: 25.2 yr
Osb Risk: 10000
Test Results:: NEGATIVE
WEIGHT: 219 [lb_av]
uE3 Mom: 0.28
uE3 Value: 0.17 ng/mL

## 2017-11-03 LAB — CYTOLOGY - PAP
Chlamydia: NEGATIVE
Diagnosis: HIGH — AB
Neisseria Gonorrhea: NEGATIVE

## 2017-11-09 ENCOUNTER — Ambulatory Visit (INDEPENDENT_AMBULATORY_CARE_PROVIDER_SITE_OTHER): Payer: Medicaid Other | Admitting: Obstetrics & Gynecology

## 2017-11-09 ENCOUNTER — Other Ambulatory Visit: Payer: Self-pay

## 2017-11-09 ENCOUNTER — Encounter: Payer: Self-pay | Admitting: Obstetrics & Gynecology

## 2017-11-09 VITALS — BP 120/71 | HR 88 | Wt 224.0 lb

## 2017-11-09 DIAGNOSIS — R87613 High grade squamous intraepithelial lesion on cytologic smear of cervix (HGSIL): Secondary | ICD-10-CM

## 2017-11-09 DIAGNOSIS — Z3A16 16 weeks gestation of pregnancy: Secondary | ICD-10-CM

## 2017-11-09 DIAGNOSIS — Z331 Pregnant state, incidental: Secondary | ICD-10-CM

## 2017-11-09 DIAGNOSIS — Z23 Encounter for immunization: Secondary | ICD-10-CM | POA: Diagnosis not present

## 2017-11-09 DIAGNOSIS — Z1389 Encounter for screening for other disorder: Secondary | ICD-10-CM

## 2017-11-09 DIAGNOSIS — Z3402 Encounter for supervision of normal first pregnancy, second trimester: Secondary | ICD-10-CM

## 2017-11-09 LAB — POCT URINALYSIS DIPSTICK OB
Blood, UA: NEGATIVE
Glucose, UA: NEGATIVE
KETONES UA: NEGATIVE
LEUKOCYTES UA: NEGATIVE
Nitrite, UA: NEGATIVE
POC,PROTEIN,UA: NEGATIVE

## 2017-11-09 NOTE — Progress Notes (Signed)
Colposcopy Procedure Note:  Colposcopy Procedure Note  Indications: Pap smear 2 months ago showed: high-grade squamous intraepithelial neoplasia  (HGSIL-encompassing moderate and severe dysplasia). The prior pap showed no abnormalities.  Prior cervical/vaginal disease: normal exam . Prior cervical treatment: .  Smoker:  No. New sexual partner:  No.  : time frame:    History of abnormal Pap: no  Procedure Details  The risks and benefits of the procedure and Written informed consent obtained.  Speculum placed in vagina and excellent visualization of cervix achieved, cervix swabbed x 3 with acetic acid solution.  Findings: Cervix: no visible lesions, no mosaicism, no punctation and no abnormal vasculature; SCJ visualized 360 degrees without lesions and no biopsies taken. Vaginal inspection: normal without visible lesions. Vulvar colposcopy: vulvar colposcopy not performed.  Specimens: none  Complications: none.  Plan: Repeat colposcopy 6-8 weeks after delivery    LOW-RISK PREGNANCY VISIT Patient name: Gina Snow MRN [id number]  Date of birth: 08-13-1990 Chief Complaint:   Routine Prenatal Visit  History of Present Illness:   Gina Snow is a 27 y.o. G1P0 female at [redacted]w[redacted]d with an Estimated Date of Delivery: 04/24/18 being seen today for ongoing management of a low-risk pregnancy.  Today she reports no complaints.  . Vag. Bleeding: None.   . denies leaking of fluid. Review of Systems:   Pertinent items are noted in HPI Denies abnormal vaginal discharge w/ itching/odor/irritation, headaches, visual changes, shortness of breath, chest pain, abdominal pain, severe nausea/vomiting, or problems with urination or bowel movements unless otherwise stated above. Pertinent History Reviewed:  Reviewed past medical,surgical, social, obstetrical and family history.  Reviewed problem list, medications and allergies. Physical Assessment:   Vitals:   11/09/17 1042  BP: 120/71    Pulse: 88  Weight: 224 lb (101.6 kg)  Body mass index is 38.45 kg/m.        Physical Examination:   General appearance: Well appearing, and in no distress  Mental status: Alert, oriented to person, place, and time  Skin: Warm & dry  Cardiovascular: Normal heart rate noted  Respiratory: Normal respiratory effort, no distress  Abdomen: Soft, gravid, nontender  Pelvic: Cervical exam deferred         Extremities: Edema: None  Fetal Status:          Results for orders placed or performed in visit on 11/09/17 (from the past 24 hour(s))  POC Urinalysis Dipstick OB   Collection Time: 11/09/17 10:41 AM  Result Value Ref Range   Color, UA     Clarity, UA     Glucose, UA Negative Negative   Bilirubin, UA     Ketones, UA neg    Spec Grav, UA     Blood, UA neg    pH, UA     POC Protein UA Negative Negative, Trace   Urobilinogen, UA     Nitrite, UA neg    Leukocytes, UA Negative Negative   Appearance     Odor      Assessment & Plan:  1) Low-risk pregnancy G1P0 at [redacted]w[redacted]d with an Estimated Date of Delivery: 04/24/18   2) HSIL on Pap, colpo today is negative, repeat 6-8 weeks pp   Meds: No orders of the defined types were placed in this encounter.  Labs/procedures today: colposcopy  Plan:  Continue routine obstetrical care   Reviewed: Preterm labor symptoms and general obstetric precautions including but not limited to vaginal bleeding, contractions, leaking of fluid and fetal movement were reviewed in detail with  the patient.  All questions were answered  Follow-up: Return in about 3 weeks (around 11/30/2017) for 20 week sono, LROB.  Orders Placed This Encounter  Procedures  . US OB Comp + 14 Wk  . Flu Vaccine QUAD 36+ mos IM  . POC Urinalysis Dipstick OB   Mertie Clause Eure  11/09/2017 11:11 AM

## 2017-11-22 ENCOUNTER — Ambulatory Visit (INDEPENDENT_AMBULATORY_CARE_PROVIDER_SITE_OTHER): Payer: Medicaid Other

## 2017-11-22 ENCOUNTER — Ambulatory Visit (INDEPENDENT_AMBULATORY_CARE_PROVIDER_SITE_OTHER): Payer: Medicaid Other | Admitting: Advanced Practice Midwife

## 2017-11-22 ENCOUNTER — Encounter: Payer: Self-pay | Admitting: Advanced Practice Midwife

## 2017-11-22 VITALS — BP 121/78 | HR 93 | Wt 229.0 lb

## 2017-11-22 DIAGNOSIS — IMO0002 Reserved for concepts with insufficient information to code with codable children: Secondary | ICD-10-CM

## 2017-11-22 DIAGNOSIS — Z363 Encounter for antenatal screening for malformations: Secondary | ICD-10-CM

## 2017-11-22 DIAGNOSIS — Z331 Pregnant state, incidental: Secondary | ICD-10-CM

## 2017-11-22 DIAGNOSIS — Z3402 Encounter for supervision of normal first pregnancy, second trimester: Secondary | ICD-10-CM

## 2017-11-22 DIAGNOSIS — Z1389 Encounter for screening for other disorder: Secondary | ICD-10-CM

## 2017-11-22 DIAGNOSIS — Z0489 Encounter for examination and observation for other specified reasons: Secondary | ICD-10-CM

## 2017-11-22 DIAGNOSIS — Z3A18 18 weeks gestation of pregnancy: Secondary | ICD-10-CM

## 2017-11-22 LAB — POCT URINALYSIS DIPSTICK OB
GLUCOSE, UA: NEGATIVE
KETONES UA: NEGATIVE
LEUKOCYTES UA: NEGATIVE
NITRITE UA: NEGATIVE
POC,PROTEIN,UA: NEGATIVE
RBC UA: NEGATIVE

## 2017-11-22 NOTE — Patient Instructions (Signed)
Gina Snow, I greatly value your feedback.  If you receive a survey following your visit with [place] today, we appreciate you taking the time to fill it out.  Thanks, Nigel Berthold, CNM     Second Trimester of Pregnancy The second trimester is from week 14 through week 27 (months 4 through 6). The second trimester is often a time when you feel your best. Your body has adjusted to being pregnant, and you begin to feel better physically. Usually, morning sickness has lessened or quit completely, you may have more energy, and you may have an increase in appetite. The second trimester is also a time when the fetus is growing rapidly. At the end of the sixth month, the fetus is about 9 inches long and weighs about 1 pounds. You will likely begin to feel the baby move (quickening) between 16 and 20 weeks of pregnancy. Body changes during your second trimester Your body continues to go through many changes during your second trimester. The changes vary from woman to woman.  Your weight will continue to increase. You will notice your lower abdomen bulging out.  You may begin to get stretch marks on your hips, abdomen, and breasts.  You may develop headaches that can be relieved by medicines. The medicines should be approved by your health care provider.  You may urinate more often because the fetus is pressing on your bladder.  You may develop or continue to have heartburn as a result of your pregnancy.  You may develop constipation because certain hormones are causing the muscles that push waste through your intestines to slow down.  You may develop hemorrhoids or swollen, bulging veins (varicose veins).  You may have back pain. This is caused by: ? Weight gain. ? Pregnancy hormones that are relaxing the joints in your pelvis. ? A shift in weight and the muscles that support your balance.  Your breasts will continue to grow and they will continue to become tender.  Your gums may  bleed and may be sensitive to brushing and flossing.  Dark spots or blotches (chloasma, mask of pregnancy) may develop on your face. This will likely fade after the baby is born.  A dark line from your belly button to the pubic area (linea nigra) may appear. This will likely fade after the baby is born.  You may have changes in your hair. These can include thickening of your hair, rapid growth, and changes in texture. Some women also have hair loss during or after pregnancy, or hair that feels dry or thin. Your hair will most likely return to normal after your baby is born.  What to expect at prenatal visits During a routine prenatal visit:  You will be weighed to make sure you and the fetus are growing normally.  Your blood pressure will be taken.  Your abdomen will be measured to track your baby's growth.  The fetal heartbeat will be listened to.  Any test results from the previous visit will be discussed.  Your health care provider may ask you:  How you are feeling.  If you are feeling the baby move.  If you have had any abnormal symptoms, such as leaking fluid, bleeding, severe headaches, or abdominal cramping.  If you are using any tobacco products, including cigarettes, chewing tobacco, and electronic cigarettes.  If you have any questions.  Other tests that may be performed during your second trimester include:  Blood tests that check for: ? Low iron levels (anemia). ?  High blood sugar that affects pregnant women (gestational diabetes) between 20 and 28 weeks. ? Rh antibodies. This is to check for a protein on red blood cells (Rh factor).  Urine tests to check for infections, diabetes, or protein in the urine.  An ultrasound to confirm the proper growth and development of the baby.  An amniocentesis to check for possible genetic problems.  Fetal screens for spina bifida and Down syndrome.  HIV (human immunodeficiency virus) testing. Routine prenatal testing  includes screening for HIV, unless you choose not to have this test.  Follow these instructions at home: Medicines  Follow your health care provider's instructions regarding medicine use. Specific medicines may be either safe or unsafe to take during pregnancy.  Take a prenatal vitamin that contains at least 600 micrograms (mcg) of folic acid.  If you develop constipation, try taking a stool softener if your health care provider approves. Eating and drinking  Eat a balanced diet that includes fresh fruits and vegetables, whole grains, good sources of protein such as meat, eggs, or tofu, and low-fat dairy. Your health care provider will help you determine the amount of weight gain that is right for you.  Avoid raw meat and uncooked cheese. These carry germs that can cause birth defects in the baby.  If you have low calcium intake from food, talk to your health care provider about whether you should take a daily calcium supplement.  Limit foods that are high in fat and processed sugars, such as fried and sweet foods.  To prevent constipation: ? Drink enough fluid to keep your urine clear or pale yellow. ? Eat foods that are high in fiber, such as fresh fruits and vegetables, whole grains, and beans. Activity  Exercise only as directed by your health care provider. Most women can continue their usual exercise routine during pregnancy. Try to exercise for 30 minutes at least 5 days a week. Stop exercising if you experience uterine contractions.  Avoid heavy lifting, wear low heel shoes, and practice good posture.  A sexual relationship may be continued unless your health care provider directs you otherwise. Relieving pain and discomfort  Wear a good support bra to prevent discomfort from breast tenderness.  Take warm sitz baths to soothe any pain or discomfort caused by hemorrhoids. Use hemorrhoid cream if your health care provider approves.  Rest with your legs elevated if you have  leg cramps or low back pain.  If you develop varicose veins, wear support hose. Elevate your feet for 15 minutes, 3-4 times a day. Limit salt in your diet. Prenatal Care  Write down your questions. Take them to your prenatal visits.  Keep all your prenatal visits as told by your health care provider. This is important. Safety  Wear your seat belt at all times when driving.  Make a list of emergency phone numbers, including numbers for family, friends, the hospital, and police and fire departments. General instructions  Ask your health care provider for a referral to a local prenatal education class. Begin classes no later than the beginning of month 6 of your pregnancy.  Ask for help if you have counseling or nutritional needs during pregnancy. Your health care provider can offer advice or refer you to specialists for help with various needs.  Do not use hot tubs, steam rooms, or saunas.  Do not douche or use tampons or scented sanitary pads.  Do not cross your legs for long periods of time.  Avoid cat litter boxes and  soil used by cats. These carry germs that can cause birth defects in the baby and possibly loss of the fetus by miscarriage or stillbirth.  Avoid all smoking, herbs, alcohol, and unprescribed drugs. Chemicals in these products can affect the formation and growth of the baby.  Do not use any products that contain nicotine or tobacco, such as cigarettes and e-cigarettes. If you need help quitting, ask your health care provider.  Visit your dentist if you have not gone yet during your pregnancy. Use a soft toothbrush to brush your teeth and be gentle when you floss. Contact a health care provider if:  You have dizziness.  You have mild pelvic cramps, pelvic pressure, or nagging pain in the abdominal area.  You have persistent nausea, vomiting, or diarrhea.  You have a bad smelling vaginal discharge.  You have pain when you urinate. Get help right away if:  You  have a fever.  You are leaking fluid from your vagina.  You have spotting or bleeding from your vagina.  You have severe abdominal cramping or pain.  You have rapid weight gain or weight loss.  You have shortness of breath with chest pain.  You notice sudden or extreme swelling of your face, hands, ankles, feet, or legs.  You have not felt your baby move in over an hour.  You have severe headaches that do not go away when you take medicine.  You have vision changes. Summary  The second trimester is from week 14 through week 27 (months 4 through 6). It is also a time when the fetus is growing rapidly.  Your body goes through many changes during pregnancy. The changes vary from woman to woman.  Avoid all smoking, herbs, alcohol, and unprescribed drugs. These chemicals affect the formation and growth your baby.  Do not use any tobacco products, such as cigarettes, chewing tobacco, and e-cigarettes. If you need help quitting, ask your health care provider.  Contact your health care provider if you have any questions. Keep all prenatal visits as told by your health care provider. This is important. This information is not intended to replace advice given to you by your health care provider. Make sure you discuss any questions you have with your health care provider.      CHILDBIRTH CLASSES [phone] is the phone number for Pregnancy Classes or hospital tours at [place] will be referred to  [email or web address] for more information on childbirth classes  At this site you may register for classes. You may sign up for a waiting list if classes are full. Please SIGN UP FOR THIS!.   When the waiting list becomes long, sometimes new classes can be added.

## 2017-11-22 NOTE — Progress Notes (Signed)
LOW-RISK PREGNANCY VISIT Patient name: Gina Snow MRN [id number]  Date of birth: [birth year] Chief Complaint:   Routine Prenatal Visit (ultrasound)  History of Present Illness:   Gina Snow is a 27 y.o. G1P0 female at [redacted]w[redacted]d with an Estimated Date of Delivery: 04/24/18 being seen today for ongoing management of a low-risk pregnancy.  Today she reports no complaints. Contractions: Not present.  .  Movement: Present. denies leaking of fluid. Review of Systems:   Pertinent items are noted in HPI Denies abnormal vaginal discharge w/ itching/odor/irritation, headaches, visual changes, shortness of breath, chest pain, abdominal pain, severe nausea/vomiting, or problems with urination or bowel movements unless otherwise stated above.  Pertinent History Reviewed:  Medical & Surgical Hx:   Past Medical History:  Diagnosis Date  . Asthma   . Obesity   . PCO (polycystic ovaries)    Past Surgical History:  Procedure Laterality Date  . NO PAST SURGERIES     Family History  Problem Relation Age of Onset  . Clotting disorder Mother 64       had "brain" clot?  Marland Kitchen Hypertension Maternal Grandmother   . Diabetes Maternal Grandmother   . Thyroid disease Maternal Grandmother   . Hypertension Maternal Grandfather   . Diabetes Paternal Grandmother     Current Outpatient Medications:  .  acetaminophen (TYLENOL) 500 MG tablet, Take 500 mg by mouth every 6 (six) hours as needed for mild pain., Disp: , Rfl:  .  Prenat w/o [id number] (CITRANATAL ASSURE) 35-1 & 300 MG tablet, One tablet and one capsule daily (Patient taking differently: Take 2 tablets by mouth daily. ), Disp: 60 tablet, Rfl: 11 Social History: Reviewed -  reports that she has been smoking cigarettes. She has a 2.00 pack-year smoking history. She has never used smokeless tobacco.  Physical Assessment:   Vitals:   11/22/17 0920  BP: 121/78  Pulse: 93  Weight: 229 lb (103.9 kg)  Body mass index is 39.31  kg/m.        Physical Examination:   General appearance: Well appearing, and in no distress  Mental status: Alert, oriented to person, place, and time  Skin: Warm & dry  Cardiovascular: Normal heart rate noted  Respiratory: Normal respiratory effort, no distress  Abdomen: Soft, gravid, nontender  Pelvic: Cervical exam deferred         Extremities: Edema: None  Fetal Status:     Movement: Present   [place] 18+1 wks,breech,anterior placenta gr 0,normal ovaries bilat,cx 3.4 cm,fhr 129 bpm,svp of fluid 3.5 cm, efw 235 g 56%,limited view of heart because of pt body habitus,please have pt come back for additional image  Results for orders placed or performed in visit on 11/22/17 (from the past 24 hour(s))  POC Urinalysis Dipstick OB   Collection Time: 11/22/17  9:24 AM  Result Value Ref Range   Color, UA     Clarity, UA     Glucose, UA Negative Negative   Bilirubin, UA     Ketones, UA neg    Spec Grav, UA     Blood, UA neg    pH, UA     POC Protein UA Negative Negative, Trace   Urobilinogen, UA     Nitrite, UA neg    Leukocytes, UA Negative Negative   Appearance     Odor      Assessment & Plan:  1) Low-risk pregnancy G1P0 at [redacted]w[redacted]d with an Estimated Date of Delivery: 04/24/18  128  Plan:  Continue routine  obstetrical care    Follow-up: Return in about 3 weeks (around 12/13/2017) for LROB, US:OB F/U:.  Orders Placed This Encounter  Procedures  . US OB Follow Up  . POC Urinalysis Dipstick OB   Christin Fudge CNM 11/22/2017 9:42 AM

## 2017-11-22 NOTE — Progress Notes (Signed)
[place] 18+1 wks,breech,anterior placenta gr 0,normal ovaries bilat,cx 3.4 cm,fhr 129 bpm,svp of fluid 3.5 cm, efw 235 g 56%,limited view of heart because of pt body habitus,please have pt come back for additional images

## 2017-12-13 ENCOUNTER — Ambulatory Visit (INDEPENDENT_AMBULATORY_CARE_PROVIDER_SITE_OTHER): Payer: Medicaid Other

## 2017-12-13 ENCOUNTER — Encounter: Payer: Self-pay | Admitting: Advanced Practice Midwife

## 2017-12-13 ENCOUNTER — Ambulatory Visit (INDEPENDENT_AMBULATORY_CARE_PROVIDER_SITE_OTHER): Payer: Medicaid Other | Admitting: Advanced Practice Midwife

## 2017-12-13 VITALS — BP 117/74 | HR 83 | Wt 231.5 lb

## 2017-12-13 DIAGNOSIS — Z1389 Encounter for screening for other disorder: Secondary | ICD-10-CM

## 2017-12-13 DIAGNOSIS — Z0489 Encounter for examination and observation for other specified reasons: Secondary | ICD-10-CM | POA: Diagnosis not present

## 2017-12-13 DIAGNOSIS — Z3402 Encounter for supervision of normal first pregnancy, second trimester: Secondary | ICD-10-CM

## 2017-12-13 DIAGNOSIS — Z331 Pregnant state, incidental: Secondary | ICD-10-CM

## 2017-12-13 DIAGNOSIS — Z3A21 21 weeks gestation of pregnancy: Secondary | ICD-10-CM

## 2017-12-13 LAB — POCT URINALYSIS DIPSTICK OB
GLUCOSE, UA: NEGATIVE
Ketones, UA: NEGATIVE
Nitrite, UA: NEGATIVE
POC,PROTEIN,UA: NEGATIVE

## 2017-12-13 NOTE — Progress Notes (Signed)
  G1P0 102w1d Estimated Date of Delivery: 04/24/18  Blood pressure 117/74, pulse 83, weight 231 lb 8 oz (105 kg), last menstrual period 08/03/2017.   BP weight and urine results all reviewed and noted.  Please refer to the obstetrical flow sheet for the fundal height and fetal heart rate documentation:  Patient reports good fetal movement, denies any bleeding and no rupture of membranes symptoms or regular contractions. Patient is without complaints. All questions were answered.   Physical Assessment:   Vitals:   12/13/17 1053  BP: 117/74  Pulse: 83  Weight: 231 lb 8 oz (105 kg)  Body mass index is 39.74 kg/m.        Physical Examination:   General appearance: Well appearing, and in no distress  Mental status: Alert, oriented to person, place, and time  Skin: Warm & dry  Cardiovascular: Normal heart rate noted  Respiratory: Normal respiratory effort, no distress  Abdomen: Soft, gravid, nontender  Pelvic: Cervical exam deferred         Extremities: Edema: Trace  Fetal Status: Fetal Heart Rate (bpm): 150us   Movement: Present   [place] 50+0 wks,cephalic,anterior/fundal placenta gr 0,normal ovaries bilat,fhr 150 bpm,cx 3.5 cm,svp of fluid 5.3 cm,EFW 437 g 69%,LVEICF,anatomy of the heart complete  Results for orders placed or performed in visit on 12/13/17 (from the past 24 hour(s))  POC Urinalysis Dipstick OB   Collection Time: 12/13/17 10:54 AM  Result Value Ref Range   Color, UA     Clarity, UA     Glucose, UA Negative Negative   Bilirubin, UA     Ketones, UA neg    Spec Grav, UA     Blood, UA trace    pH, UA     POC Protein UA Negative Negative, Trace   Urobilinogen, UA     Nitrite, UA neg    Leukocytes, UA Trace (A) Negative   Appearance     Odor       Orders Placed This Encounter  Procedures  . POC Urinalysis Dipstick OB    Plan:  Continued routine obstetrical care,   Return in about 3 weeks (around 01/03/2018) for LROB.

## 2017-12-13 NOTE — Progress Notes (Signed)
[place] 81+1 wks,cephalic,anterior/fundal placenta gr 0,normal ovaries bilat,fhr 150 bpm,cx 3.5 cm,svp of fluid 5.3 cm,EFW 437 g 69%,LVEICF,anatomy of the heart complete

## 2017-12-27 ENCOUNTER — Telehealth: Payer: Self-pay | Admitting: Obstetrics & Gynecology

## 2017-12-27 NOTE — Telephone Encounter (Signed)
Patient states she needs a TB skin test for her job but Urgent Care will not do without a letter.  Will print. Patient to pick up.

## 2017-12-27 NOTE — Telephone Encounter (Signed)
Patient called, stated that she went to Urgent Care to get a TB test for work, but they would not give her one because she is pregnant.  She is stating that they need consent from [place] before they will give her the TB test.  Urgent Care here in [place] on [address]  [phone]

## 2018-01-03 ENCOUNTER — Encounter: Payer: Self-pay | Admitting: Obstetrics and Gynecology

## 2018-01-03 ENCOUNTER — Other Ambulatory Visit: Payer: Self-pay

## 2018-01-03 ENCOUNTER — Ambulatory Visit (INDEPENDENT_AMBULATORY_CARE_PROVIDER_SITE_OTHER): Payer: Medicaid Other | Admitting: Obstetrics and Gynecology

## 2018-01-03 VITALS — BP 124/68 | HR 92 | Wt 237.0 lb

## 2018-01-03 DIAGNOSIS — D069 Carcinoma in situ of cervix, unspecified: Secondary | ICD-10-CM

## 2018-01-03 DIAGNOSIS — Z3A24 24 weeks gestation of pregnancy: Secondary | ICD-10-CM

## 2018-01-03 DIAGNOSIS — Z3402 Encounter for supervision of normal first pregnancy, second trimester: Secondary | ICD-10-CM

## 2018-01-03 DIAGNOSIS — Z8742 Personal history of other diseases of the female genital tract: Secondary | ICD-10-CM | POA: Insufficient documentation

## 2018-01-03 DIAGNOSIS — Z658 Other specified problems related to psychosocial circumstances: Secondary | ICD-10-CM

## 2018-01-03 DIAGNOSIS — Z1389 Encounter for screening for other disorder: Secondary | ICD-10-CM

## 2018-01-03 DIAGNOSIS — Z331 Pregnant state, incidental: Secondary | ICD-10-CM

## 2018-01-03 LAB — POCT URINALYSIS DIPSTICK OB
Blood, UA: NEGATIVE
Glucose, UA: NEGATIVE
Ketones, UA: NEGATIVE
Leukocytes, UA: NEGATIVE
Nitrite, UA: NEGATIVE
PROTEIN: NEGATIVE

## 2018-01-03 NOTE — Progress Notes (Signed)
Patient ID: Gina Snow, female   DOB: 1991-01-02, 27 y.o.   MRN: [id number]    LOW-RISK PREGNANCY VISIT Patient name: Gina Snow MRN [id number]  Date of birth: 08-16-1990 Chief Complaint:   Routine Prenatal Visit  History of Present Illness:   Gina Snow is a 27 y.o. G1P0 female at [redacted]w[redacted]d with an Estimated Date of Delivery: 04/24/18 being seen today for ongoing management of a low-risk pregnancy. Had abnormal pap 10/31/17. Never had abnormal pap before pregnancy. Can tell where baby is at in stomach but can't feel much movement  Today she reports no complaints. Contractions: Not present. Vag. Bleeding: None.  Movement: Present. denies leaking of fluid. Review of Systems:   Pertinent items are noted in HPI Denies abnormal vaginal discharge w/ itching/odor/irritation, headaches, visual changes, shortness of breath, chest pain, abdominal pain, severe nausea/vomiting, or problems with urination or bowel movements unless otherwise stated above. Pertinent History Reviewed:  Reviewed past medical,surgical, social, obstetrical and family history.  Reviewed problem list, medications and allergies. Physical Assessment:   Vitals:   01/03/18 1016  BP: 124/68  Pulse: 92  Weight: 237 lb (107.5 kg)  Body mass index is 40.68 kg/m.        Physical Examination:   General appearance: Well appearing, and in no distress  Mental status: Alert, oriented to person, place, and time  Skin: Warm & dry  Cardiovascular: Normal heart rate noted  Respiratory: Normal respiratory effort, no distress  Abdomen: Soft, gravid, nontender  Pelvic: Cervical exam deferred         Extremities: Edema: Trace  Fetal Status:     Movement: Present    Results for orders placed or performed in visit on 01/03/18 (from the past 24 hour(s))  POC Urinalysis Dipstick OB   Collection Time: 01/03/18 10:15 AM  Result Value Ref Range   Color, UA     Clarity, UA     Glucose, UA Negative Negative   Bilirubin, UA      Ketones, UA neg    Spec Grav, UA     Blood, UA neg    pH, UA     POC,PROTEIN,UA Negative Negative, Trace, Small (1+), Moderate (2+), Large (3+), 4+   Urobilinogen, UA     Nitrite, UA neg    Leukocytes, UA Negative Negative   Appearance     Odor      Assessment & Plan:  1) Low-risk pregnancy G1P0 at [redacted]w[redacted]d with an Estimated Date of Delivery: 04/24/18     Meds: No orders of the defined types were placed in this encounter.  Labs/procedures today: NONE  Plan:   1. Recommended kick counts 2. Continue routine obstetrical care 3. F/u in 4 week for routine LROB   Follow-up: Return in about 4 weeks (around 01/31/2018) for [place].  Orders Placed This Encounter  Procedures  . POC Urinalysis Dipstick OB   By signing my name below, I, Samul Dada, attest that this documentation has been prepared under the direction and in the presence of Jonnie Kind, MD. Electronically Signed: [place]. 01/03/18. 10:56 AM.

## 2018-01-24 ENCOUNTER — Encounter (HOSPITAL_COMMUNITY): Payer: Self-pay | Admitting: Emergency Medicine

## 2018-01-24 ENCOUNTER — Emergency Department (HOSPITAL_COMMUNITY)
Admission: EM | Admit: 2018-01-24 | Discharge: 2018-01-24 | Disposition: A | Discharge status: Home / Self Care | Payer: Medicaid Other | Attending: Emergency Medicine | Admitting: Emergency Medicine

## 2018-01-24 ENCOUNTER — Other Ambulatory Visit: Payer: Self-pay

## 2018-01-24 DIAGNOSIS — R030 Elevated blood-pressure reading, without diagnosis of hypertension: Secondary | ICD-10-CM | POA: Insufficient documentation

## 2018-01-24 DIAGNOSIS — J45909 Unspecified asthma, uncomplicated: Secondary | ICD-10-CM | POA: Diagnosis not present

## 2018-01-24 DIAGNOSIS — O9989 Other specified diseases and conditions complicating pregnancy, childbirth and the puerperium: Secondary | ICD-10-CM | POA: Insufficient documentation

## 2018-01-24 DIAGNOSIS — Z87891 Personal history of nicotine dependence: Secondary | ICD-10-CM | POA: Insufficient documentation

## 2018-01-24 DIAGNOSIS — O99513 Diseases of the respiratory system complicating pregnancy, third trimester: Secondary | ICD-10-CM | POA: Insufficient documentation

## 2018-01-24 DIAGNOSIS — O26813 Pregnancy related exhaustion and fatigue, third trimester: Secondary | ICD-10-CM | POA: Diagnosis present

## 2018-01-24 DIAGNOSIS — O163 Unspecified maternal hypertension, third trimester: Secondary | ICD-10-CM

## 2018-01-24 LAB — COMPREHENSIVE METABOLIC PANEL
ALT: 20 U/L (ref 0–44)
AST: 17 U/L (ref 15–41)
Albumin: 3.4 g/dL — ABNORMAL LOW (ref 3.5–5.0)
Alkaline Phosphatase: 62 U/L (ref 38–126)
Anion gap: 7 (ref 5–15)
BUN: 10 mg/dL (ref 6–20)
CALCIUM: 8.8 mg/dL — AB (ref 8.9–10.3)
CO2: 21 mmol/L — AB (ref 22–32)
Chloride: 108 mmol/L (ref 98–111)
Creatinine, Ser: 0.51 mg/dL (ref 0.44–1.00)
GFR calc Af Amer: >60 mL/min (ref >60)
GLUCOSE: 98 mg/dL (ref 70–99)
Potassium: 3.5 mmol/L (ref 3.5–5.1)
Sodium: 136 mmol/L (ref 135–145)
Total Bilirubin: 0.4 mg/dL (ref 0.3–1.2)
Total Protein: 6.7 g/dL (ref 6.5–8.1)

## 2018-01-24 LAB — CBC WITH DIFFERENTIAL/PLATELET
Abs Immature Granulocytes: 0.21 10*3/uL — ABNORMAL HIGH (ref 0.00–0.07)
BASOS ABS: 0 10*3/uL (ref 0.0–0.1)
Basophils Relative: 0 %
Eosinophils Absolute: 0.1 10*3/uL (ref 0.0–0.5)
Eosinophils Relative: 1 %
HCT: 35.2 % — ABNORMAL LOW (ref 36.0–46.0)
Hemoglobin: 11.3 g/dL — ABNORMAL LOW (ref 12.0–15.0)
Immature Granulocytes: 2 %
Lymphocytes Relative: 16 %
Lymphs Abs: 2.3 10*3/uL (ref 0.7–4.0)
MCH: 31.9 pg (ref 26.0–34.0)
MCHC: 32.1 g/dL (ref 30.0–36.0)
MCV: 99.4 fL (ref 80.0–100.0)
MONO ABS: 1.1 10*3/uL — AB (ref 0.1–1.0)
Monocytes Relative: 8 %
Neutro Abs: 10.4 10*3/uL — ABNORMAL HIGH (ref 1.7–7.7)
Neutrophils Relative %: 73 %
Platelets: 218 10*3/uL (ref 150–400)
RBC: 3.54 MIL/uL — ABNORMAL LOW (ref 3.87–5.11)
RDW: 13.6 % (ref 11.5–15.5)
WBC: 14.2 10*3/uL — ABNORMAL HIGH (ref 4.0–10.5)
nRBC: 0 % (ref 0.0–0.2)

## 2018-01-24 LAB — URINALYSIS, ROUTINE W REFLEX MICROSCOPIC
Bilirubin Urine: NEGATIVE
Glucose, UA: NEGATIVE mg/dL
Hgb urine dipstick: NEGATIVE
Ketones, ur: NEGATIVE mg/dL
Nitrite: NEGATIVE
Protein, ur: NEGATIVE mg/dL
Specific Gravity, Urine: 1.015 (ref 1.005–1.030)
pH: 7 (ref 5.0–8.0)

## 2018-01-24 LAB — URINALYSIS, MICROSCOPIC (REFLEX): RBC / HPF: NONE SEEN RBC/hpf (ref 0–5)

## 2018-01-24 NOTE — ED Notes (Signed)
Pt ambulatory to waiting room. Pt verbalized understanding of discharge instructions.   

## 2018-01-24 NOTE — ED Provider Notes (Signed)
[facility] EMERGENCY DEPARTMENT Provider Note   CSN: [id number] Arrival date & time: 01/24/18  2032     History   Chief Complaint Chief Complaint  Patient presents with  . Fatigue    HPI Gina Snow is a 27 y.o. female.  Gina Snow is a 27 year old female who is 7 months pregnant.  She has a past medical history of prediabetes, smoking, asthma, PCOS.  She presents for 3 days of chronic fatigue. This is worse when at work lifting boxes. She endorses shortness of breath.  She denies any headache, lower extremity edema, chest pain, vaginal bleeding, vaginal leakage of fluid, contractions.  Patient says the baby is moving around appropriately.  Denies any diagnosis of hypertension during this pregnancy.  Patient's OB GYN is Dr[facility] Glo Her[facility]      Past Medical History:  Diagnosis Date  . Asthma   . Obesity   . PCO (polycystic ovaries)     Patient Active Problem List   Diagnosis Date Noted  . Inadequate social support FOB died 12-02-2017 Jan 17, 2018  . CIN III (cervical intraepithelial neoplasia grade III) with severe dysplasia 01/17/2018  . Gonorrhea affecting pregnancy in first trimester 10/05/2017  . Supervision of normal first pregnancy 10/03/2017  . Glucose intolerance (impaired glucose tolerance) 12/21/2012  . PCOS (polycystic ovarian syndrome) 12/21/2012  . Prediabetes 10/26/2012  . Hyperglycemia 09/24/2012  . Tobacco use 09/17/2012    Past Surgical History:  Procedure Laterality Date  . NO PAST SURGERIES       OB History    Gravida  1   Para      Term      Preterm      AB      Living        SAB      TAB      Ectopic      Multiple      Live Births               Home Medications    Prior to Admission medications   Medication Sig Start Date End Date Taking? Authorizing Provider  acetaminophen (TYLENOL) 500 MG tablet Take 500 mg by mouth every 6 (six) hours as needed for mild pain.   Yes [provider]    Prenat w/o A-FeCbGl-DSS-FA-DHA (CITRANATAL ASSURE) 35-1 & 300 MG tablet One tablet and one capsule daily Patient taking differently: Take 2 tablets by mouth daily.  10/03/17  Yes Roma Schanz, CNM    Family History Family History  Problem Relation Age of Onset  . Clotting disorder Mother 44       had "brain" clot?  Marland Kitchen Hypertension Maternal Grandmother   . Diabetes Maternal Grandmother   . Thyroid disease Maternal Grandmother   . Hypertension Maternal Grandfather   . Diabetes Paternal Grandmother     Social History Social History   Tobacco Use  . Smoking status: Former Smoker    Packs/day: 0.25    Years: 8.00    Pack years: 2.00    Types: Cigarettes  . Smokeless tobacco: Never Used  . Tobacco comment: 2-3 per day  Substance Use Topics  . Alcohol use: Not Currently    Comment: every once in a while  . Drug use: No     Allergies   Patient has no known allergies.   Review of Systems Review of Systems  Constitutional: Negative for appetite change.  Respiratory: Positive for shortness of breath. Negative for cough, chest tightness and  wheezing.   Cardiovascular: Negative for chest pain and leg swelling.  Gastrointestinal: Negative for abdominal pain, blood in stool, diarrhea and vomiting.  Genitourinary: Negative for menstrual problem, vaginal bleeding, vaginal discharge and vaginal pain.  Neurological: Negative for dizziness and weakness.     Physical Exam Updated Vital Signs BP 121/62   Pulse 92   Temp 98 F (36.7 C)   Resp 18   Ht 5\' 4"  (1.626 m)   Wt 105.7 kg   LMP 08/03/2017   SpO2 100%   BMI 39.99 kg/m   General appearance: alert and cooperative Head: Normocephalic, without obvious abnormality, atraumatic Lungs: clear to auscultation bilaterally and NWOB Heart: regular rate and rhythm, S1, S2 normal, no murmur, click, rub or gallop Abdomen: distended for pregnancy, nontender Extremities: extremities normal, atraumatic, no cyanosis or  edema Skin: Skin color, texture, turgor normal. No rashes or lesions Neurologic: Alert and oriented X 3, normal strength and tone. Normal symmetric reflexes. Normal coordination and gait    ED Treatments / Results  Labs (all labs ordered are listed, but only abnormal results are displayed) Labs Reviewed  CBC WITH DIFFERENTIAL/PLATELET - Abnormal; Notable for the following components:      Result Value   WBC 14.2 (*)    RBC 3.54 (*)    Hemoglobin 11.3 (*)    HCT 35.2 (*)    Neutro Abs 10.4 (*)    Monocytes Absolute 1.1 (*)    Abs Immature Granulocytes 0.21 (*)    All other components within normal limits  COMPREHENSIVE METABOLIC PANEL - Abnormal; Notable for the following components:   CO2 21 (*)    Calcium 8.8 (*)    Albumin 3.4 (*)    All other components within normal limits  URINALYSIS, ROUTINE W REFLEX MICROSCOPIC - Abnormal; Notable for the following components:   APPearance CLOUDY (*)    Leukocytes, UA TRACE (*)    All other components within normal limits  URINALYSIS, MICROSCOPIC (REFLEX) - Abnormal; Notable for the following components:   Bacteria, UA FEW (*)    All other components within normal limits    EKG None  Radiology No results found.  Procedures Procedures (including critical care time)  Medications Ordered in ED Medications - No data to display   Initial Impression / Assessment and Plan / ED Course  I have reviewed the triage vital signs and the nursing notes.  Pertinent labs & imaging results that were available during my care of the patient were reviewed by me and considered in my medical decision making (see chart for details).     27 year old female and a 7 months pregnant presents to ED for fatigue also newly found elevated blood pressure.  No signs of preeclampsia. Not having regular contractions. Sanger labs wnl. Repeat BP was significantly decreased. -  Recommend follow up with OB/GYN for BP check  Final Clinical Impressions(s) / ED  Diagnoses   Final diagnoses:  Elevated blood pressure affecting pregnancy in third trimester, antepartum    ED Discharge Orders    None      Bonnita Hollow, MD 01/24/18 [id number]  Isla Pence, MD 01/24/18 2332

## 2018-01-24 NOTE — Discharge Instructions (Signed)
Schedule an appointment as soon as possible for a visit in 2 days. Why: BP check

## 2018-01-24 NOTE — ED Triage Notes (Signed)
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 Pt states at times she is dizzy. Pt states she is 7 months pregnant.

## 2018-01-29 ENCOUNTER — Ambulatory Visit (INDEPENDENT_AMBULATORY_CARE_PROVIDER_SITE_OTHER): Payer: Medicaid Other

## 2018-01-29 VITALS — BP 124/69 | HR 96 | Ht 64.0 in | Wt 240.8 lb

## 2018-01-29 DIAGNOSIS — Z0130 Encounter for examination of blood pressure without abnormal findings: Secondary | ICD-10-CM

## 2018-01-29 DIAGNOSIS — Z331 Pregnant state, incidental: Secondary | ICD-10-CM

## 2018-01-29 DIAGNOSIS — Z1389 Encounter for screening for other disorder: Secondary | ICD-10-CM

## 2018-01-29 LAB — POCT URINALYSIS DIPSTICK OB
Blood, UA: NEGATIVE
Glucose, UA: NEGATIVE
Ketones, UA: NEGATIVE
LEUKOCYTES UA: NEGATIVE
Nitrite, UA: NEGATIVE

## 2018-01-29 NOTE — Progress Notes (Signed)
Pt here for blood pressure check.124/69 pulse 96. Spoke Gina Snow about result. Blood pressure is okay. Keep appointment 01-31-18. Pad CMA

## 2018-01-31 ENCOUNTER — Ambulatory Visit (INDEPENDENT_AMBULATORY_CARE_PROVIDER_SITE_OTHER): Payer: Medicaid Other | Admitting: Advanced Practice Midwife

## 2018-01-31 ENCOUNTER — Other Ambulatory Visit: Payer: Self-pay

## 2018-01-31 VITALS — BP 128/74 | HR 98 | Wt 240.0 lb

## 2018-01-31 DIAGNOSIS — Z331 Pregnant state, incidental: Secondary | ICD-10-CM

## 2018-01-31 DIAGNOSIS — Z23 Encounter for immunization: Secondary | ICD-10-CM

## 2018-01-31 DIAGNOSIS — Z131 Encounter for screening for diabetes mellitus: Secondary | ICD-10-CM

## 2018-01-31 DIAGNOSIS — Z3A28 28 weeks gestation of pregnancy: Secondary | ICD-10-CM

## 2018-01-31 DIAGNOSIS — Z1389 Encounter for screening for other disorder: Secondary | ICD-10-CM

## 2018-01-31 DIAGNOSIS — Z3403 Encounter for supervision of normal first pregnancy, third trimester: Secondary | ICD-10-CM

## 2018-01-31 LAB — POCT URINALYSIS DIPSTICK OB
Blood, UA: NEGATIVE
Glucose, UA: NEGATIVE
Ketones, UA: NEGATIVE
Leukocytes, UA: NEGATIVE
Nitrite, UA: NEGATIVE
POC,PROTEIN,UA: NEGATIVE

## 2018-01-31 NOTE — Patient Instructions (Signed)

## 2018-01-31 NOTE — Progress Notes (Addendum)
  G1P0 [redacted]w[redacted]d Estimated Date of Delivery: 04/24/18  Blood pressure 128/74, pulse 98, weight 240 lb (108.9 kg), last menstrual period 08/03/2017.   BP weight and urine results all reviewed and noted.  Please refer to the obstetrical flow sheet for the fundal height and fetal heart rate documentation:  Patient reports good fetal movement, denies any bleeding and no rupture of membranes symptoms or regular contractions. Patient is without complaints. All questions were answered.   Physical Assessment:   Vitals:   01/31/18 0844  BP: 128/74  Pulse: 98  Weight: 240 lb (108.9 kg)  Body mass index is 41.2 kg/m.        Physical Examination:   General appearance: Well appearing, and in no distress  Mental status: Alert, oriented to person, place, and time  Skin: Warm & dry  Cardiovascular: Normal heart rate noted  Respiratory: Normal respiratory effort, no distress  Abdomen: Soft, gravid, nontender  Pelvic: Cervical exam deferred         Extremities: Edema: Trace  Fetal Status: Fetal Heart Rate (bpm): 140 Fundal Height: 30 cm Movement: Present    Results for orders placed or performed in visit on 01/31/18 (from the past 24 hour(s))  POC Urinalysis Dipstick OB   Collection Time: 01/31/18  8:46 AM  Result Value Ref Range   Color, UA     Clarity, UA     Glucose, UA Negative Negative   Bilirubin, UA     Ketones, UA neg    Spec Grav, UA     Blood, UA neg    pH, UA     POC,PROTEIN,UA Negative Negative, Trace, Small (1+), Moderate (2+), Large (3+), 4+   Urobilinogen, UA     Nitrite, UA neg    Leukocytes, UA Negative Negative   Appearance     Odor       Orders Placed This Encounter  Procedures  . Tdap vaccine greater than or equal to 7yo IM  . CBC  . Glucose Tolerance, 2 Hours w/1 Hour  . HIV Antibody (routine testing w rflx)  . RPR  . POC Urinalysis Dipstick OB  . Antibody screen    Plan:  Continued routine obstetrical care, ate this am so can't go gtt  Return for  tomorrow for PN2 only and 3 weeks for LROB.

## 2018-02-01 ENCOUNTER — Other Ambulatory Visit: Payer: Medicaid Other

## 2018-02-01 DIAGNOSIS — Z3403 Encounter for supervision of normal first pregnancy, third trimester: Secondary | ICD-10-CM

## 2018-02-05 LAB — CBC
Hematocrit: 35 % (ref 34.0–46.6)
Hemoglobin: 11.7 g/dL (ref 11.1–15.9)
MCH: 32.3 pg (ref 26.6–33.0)
MCHC: 33.4 g/dL (ref 31.5–35.7)
MCV: 97 fL (ref 79–97)
Platelets: 217 10*3/uL (ref 150–450)
RBC: 3.62 x10E6/uL — AB (ref 3.77–5.28)
RDW: 12.8 % (ref 12.3–15.4)
WBC: 17.4 10*3/uL — ABNORMAL HIGH (ref 3.4–10.8)

## 2018-02-05 LAB — AB SCR+ANTIBODY ID

## 2018-02-05 LAB — GLUCOSE TOLERANCE, 2 HOURS W/ 1HR
GLUCOSE, 2 HOUR: 116 mg/dL (ref 65–152)
Glucose, 1 hour: 164 mg/dL (ref 65–179)
Glucose, Fasting: 90 mg/dL (ref 65–91)

## 2018-02-05 LAB — RPR: RPR Ser Ql: NONREACTIVE

## 2018-02-05 LAB — ANTIBODY SCREEN

## 2018-02-05 LAB — HIV ANTIBODY (ROUTINE TESTING W REFLEX): HIV Screen 4th Generation wRfx: NONREACTIVE

## 2018-02-06 ENCOUNTER — Other Ambulatory Visit: Payer: Self-pay

## 2018-02-06 ENCOUNTER — Emergency Department (HOSPITAL_COMMUNITY)
Admission: EM | Admit: 2018-02-06 | Discharge: 2018-02-06 | Disposition: A | Discharge status: Home / Self Care | Payer: Medicaid Other | Attending: Emergency Medicine | Admitting: Emergency Medicine

## 2018-02-06 ENCOUNTER — Encounter (HOSPITAL_COMMUNITY): Payer: Self-pay | Admitting: Emergency Medicine

## 2018-02-06 DIAGNOSIS — K029 Dental caries, unspecified: Secondary | ICD-10-CM | POA: Insufficient documentation

## 2018-02-06 DIAGNOSIS — O26893 Other specified pregnancy related conditions, third trimester: Secondary | ICD-10-CM | POA: Diagnosis present

## 2018-02-06 NOTE — ED Triage Notes (Signed)
Patient reports dental pain that started 3 nights ago.

## 2018-02-06 NOTE — ED Provider Notes (Signed)
[facility] EMERGENCY DEPARTMENT Provider Note   CSN: [id number] Arrival date & time: 02/06/18  2139   History   Chief Complaint Chief Complaint  Patient presents with  . Dental Pain    HPI Gina Snow is a 27 y.o. female with past medical history significant for asthma, obesity who presents for evaluation of dental pain.  Patient states she noticed right-sided tooth pain over the last 3 days.  Pain is worse when she drinks cold or hot liquids.  Patient states she has used Orajel with mild relief of symptoms.  She has not taken p.o. medication for her symptoms.  She is able to tolerate p.o. intake without difficulty.  Patient denies fever, chills, facial swelling, difficulty tolerating p.o. intake, nausea, vomiting, neck pain or neck stiffness.  Patient states she did recently have a crown fall off of her tooth.  Patient states she does have a history of dental cavities.  Patient does not follow with dentistry.  Rates her pain a 6/10.  Denies radiation of pain.  Denies discharge from her gums or bleeding from her gums.  She is approximately 8 months pregnant. Denies abdominal pain or vaginal bleeding.  History obtained from patient.  No interpreter was used.  HPI  Past Medical History:  Diagnosis Date  . Asthma   . Obesity   . PCO (polycystic ovaries)     Patient Active Problem List   Diagnosis Date Noted  . Inadequate social support FOB died 2017/12/18 02/02/2018  . CIN III (cervical intraepithelial neoplasia grade III) with severe dysplasia 02-02-2018  . Gonorrhea affecting pregnancy in first trimester 10/05/2017  . Supervision of normal first pregnancy 10/03/2017  . Glucose intolerance (impaired glucose tolerance) 12/21/2012  . PCOS (polycystic ovarian syndrome) 12/21/2012  . Prediabetes 10/26/2012  . Hyperglycemia 09/24/2012  . Tobacco use 09/17/2012    Past Surgical History:  Procedure Laterality Date  . NO PAST SURGERIES       OB History    Gravida  1   Para       Term      Preterm      AB      Living        SAB      TAB      Ectopic      Multiple      Live Births               Home Medications    Prior to Admission medications   Medication Sig Start Date End Date Taking? Authorizing Provider  acetaminophen (TYLENOL) 500 MG tablet Take 500 mg by mouth every 6 (six) hours as needed for mild pain.    [provider]  Prenat w/o A-FeCbGl-DSS-FA-DHA (CITRANATAL ASSURE) 35-1 & 300 MG tablet One tablet and one capsule daily Patient taking differently: Take 2 tablets by mouth daily.  10/03/17   Roma Schanz, CNM    Family History Family History  Problem Relation Age of Onset  . Clotting disorder Mother 30       had "brain" clot?  Marland Kitchen Hypertension Maternal Grandmother   . Diabetes Maternal Grandmother   . Thyroid disease Maternal Grandmother   . Hypertension Maternal Grandfather   . Diabetes Paternal Grandmother     Social History Social History   Tobacco Use  . Smoking status: Former Smoker    Packs/day: 0.25    Years: 8.00    Pack years: 2.00    Types: Cigarettes  . Smokeless tobacco: Never  Used  . Tobacco comment: 2-3 per day  Substance Use Topics  . Alcohol use: Not Currently    Comment: every once in a while  . Drug use: No     Allergies   Patient has no known allergies.   Review of Systems Review of Systems  Constitutional: Negative.   HENT: Positive for dental problem. Negative for congestion, drooling, ear discharge, ear pain, facial swelling, hearing loss, mouth sores, nosebleeds, postnasal drip, rhinorrhea, sinus pressure, sinus pain, sneezing, sore throat, tinnitus, trouble swallowing and voice change.   Respiratory: Negative.   Cardiovascular: Negative.   Gastrointestinal: Negative.   Genitourinary: Negative.   Musculoskeletal: Negative.   Skin: Negative.   Neurological: Negative.   All other systems reviewed and are negative.    Physical Exam Updated Vital Signs BP  127/61 (BP Location: Right Arm)   Pulse 85   Temp 98.8 F (37.1 C) (Oral)   Resp (!) 22   Ht 5\' 4"  (1.626 m)   Wt 108.9 kg   LMP 08/03/2017   SpO2 100%   BMI 41.20 kg/m   Physical Exam Vitals signs and nursing note reviewed.  Constitutional:      General: She is not in acute distress.    Appearance: She is well-developed. She is not ill-appearing, toxic-appearing or diaphoretic.     Comments: Patient talking on cell phone upon initial evaluation.  She appears in no acute distress.  HENT:     Head: Normocephalic and atraumatic.     Jaw: There is normal jaw occlusion. No trismus, tenderness, swelling, pain on movement or malocclusion.     Comments: No submandibular swelling.    Right Ear: Tympanic membrane, ear canal and external ear normal. No drainage, swelling or tenderness. Tympanic membrane is not perforated, erythematous, retracted or bulging.     Left Ear: Tympanic membrane, ear canal and external ear normal. No drainage, swelling or tenderness. Tympanic membrane is not perforated, erythematous, retracted or bulging.     Nose: Nose normal.     Right Sinus: No maxillary sinus tenderness or frontal sinus tenderness.     Left Sinus: No maxillary sinus tenderness or frontal sinus tenderness.     Mouth/Throat:     Lips: Pink. No lesions.     Mouth: Mucous membranes are moist.     Dentition: Abnormal dentition. Does not have dentures. Dental tenderness present. No gingival swelling, dental abscesses or gum lesions.      Comments: No facial swelling.  Uvula midline without deviation.  Gingiva without erythema or edema.  No evidence of periapical abscess.  Posterior oropharynx without erythema, edema or exudate.  Tonsils without edema or exudate.  Patient with missing crown over tooth number #30.  No exposed pulp.  No evidence of oropharyngeal infection.  Patient does have multiple dental caries. Eyes:     Pupils: Pupils are equal, round, and reactive to light.  Neck:      Musculoskeletal: Normal range of motion.  Cardiovascular:     Rate and Rhythm: Normal rate.  Pulmonary:     Effort: No respiratory distress.  Abdominal:     General: There is no distension.  Musculoskeletal: Normal range of motion.  Skin:    General: Skin is warm and dry.  Neurological:     Mental Status: She is alert.      ED Treatments / Results  Labs (all labs ordered are listed, but only abnormal results are displayed) Labs Reviewed - No data to display  EKG None  Radiology No results found.  Procedures Procedures (including critical care time)  Medications Ordered in ED Medications - No data to display   Initial Impression / Assessment and Plan / ED Course  I have reviewed the triage vital signs and the nursing notes.  Pertinent labs & imaging results that were available during my care of the patient were reviewed by me and considered in my medical decision making (see chart for details).  27 year old female who appears otherwise well presents for evaluation of dental pain.  Patient with multiple dental caries.  She does have a missing crown over tooth #30.  No exposed pulp.  No evidence of facial swelling.  No submandibular swelling.  Gingiva without erythema or edema.  No trismus.  Phonation normal.  No drooling.  No evidence of periapical abscess. Exam unconcerning for Ludwig's angina or spread of infection.  Unable to take anti-inflammatories as she is currently 8 months pregnant.  Discussed use of Tylenol for pain relief.  Patient hesitant to start antibiotics at this time.  Discussed follow-up with dentistry.  I have provided her with resources for this.  Patient is hemodynamically stable and appropriate for DC home at this time.  She has no abdominal pain or vaginal bleeding.  Discussed follow-up with her OB/GYN for her regularly scheduled prenatal visit.  Discussed follow-up with PCP for reevaluation.  Discussed strict return precautions.  Patient voiced  understanding and is agreeable for follow-up.    Final Clinical Impressions(s) / ED Diagnoses   Final diagnoses:  Pain due to dental caries    ED Discharge Orders    None       Dana Dorner A, PA-C 02/06/18 2235    Milton Ferguson, MD 02/08/18 1501

## 2018-02-06 NOTE — ED Notes (Addendum)
Pt very upset and cussing at this RN while going over discharge paper work stating "this is some bullshit, there has to be something stronger than tylenol they can give me." Pt still hadn't left room a few minutes later, so this RN went back into room to see if pt needed anything else or had any questions. Pt stated she was "googling what pain medication she could have while pregnant, that it was bullshit again, and that she was pissed off about her care." this RN went over with pt that the PA did not order anything stronger and that she was going to hurt until she was seen by a dentist about the tooth that was bothering her. Pt left a few minutes later on the phone and still upset.

## 2018-02-06 NOTE — Discharge Instructions (Signed)
You were evaluated today for dental pain.  You do not have evidence of dental infection.  You may take Tylenol as needed for your pain as you are pregnant.  I have given you resources for dentistry.  Please follow-up with them.  Return to the ED for any new or worsening symptoms

## 2018-02-12 ENCOUNTER — Encounter (HOSPITAL_COMMUNITY): Payer: Self-pay

## 2018-02-12 ENCOUNTER — Other Ambulatory Visit: Payer: Self-pay

## 2018-02-12 ENCOUNTER — Emergency Department (HOSPITAL_COMMUNITY)
Admission: EM | Admit: 2018-02-12 | Discharge: 2018-02-12 | Disposition: A | Discharge status: Home / Self Care | Payer: Medicaid Other | Attending: Emergency Medicine | Admitting: Emergency Medicine

## 2018-02-12 DIAGNOSIS — R05 Cough: Secondary | ICD-10-CM | POA: Diagnosis present

## 2018-02-12 DIAGNOSIS — Z79899 Other long term (current) drug therapy: Secondary | ICD-10-CM | POA: Insufficient documentation

## 2018-02-12 DIAGNOSIS — J069 Acute upper respiratory infection, unspecified: Secondary | ICD-10-CM | POA: Insufficient documentation

## 2018-02-12 DIAGNOSIS — B9789 Other viral agents as the cause of diseases classified elsewhere: Secondary | ICD-10-CM

## 2018-02-12 DIAGNOSIS — J45909 Unspecified asthma, uncomplicated: Secondary | ICD-10-CM | POA: Diagnosis not present

## 2018-02-12 DIAGNOSIS — Z87891 Personal history of nicotine dependence: Secondary | ICD-10-CM | POA: Insufficient documentation

## 2018-02-12 MED ORDER — ALBUTEROL SULFATE HFA 108 (90 BASE) MCG/ACT IN AERS: 1.0000 | INHALATION_SPRAY | Freq: Four times a day (QID) | RESPIRATORY_TRACT | 0 refills | Status: AC | PRN

## 2018-02-12 NOTE — ED Provider Notes (Signed)
[facility] EMERGENCY DEPARTMENT Provider Note   CSN: [id number] Arrival date & time: 02/12/18  1319     History   Chief Complaint Chief Complaint  Patient presents with  . Cough    HPI Gina Snow is a 27 y.o. female.  27 year old pregnant female presents with complaint of congestion and nonproductive cough.  Denies fevers, chills, body aches, sore throat.  Patient states that she feels very hot at night but has not checked her temperature.  Patient was exposed to a friend who has had cough and cold symptoms as well.  Patient denies any pregnancy related complaints, reports normal fetal activity, has routine prenatal care with her next appointment in the next week.  Patient has not taken anything for her symptoms.  No other complaints or concerns.     Past Medical History:  Diagnosis Date  . Asthma   . Obesity   . PCO (polycystic ovaries)     Patient Active Problem List   Diagnosis Date Noted  . Inadequate social support FOB died 12/10/2017 January 25, 2018  . CIN III (cervical intraepithelial neoplasia grade III) with severe dysplasia Jan 25, 2018  . Gonorrhea affecting pregnancy in first trimester 10/05/2017  . Supervision of normal first pregnancy 10/03/2017  . Glucose intolerance (impaired glucose tolerance) 12/21/2012  . PCOS (polycystic ovarian syndrome) 12/21/2012  . Prediabetes 10/26/2012  . Hyperglycemia 09/24/2012  . Tobacco use 09/17/2012    Past Surgical History:  Procedure Laterality Date  . NO PAST SURGERIES       OB History    Gravida  1   Para      Term      Preterm      AB      Living        SAB      TAB      Ectopic      Multiple      Live Births               Home Medications    Prior to Admission medications   Medication Sig Start Date End Date Taking? Authorizing Provider  acetaminophen (TYLENOL) 500 MG tablet Take 500 mg by mouth every 6 (six) hours as needed for mild pain.    [provider]  albuterol  (PROVENTIL HFA;VENTOLIN HFA) 108 (90 Base) MCG/ACT inhaler Inhale 1-2 puffs into the lungs every 6 (six) hours as needed for wheezing or shortness of breath. 02/12/18   Tacy Learn, PA-C  Prenat w/o A-FeCbGl-DSS-FA-DHA (CITRANATAL ASSURE) 35-1 & 300 MG tablet One tablet and one capsule daily Patient taking differently: Take 2 tablets by mouth daily.  10/03/17   Roma Schanz, CNM    Family History Family History  Problem Relation Age of Onset  . Clotting disorder Mother 62       had "brain" clot?  Marland Kitchen Hypertension Maternal Grandmother   . Diabetes Maternal Grandmother   . Thyroid disease Maternal Grandmother   . Hypertension Maternal Grandfather   . Diabetes Paternal Grandmother     Social History Social History   Tobacco Use  . Smoking status: Former Smoker    Packs/day: 0.25    Years: 8.00    Pack years: 2.00    Types: Cigarettes  . Smokeless tobacco: Never Used  . Tobacco comment: 2-3 per day  Substance Use Topics  . Alcohol use: Not Currently    Comment: every once in a while  . Drug use: No     Allergies  Patient has no known allergies.   Review of Systems Review of Systems  Constitutional: Positive for diaphoresis. Negative for chills and fever.  HENT: Positive for congestion. Negative for ear pain, rhinorrhea, sinus pressure, sinus pain and sore throat.   Respiratory: Positive for cough.   Gastrointestinal: Negative for abdominal pain.  Musculoskeletal: Negative for arthralgias and myalgias.  Skin: Negative for rash.  Allergic/Immunologic: Negative for immunocompromised state.  Neurological: Negative for dizziness, weakness and headaches.  Hematological: Negative for adenopathy.  Psychiatric/Behavioral: Negative for confusion.  All other systems reviewed and are negative.    Physical Exam Updated Vital Signs BP 117/66 (BP Location: Right Arm)   Pulse (!) 101   Temp 98.2 F (36.8 C) (Oral)   Resp 20   Ht 5\' 4"  (1.626 m)   Wt 108.9 kg   LMP  08/03/2017   SpO2 98%   BMI 41.20 kg/m   Physical Exam Vitals signs and nursing note reviewed.  Constitutional:      General: She is not in acute distress.    Appearance: Normal appearance. She is well-developed. She is not diaphoretic.  HENT:     Head: Normocephalic and atraumatic.     Right Ear: Tympanic membrane and ear canal normal.     Left Ear: Tympanic membrane and ear canal normal.     Nose: Congestion present. No rhinorrhea.     Mouth/Throat:     Pharynx: Oropharynx is clear. Uvula midline. Posterior oropharyngeal erythema present. No oropharyngeal exudate or uvula swelling.  Eyes:     Conjunctiva/sclera: Conjunctivae normal.  Neck:     Musculoskeletal: Neck supple.  Cardiovascular:     Rate and Rhythm: Normal rate and regular rhythm.     Pulses: Normal pulses.     Heart sounds: Normal heart sounds. No murmur.  Pulmonary:     Effort: Pulmonary effort is normal.     Breath sounds: Normal breath sounds.  Lymphadenopathy:     Cervical: No cervical adenopathy.  Skin:    General: Skin is warm and dry.     Findings: No rash.  Neurological:     Mental Status: She is alert and oriented to person, place, and time.  Psychiatric:        Behavior: Behavior normal.      ED Treatments / Results  Labs (all labs ordered are listed, but only abnormal results are displayed) Labs Reviewed - No data to display  EKG None  Radiology No results found.  Procedures Procedures (including critical care time)  Medications Ordered in ED Medications - No data to display   Initial Impression / Assessment and Plan / ED Course  I have reviewed the triage vital signs and the nursing notes.  Pertinent labs & imaging results that were available during my care of the patient were reviewed by me and considered in my medical decision making (see chart for details).  Clinical Course as of Feb 12 1554  Mon Dec 30, [id number]  [id number] 27 year old well-appearing pregnant female with complaint  of cough and congestion.  Lung sounds are clear, patient has postnasal drip likely causing her cough.  Patient has a history of asthma, states that she has not needed her inhaler with this illness however would like her inhaler refilled.  Refill sent to her pharmacy.  Discussed symptomatic relief and medications generally safe in pregnancy although advised to discuss with her OB and pharmacist.  Return to ER for any worsening or concerning symptoms.   [LM]    Clinical  Course User Index [LM] Tacy Learn, PA-C   Final Clinical Impressions(s) / ED Diagnoses   Final diagnoses:  Viral URI with cough    ED Discharge Orders         Ordered    albuterol (PROVENTIL HFA;VENTOLIN HFA) 108 (90 Base) MCG/ACT inhaler  Every 6 hours PRN     02/12/18 1517           Tacy Learn, PA-C 02/12/18 1556    Dorie Rank, MD 02/13/18 [phone]

## 2018-02-12 NOTE — ED Triage Notes (Signed)
Pt c/o coughing, stopped up nose, chest pain with breathing and coughing x 3 days.  Pt says she has felt hot.  Reports is [redacted] weeks pregnant.

## 2018-02-12 NOTE — Discharge Instructions (Addendum)
Take Tylenol as needed as directed.  Use a saline sinus rinse twice daily.  Take Zyrtec daily.  Take Robitussin-DM as directed, if you have concerns about other cold medications always check with your pharmacist or OB.

## 2018-02-14 NOTE — L&D Delivery Note (Signed)
Delivery Note G[facility] is a 28 y.o. G1P0 at [redacted]w[redacted]d admitted for IOL d/t PPROM on 1/23.  Labor course: cytotec, foley bulb, pitocin ROM: 130h 45m with clear fluid  At 0717 a viable female was delivered via spontaneous vaginal delivery (Presentation: ROA) with loose shoulder/body cord- reduced immediately at birth.  Vigorous infant placed directly on mom's abdomen for bonding/skin-to-skin. Delayed cord clamping x 59min, then cord milked towards infant d/t prematurity- then cord clamped x 2, and cut by pt's father.  APGAR: refer to delivery summary ; weight: pending at time of note.  40 units of pitocin diluted in 1000cc LR was infused rapidly IV per protocol. The placenta separated spontaneously and delivered via CCT and maternal pushing effort.  It was inspected and appears to be intact with a 3 VC.  Placenta/Cord with the following complications: none .  Cord pH: not done  Intrapartum complications:  None Anesthesia:  epidural Episiotomy: none Lacerations:  Few superficial hemostatic lacs, no repair needed Suture Repair: n/a Est. Blood Loss (mL): 74ml Sponge and instrument count were correct x2.  Mom to postpartum.  Baby to NICU. Placenta to path. Plans to breast & bottlefeed Contraception: nexplanon Circ: n/a  [place], WHNP-BC 03/14/2018 7:44 AM

## 2018-02-21 ENCOUNTER — Encounter: Payer: Medicaid Other | Admitting: Obstetrics and Gynecology

## 2018-02-28 ENCOUNTER — Encounter: Payer: Self-pay | Admitting: Obstetrics and Gynecology

## 2018-02-28 ENCOUNTER — Emergency Department (HOSPITAL_COMMUNITY)
Admission: EM | Admit: 2018-02-28 | Discharge: 2018-02-28 | Disposition: A | Discharge status: Home / Self Care | Payer: Medicaid Other | Attending: Emergency Medicine | Admitting: Emergency Medicine

## 2018-02-28 ENCOUNTER — Ambulatory Visit (INDEPENDENT_AMBULATORY_CARE_PROVIDER_SITE_OTHER): Payer: Medicaid Other | Admitting: Obstetrics and Gynecology

## 2018-02-28 ENCOUNTER — Encounter (HOSPITAL_COMMUNITY): Payer: Self-pay | Admitting: Emergency Medicine

## 2018-02-28 ENCOUNTER — Other Ambulatory Visit: Payer: Self-pay

## 2018-02-28 VITALS — BP 134/72 | HR 84 | Wt 246.0 lb

## 2018-02-28 DIAGNOSIS — Z3A32 32 weeks gestation of pregnancy: Secondary | ICD-10-CM

## 2018-02-28 DIAGNOSIS — N390 Urinary tract infection, site not specified: Secondary | ICD-10-CM

## 2018-02-28 DIAGNOSIS — R109 Unspecified abdominal pain: Secondary | ICD-10-CM

## 2018-02-28 DIAGNOSIS — Z3403 Encounter for supervision of normal first pregnancy, third trimester: Secondary | ICD-10-CM

## 2018-02-28 DIAGNOSIS — J45909 Unspecified asthma, uncomplicated: Secondary | ICD-10-CM | POA: Insufficient documentation

## 2018-02-28 DIAGNOSIS — Z79899 Other long term (current) drug therapy: Secondary | ICD-10-CM | POA: Insufficient documentation

## 2018-02-28 DIAGNOSIS — Z331 Pregnant state, incidental: Secondary | ICD-10-CM

## 2018-02-28 DIAGNOSIS — Z3A33 33 weeks gestation of pregnancy: Secondary | ICD-10-CM | POA: Insufficient documentation

## 2018-02-28 DIAGNOSIS — O2313 Infections of bladder in pregnancy, third trimester: Secondary | ICD-10-CM | POA: Insufficient documentation

## 2018-02-28 DIAGNOSIS — O219 Vomiting of pregnancy, unspecified: Secondary | ICD-10-CM | POA: Diagnosis present

## 2018-02-28 DIAGNOSIS — Z3490 Encounter for supervision of normal pregnancy, unspecified, unspecified trimester: Secondary | ICD-10-CM

## 2018-02-28 DIAGNOSIS — O99513 Diseases of the respiratory system complicating pregnancy, third trimester: Secondary | ICD-10-CM | POA: Diagnosis not present

## 2018-02-28 DIAGNOSIS — R1084 Generalized abdominal pain: Secondary | ICD-10-CM | POA: Insufficient documentation

## 2018-02-28 DIAGNOSIS — Z1389 Encounter for screening for other disorder: Secondary | ICD-10-CM

## 2018-02-28 DIAGNOSIS — O9989 Other specified diseases and conditions complicating pregnancy, childbirth and the puerperium: Secondary | ICD-10-CM | POA: Insufficient documentation

## 2018-02-28 DIAGNOSIS — Z87891 Personal history of nicotine dependence: Secondary | ICD-10-CM | POA: Diagnosis not present

## 2018-02-28 LAB — CBC WITH DIFFERENTIAL/PLATELET
Abs Immature Granulocytes: 0.11 10*3/uL — ABNORMAL HIGH (ref 0.00–0.07)
BASOS ABS: 0 10*3/uL (ref 0.0–0.1)
Basophils Relative: 0 %
EOS ABS: 0.1 10*3/uL (ref 0.0–0.5)
Eosinophils Relative: 1 %
HCT: 34.1 % — ABNORMAL LOW (ref 36.0–46.0)
Hemoglobin: 11.1 g/dL — ABNORMAL LOW (ref 12.0–15.0)
Immature Granulocytes: 1 %
LYMPHS ABS: 1.4 10*3/uL (ref 0.7–4.0)
Lymphocytes Relative: 10 %
MCH: 32.4 pg (ref 26.0–34.0)
MCHC: 32.6 g/dL (ref 30.0–36.0)
MCV: 99.4 fL (ref 80.0–100.0)
Monocytes Absolute: 0.9 10*3/uL (ref 0.1–1.0)
Monocytes Relative: 6 %
Neutro Abs: 12 10*3/uL — ABNORMAL HIGH (ref 1.7–7.7)
Neutrophils Relative %: 82 %
Platelets: 229 10*3/uL (ref 150–400)
RBC: 3.43 MIL/uL — ABNORMAL LOW (ref 3.87–5.11)
RDW: 13.8 % (ref 11.5–15.5)
WBC: 14.6 10*3/uL — ABNORMAL HIGH (ref 4.0–10.5)
nRBC: 0 % (ref 0.0–0.2)

## 2018-02-28 LAB — URINALYSIS, ROUTINE W REFLEX MICROSCOPIC
Bilirubin Urine: NEGATIVE
Glucose, UA: NEGATIVE mg/dL
Ketones, ur: NEGATIVE mg/dL
Nitrite: NEGATIVE
Protein, ur: NEGATIVE mg/dL
Specific Gravity, Urine: 1.008 (ref 1.005–1.030)
WBC, UA: >50 WBC/hpf — ABNORMAL HIGH (ref 0–5)
pH: 6 (ref 5.0–8.0)

## 2018-02-28 LAB — COMPREHENSIVE METABOLIC PANEL
ALT: 13 U/L (ref 0–44)
AST: 13 U/L — ABNORMAL LOW (ref 15–41)
Albumin: 3.3 g/dL — ABNORMAL LOW (ref 3.5–5.0)
Alkaline Phosphatase: 71 U/L (ref 38–126)
Anion gap: 7 (ref 5–15)
BUN: 6 mg/dL (ref 6–20)
CO2: 21 mmol/L — ABNORMAL LOW (ref 22–32)
Calcium: 8.5 mg/dL — ABNORMAL LOW (ref 8.9–10.3)
Chloride: 107 mmol/L (ref 98–111)
Creatinine, Ser: 0.42 mg/dL — ABNORMAL LOW (ref 0.44–1.00)
GFR calc Af Amer: >60 mL/min (ref >60)
GFR calc non Af Amer: >60 mL/min (ref >60)
Glucose, Bld: 83 mg/dL (ref 70–99)
POTASSIUM: 3.7 mmol/L (ref 3.5–5.1)
Sodium: 135 mmol/L (ref 135–145)
TOTAL PROTEIN: 6.6 g/dL (ref 6.5–8.1)
Total Bilirubin: 0.4 mg/dL (ref 0.3–1.2)

## 2018-02-28 MED ORDER — SODIUM CHLORIDE 0.9 % IV BOLUS
1000.0000 mL | Freq: Once | INTRAVENOUS | Status: AC
  Administered 2018-02-28: 1000 mL via INTRAVENOUS

## 2018-02-28 MED ORDER — ONDANSETRON HCL 4 MG/2ML IJ SOLN
4.0000 mg | Freq: Once | INTRAMUSCULAR | Status: AC
  Administered 2018-02-28: 4 mg via INTRAVENOUS
  Filled 2018-02-28: qty 2

## 2018-02-28 MED ORDER — SODIUM CHLORIDE 0.9 % IV SOLN
1.0000 g | Freq: Once | INTRAVENOUS | Status: AC
  Administered 2018-02-28: 1 g via INTRAVENOUS
  Filled 2018-02-28: qty 10

## 2018-02-28 MED ORDER — CEPHALEXIN 500 MG PO CAPS: 500.0000 mg | ORAL_CAPSULE | Freq: Four times a day (QID) | ORAL | 0 refills | Status: DC

## 2018-02-28 NOTE — ED Provider Notes (Signed)
[facility] Provider Note   CSN: [id number] Arrival date & time: 02/28/18  1427     History   Chief Complaint Chief Complaint  Patient presents with  . Emesis    HPI Gina Snow is a 28 y.o. female.  G1, P0 approximately [redacted] weeks pregnant presents with generalized cramping since last night.  No vaginal bleeding, vaginal discharge, fever, sweats, chills, dysuria.  She says she is dehydrated.  Good fetal movement.  Past medical history includes polycystic ovaries, glucose intolerance, obesity.  Severity of symptoms is moderate.  Nothing makes symptoms better or worse.     Past Medical History:  Diagnosis Date  . Asthma   . Obesity   . PCO (polycystic ovaries)     Patient Active Problem List   Diagnosis Date Noted  . Inadequate social support FOB died 12/09/2017 01-24-18  . CIN III (cervical intraepithelial neoplasia grade III) with severe dysplasia 01-24-2018  . Supervision of normal first pregnancy 10/03/2017  . Glucose intolerance (impaired glucose tolerance) 12/21/2012  . PCOS (polycystic ovarian syndrome) 12/21/2012  . Prediabetes 10/26/2012  . Hyperglycemia 09/24/2012  . Tobacco use 09/17/2012    Past Surgical History:  Procedure Laterality Date  . NO PAST SURGERIES       OB History    Gravida  1   Para      Term      Preterm      AB      Living        SAB      TAB      Ectopic      Multiple      Live Births               Home Medications    Prior to Admission medications   Medication Sig Start Date End Date Taking? Authorizing Provider  acetaminophen (TYLENOL) 500 MG tablet Take 500 mg by mouth every 6 (six) hours as needed for mild pain.   Yes [provider]  albuterol (PROVENTIL HFA;VENTOLIN HFA) 108 (90 Base) MCG/ACT inhaler Inhale 1-2 puffs into the lungs every 6 (six) hours as needed for wheezing or shortness of breath. 02/12/18  Yes Tacy Learn, PA-C  Prenat w/o  A-[id number]-DSS-FA-DHA (CITRANATAL ASSURE) 35-1 & 300 MG tablet One tablet and one capsule daily Patient taking differently: Take 2 tablets by mouth daily.  10/03/17  Yes Roma Schanz, CNM  cephALEXin (KEFLEX) 500 MG capsule Take 1 capsule (500 mg total) by mouth 4 (four) times daily. 02/28/18   Nat Christen, MD    Family History Family History  Problem Relation Age of Onset  . Clotting disorder Mother 42       had "brain" clot?  Marland Kitchen Hypertension Maternal Grandmother   . Diabetes Maternal Grandmother   . Thyroid disease Maternal Grandmother   . Hypertension Maternal Grandfather   . Diabetes Paternal Grandmother     Social History Social History   Tobacco Use  . Smoking status: Former Smoker    Packs/day: 0.25    Years: 8.00    Pack years: 2.00    Types: Cigarettes  . Smokeless tobacco: Never Used  . Tobacco comment: 2-3 per day  Substance Use Topics  . Alcohol use: Not Currently    Comment: every once in a while  . Drug use: No     Allergies   Patient has no known allergies.   Review of Systems Review of Systems  All other  systems reviewed and are negative.    Physical Exam Updated Vital Signs BP (!) 117/58 (BP Location: Left Arm)   Pulse 88   Resp 15   Ht 5\' 4"  (1.626 m)   Wt 111.6 kg   LMP 08/03/2017   SpO2 100%   BMI 42.23 kg/m   Physical Exam Vitals signs and nursing note reviewed.  Constitutional:      Appearance: She is well-developed.     Comments: Gravid, nad  HENT:     Head: Normocephalic and atraumatic.  Eyes:     Conjunctiva/sclera: Conjunctivae normal.  Neck:     Musculoskeletal: Neck supple.  Cardiovascular:     Rate and Rhythm: Normal rate and regular rhythm.  Pulmonary:     Effort: Pulmonary effort is normal.     Breath sounds: Normal breath sounds.  Abdominal:     General: Bowel sounds are normal.     Palpations: Abdomen is soft.     Comments: No frank point tenderness.  Musculoskeletal: Normal range of motion.  Skin:     General: Skin is warm and dry.  Neurological:     Mental Status: She is alert and oriented to person, place, and time.  Psychiatric:        Behavior: Behavior normal.      ED Treatments / Results  Labs (all labs ordered are listed, but only abnormal results are displayed) Labs Reviewed  CBC WITH DIFFERENTIAL/PLATELET - Abnormal; Notable for the following components:      Result Value   WBC 14.6 (*)    RBC 3.43 (*)    Hemoglobin 11.1 (*)    HCT 34.1 (*)    Neutro Abs 12.0 (*)    Abs Immature Granulocytes 0.11 (*)    All other components within normal limits  COMPREHENSIVE METABOLIC PANEL - Abnormal; Notable for the following components:   CO2 21 (*)    Creatinine, Ser 0.42 (*)    Calcium 8.5 (*)    Albumin 3.3 (*)    AST 13 (*)    All other components within normal limits  URINALYSIS, ROUTINE W REFLEX MICROSCOPIC - Abnormal; Notable for the following components:   APPearance CLOUDY (*)    Hgb urine dipstick SMALL (*)    Leukocytes, UA LARGE (*)    WBC, UA >50 (*)    Bacteria, UA MANY (*)    All other components within normal limits  URINE CULTURE    EKG None  Radiology No results found.  Procedures Procedures (including critical care time)  Medications Ordered in ED Medications  cefTRIAXone (ROCEPHIN) 1 g in sodium chloride 0.9 % 100 mL IVPB (1 g Intravenous New Bag/Given 02/28/18 1859)  ondansetron (ZOFRAN) injection 4 mg (4 mg Intravenous Given 02/28/18 1520)  sodium chloride 0.9 % bolus 1,000 mL (0 mLs Intravenous Stopped 02/28/18 1621)     Initial Impression / Assessment and Plan / ED Course  I have reviewed the triage vital signs and the nursing notes.  Pertinent labs & imaging results that were available during my care of the patient were reviewed by me and considered in my medical decision making (see chart for details).     Pregnant patient presents with generalized abdominal cramping.  A fetal monitoring consult was obtained via [facility].   This was deemed normal.  Urinalysis shows evidence of infection.  IV fluids, IV Rocephin, urine culture.  Discharge medication Keflex 500 mg.  Final Clinical Impressions(s) / ED Diagnoses   Final diagnoses:  Pregnancy,  unspecified gestational age  Abdominal cramping  Urinary tract infection without hematuria, site unspecified    ED Discharge Orders         Ordered    cephALEXin (KEFLEX) 500 MG capsule  4 times daily     02/28/18 1917           Nat Christen, MD 02/28/18 1929

## 2018-02-28 NOTE — Discharge Instructions (Addendum)
You have a urinary tract infection.  Increase fluids.  Prescription for antibiotic which she can start tomorrow morning.  Follow-up with your OB/GYN.

## 2018-02-28 NOTE — ED Notes (Signed)
ED Provider at bedside. 

## 2018-02-28 NOTE — Progress Notes (Signed)
1442 Received call about this 28 yo G1P0 @ 32.[redacted] wks GA in with complaints of abdominal cramping, vomiting x1 and mucous discharge 2.5 days ago. Pt denies vaginal bleeding or LOF and reports good fetal movement.1532 FHR Category I, no UC's tracing. Dr. Si Raider notified of pt in ED and of above. Suspicion for PTL is low.  Dr. Si Raider recommends RUQ work up if pt complains of pain in RUQ abd. Pt can be OB cleared.

## 2018-02-28 NOTE — ED Notes (Signed)
Notified Erin Rapid Response RN of pt.  Reports will monitor pt.

## 2018-02-28 NOTE — ED Triage Notes (Signed)
abd cramps since last night and vomiting that started today

## 2018-02-28 NOTE — Progress Notes (Signed)
Subjective:  [facility] is a 28 y.o. G1P0 at [redacted]w[redacted]d being seen today for ongoing prenatal care.  She is currently monitored for the following issues for this low-risk pregnancy and has Tobacco use; Hyperglycemia; Prediabetes; Glucose intolerance (impaired glucose tolerance); PCOS (polycystic ovarian syndrome); Supervision of normal first pregnancy; Inadequate social support FOB died 2017/12/10; and CIN III (cervical intraepithelial neoplasia grade III) with severe dysplasia on their problem list.  Patient reports general discomforts of pregnancy.  Contractions: Not present. Vag. Bleeding: None.  Movement: Present. Denies leaking of fluid.   The following portions of the patient's history were reviewed and updated as appropriate: allergies, current medications, past family history, past medical history, past social history, past surgical history and problem list. Problem list updated.  Objective:   Vitals:   02/28/18 1112  BP: 134/72  Pulse: 84  Weight: 246 lb (111.6 kg)    Fetal Status:     Movement: Present     General:  Alert, oriented and cooperative. Patient is in no acute distress.  Skin: Skin is warm and dry. No rash noted.   Cardiovascular: Normal heart rate noted  Respiratory: Normal respiratory effort, no problems with respiration noted  Abdomen: Soft, gravid, appropriate for gestational age. Pain/Pressure: Absent     Pelvic:  Cervical exam deferred        Extremities: Normal range of motion.  Edema: Trace  Mental Status: Normal mood and affect. Normal behavior. Normal judgment and thought content.   Urinalysis:      Assessment and Plan:  Pregnancy: G1P0 at [redacted]w[redacted]d  1. Pregnant state, incidental Stable - POC Urinalysis Dipstick OB  2. Screening for genitourinary condition  - POC Urinalysis Dipstick OB  Preterm labor symptoms and general obstetric precautions including but not limited to vaginal bleeding, contractions, leaking of fluid and fetal movement were reviewed  in detail with the patient. Please refer to After Visit Summary for other counseling recommendations.  Return in about 2 weeks (around 03/14/2018) for OB visit.   Chancy Milroy, MD

## 2018-02-28 NOTE — Patient Instructions (Signed)
Third Trimester of Pregnancy The third trimester is from week 28 through week 40 (months 7 through 9). The third trimester is a time when the unborn baby (fetus) is growing rapidly. At the end of the ninth month, the fetus is about 20 inches in length and weighs 6-10 pounds. Body changes during your third trimester Your body will continue to go through many changes during pregnancy. The changes vary from woman to woman. During the third trimester:  Your weight will continue to increase. You can expect to gain 25-35 pounds (11-16 kg) by the end of the pregnancy.  You may begin to get stretch marks on your hips, abdomen, and breasts.  You may urinate more often because the fetus is moving lower into your pelvis and pressing on your bladder.  You may develop or continue to have heartburn. This is caused by increased hormones that slow down muscles in the digestive tract.  You may develop or continue to have constipation because increased hormones slow digestion and cause the muscles that push waste through your intestines to relax.  You may develop hemorrhoids. These are swollen veins (varicose veins) in the rectum that can itch or be painful.  You may develop swollen, bulging veins (varicose veins) in your legs.  You may have increased body aches in the pelvis, back, or thighs. This is due to weight gain and increased hormones that are relaxing your joints.  You may have changes in your hair. These can include thickening of your hair, rapid growth, and changes in texture. Some women also have hair loss during or after pregnancy, or hair that feels dry or thin. Your hair will most likely return to normal after your baby is born.  Your breasts will continue to grow and they will continue to become tender. A yellow fluid (colostrum) may leak from your breasts. This is the first milk you are producing for your baby.  Your belly button may stick out.  You may notice more swelling in your hands,  face, or ankles.  You may have increased tingling or numbness in your hands, arms, and legs. The skin on your belly may also feel numb.  You may feel short of breath because of your expanding uterus.  You may have more problems sleeping. This can be caused by the size of your belly, increased need to urinate, and an increase in your body's metabolism.  You may notice the fetus "dropping," or moving lower in your abdomen (lightening).  You may have increased vaginal discharge.  You may notice your joints feel loose and you may have pain around your pelvic bone. What to expect at prenatal visits You will have prenatal exams every 2 weeks until week 36. Then you will have weekly prenatal exams. During a routine prenatal visit:  You will be weighed to make sure you and the baby are growing normally.  Your blood pressure will be taken.  Your abdomen will be measured to track your baby's growth.  The fetal heartbeat will be listened to.  Any test results from the previous visit will be discussed.  You may have a cervical check near your due date to see if your cervix has softened or thinned (effaced).  You will be tested for Group B streptococcus. This happens between 35 and 37 weeks. Your health care provider may ask you:  What your birth plan is.  How you are feeling.  If you are feeling the baby move.  If you have had any abnormal   symptoms, such as leaking fluid, bleeding, severe headaches, or abdominal cramping.  If you are using any tobacco products, including cigarettes, chewing tobacco, and electronic cigarettes.  If you have any questions. Other tests or screenings that may be performed during your third trimester include:  Blood tests that check for low iron levels (anemia).  Fetal testing to check the health, activity level, and growth of the fetus. Testing is done if you have certain medical conditions or if there are problems during the pregnancy.  Nonstress test  (NST). This test checks the health of your baby to make sure there are no signs of problems, such as the baby not getting enough oxygen. During this test, a belt is placed around your belly. The baby is made to move, and its heart rate is monitored during movement. What is false labor? False labor is a condition in which you feel small, irregular tightenings of the muscles in the womb (contractions) that usually go away with rest, changing position, or drinking water. These are called Braxton Hicks contractions. Contractions may last for hours, days, or even weeks before true labor sets in. If contractions come at regular intervals, become more frequent, increase in intensity, or become painful, you should see your health care provider. What are the signs of labor?  Abdominal cramps.  Regular contractions that start at 10 minutes apart and become stronger and more frequent with time.  Contractions that start on the top of the uterus and spread down to the lower abdomen and back.  Increased pelvic pressure and dull back pain.  A watery or bloody mucus discharge that comes from the vagina.  Leaking of amniotic fluid. This is also known as your "water breaking." It could be a slow trickle or a gush. Let your health care provider know if it has a color or strange odor. If you have any of these signs, call your health care provider right away, even if it is before your due date. Follow these instructions at home: Medicines  Follow your health care provider's instructions regarding medicine use. Specific medicines may be either safe or unsafe to take during pregnancy.  Take a prenatal vitamin that contains at least 600 micrograms (mcg) of folic acid.  If you develop constipation, try taking a stool softener if your health care provider approves. Eating and drinking   Eat a balanced diet that includes fresh fruits and vegetables, whole grains, good sources of protein such as meat, eggs, or tofu,  and low-fat dairy. Your health care provider will help you determine the amount of weight gain that is right for you.  Avoid raw meat and uncooked cheese. These carry germs that can cause birth defects in the baby.  If you have low calcium intake from food, talk to your health care provider about whether you should take a daily calcium supplement.  Eat four or five small meals rather than three large meals a day.  Limit foods that are high in fat and processed sugars, such as fried and sweet foods.  To prevent constipation: ? Drink enough fluid to keep your urine clear or pale yellow. ? Eat foods that are high in fiber, such as fresh fruits and vegetables, whole grains, and beans. Activity  Exercise only as directed by your health care provider. Most women can continue their usual exercise routine during pregnancy. Try to exercise for 30 minutes at least 5 days a week. Stop exercising if you experience uterine contractions.  Avoid heavy lifting.  Do   not exercise in extreme heat or humidity, or at high altitudes.  Wear low-heel, comfortable shoes.  Practice good posture.  You may continue to have sex unless your health care provider tells you otherwise. Relieving pain and discomfort  Take frequent breaks and rest with your legs elevated if you have leg cramps or low back pain.  Take warm sitz baths to soothe any pain or discomfort caused by hemorrhoids. Use hemorrhoid cream if your health care provider approves.  Wear a good support bra to prevent discomfort from breast tenderness.  If you develop varicose veins: ? Wear support pantyhose or compression stockings as told by your healthcare provider. ? Elevate your feet for 15 minutes, 3-4 times a day. Prenatal care  Write down your questions. Take them to your prenatal visits.  Keep all your prenatal visits as told by your health care provider. This is important. Safety  Wear your seat belt at all times when driving.  Make  a list of emergency phone numbers, including numbers for family, friends, the hospital, and police and fire departments. General instructions  Avoid cat litter boxes and soil used by cats. These carry germs that can cause birth defects in the baby. If you have a cat, ask someone to clean the litter box for you.  Do not travel far distances unless it is absolutely necessary and only with the approval of your health care provider.  Do not use hot tubs, steam rooms, or saunas.  Do not drink alcohol.  Do not use any products that contain nicotine or tobacco, such as cigarettes and e-cigarettes. If you need help quitting, ask your health care provider.  Do not use any medicinal herbs or unprescribed drugs. These chemicals affect the formation and growth of the baby.  Do not douche or use tampons or scented sanitary pads.  Do not cross your legs for long periods of time.  To prepare for the arrival of your baby: ? Take prenatal classes to understand, practice, and ask questions about labor and delivery. ? Make a trial run to the hospital. ? Visit the hospital and tour the maternity area. ? Arrange for maternity or paternity leave through employers. ? Arrange for family and friends to take care of pets while you are in the hospital. ? Purchase a rear-facing car seat and make sure you know how to install it in your car. ? Pack your hospital bag. ? Prepare the baby's nursery. Make sure to remove all pillows and stuffed animals from the baby's crib to prevent suffocation.  Visit your dentist if you have not gone during your pregnancy. Use a soft toothbrush to brush your teeth and be gentle when you floss. Contact a health care provider if:  You are unsure if you are in labor or if your water has broken.  You become dizzy.  You have mild pelvic cramps, pelvic pressure, or nagging pain in your abdominal area.  You have lower back pain.  You have persistent nausea, vomiting, or  diarrhea.  You have an unusual or bad smelling vaginal discharge.  You have pain when you urinate. Get help right away if:  Your water breaks before 37 weeks.  You have regular contractions less than 5 minutes apart before 37 weeks.  You have a fever.  You are leaking fluid from your vagina.  You have spotting or bleeding from your vagina.  You have severe abdominal pain or cramping.  You have rapid weight loss or weight gain.  You have   shortness of breath with chest pain.  You notice sudden or extreme swelling of your face, hands, ankles, feet, or legs.  Your baby makes fewer than 10 movements in 2 hours.  You have severe headaches that do not go away when you take medicine.  You have vision changes. Summary  The third trimester is from week 28 through week 40, months 7 through 9. The third trimester is a time when the unborn baby (fetus) is growing rapidly.  During the third trimester, your discomfort may increase as you and your baby continue to gain weight. You may have abdominal, leg, and back pain, sleeping problems, and an increased need to urinate.  During the third trimester your breasts will keep growing and they will continue to become tender. A yellow fluid (colostrum) may leak from your breasts. This is the first milk you are producing for your baby.  False labor is a condition in which you feel small, irregular tightenings of the muscles in the womb (contractions) that eventually go away. These are called Braxton Hicks contractions. Contractions may last for hours, days, or even weeks before true labor sets in.  Signs of labor can include: abdominal cramps; regular contractions that start at 10 minutes apart and become stronger and more frequent with time; watery or bloody mucus discharge that comes from the vagina; increased pelvic pressure and dull back pain; and leaking of amniotic fluid. This information is not intended to replace advice given to you by your  health care provider. Make sure you discuss any questions you have with your health care provider. Document Released: 01/25/2001 Document Revised: 03/08/2016 Document Reviewed: 03/08/2016 [employer] Interactive Patient Education  2019 [employer]  

## 2018-03-01 LAB — URINE CULTURE

## 2018-03-08 ENCOUNTER — Other Ambulatory Visit: Payer: Self-pay

## 2018-03-08 ENCOUNTER — Encounter (HOSPITAL_COMMUNITY): Payer: Self-pay

## 2018-03-08 ENCOUNTER — Inpatient Hospital Stay (HOSPITAL_COMMUNITY)
Admission: EM | Admit: 2018-03-08 | Discharge: 2018-03-16 | DRG: 807 | Disposition: A | Discharge status: Home / Self Care | Payer: Medicaid Other | Attending: Obstetrics and Gynecology | Admitting: Obstetrics and Gynecology

## 2018-03-08 DIAGNOSIS — R879 Unspecified abnormal finding in specimens from female genital organs: Secondary | ICD-10-CM

## 2018-03-08 DIAGNOSIS — Z8742 Personal history of other diseases of the female genital tract: Secondary | ICD-10-CM | POA: Diagnosis present

## 2018-03-08 DIAGNOSIS — O26893 Other specified pregnancy related conditions, third trimester: Secondary | ICD-10-CM

## 2018-03-08 DIAGNOSIS — Z3A33 33 weeks gestation of pregnancy: Secondary | ICD-10-CM

## 2018-03-08 DIAGNOSIS — Z658 Other specified problems related to psychosocial circumstances: Secondary | ICD-10-CM

## 2018-03-08 DIAGNOSIS — Z720 Tobacco use: Secondary | ICD-10-CM | POA: Diagnosis present

## 2018-03-08 DIAGNOSIS — O42913 Preterm premature rupture of membranes, unspecified as to length of time between rupture and onset of labor, third trimester: Principal | ICD-10-CM | POA: Diagnosis present

## 2018-03-08 DIAGNOSIS — O99824 Streptococcus B carrier state complicating childbirth: Secondary | ICD-10-CM | POA: Diagnosis present

## 2018-03-08 DIAGNOSIS — O42919 Preterm premature rupture of membranes, unspecified as to length of time between rupture and onset of labor, unspecified trimester: Secondary | ICD-10-CM | POA: Diagnosis present

## 2018-03-08 DIAGNOSIS — O9952 Diseases of the respiratory system complicating childbirth: Secondary | ICD-10-CM | POA: Diagnosis present

## 2018-03-08 DIAGNOSIS — O9A12 Malignant neoplasm complicating childbirth: Secondary | ICD-10-CM | POA: Diagnosis present

## 2018-03-08 DIAGNOSIS — J45909 Unspecified asthma, uncomplicated: Secondary | ICD-10-CM | POA: Diagnosis present

## 2018-03-08 DIAGNOSIS — Z23 Encounter for immunization: Secondary | ICD-10-CM

## 2018-03-08 DIAGNOSIS — Z87891 Personal history of nicotine dependence: Secondary | ICD-10-CM

## 2018-03-08 DIAGNOSIS — D069 Carcinoma in situ of cervix, unspecified: Secondary | ICD-10-CM | POA: Diagnosis present

## 2018-03-08 DIAGNOSIS — O99214 Obesity complicating childbirth: Secondary | ICD-10-CM | POA: Diagnosis present

## 2018-03-08 DIAGNOSIS — R7303 Prediabetes: Secondary | ICD-10-CM | POA: Diagnosis present

## 2018-03-08 DIAGNOSIS — Z3402 Encounter for supervision of normal first pregnancy, second trimester: Secondary | ICD-10-CM

## 2018-03-08 DIAGNOSIS — N898 Other specified noninflammatory disorders of vagina: Secondary | ICD-10-CM

## 2018-03-08 NOTE — ED Provider Notes (Signed)
[facility] EMERGENCY DEPARTMENT Provider Note   CSN: [id number] Arrival date & time: 03/08/18  2212     History   Chief Complaint Chief Complaint  Patient presents with  . Pregnant-fluid leaking    HPI Gina Snow is a 28 y.o. female.  Patient reports that she is [redacted] weeks pregnant and she had sudden loss of fluid tonight.  She reports that she suddenly felt wet and the entire center of her pants was soaked.  She has been feeling well through the course of today.  She has not had any abdominal pain, cramping or contractions.  She denies any injury.  She has not performed any manual labor or strained at all.     Past Medical History:  Diagnosis Date  . Asthma   . Obesity   . PCO (polycystic ovaries)     Patient Active Problem List   Diagnosis Date Noted  . Inadequate social support FOB died December 12, 2017 January 27, 2018  . CIN III (cervical intraepithelial neoplasia grade III) with severe dysplasia 27-Jan-2018  . Supervision of normal first pregnancy 10/03/2017  . Glucose intolerance (impaired glucose tolerance) 12/21/2012  . PCOS (polycystic ovarian syndrome) 12/21/2012  . Prediabetes 10/26/2012  . Hyperglycemia 09/24/2012  . Tobacco use 09/17/2012    Past Surgical History:  Procedure Laterality Date  . NO PAST SURGERIES       OB History    Gravida  1   Para      Term      Preterm      AB      Living        SAB      TAB      Ectopic      Multiple      Live Births               Home Medications    Prior to Admission medications   Medication Sig Start Date End Date Taking? Authorizing Provider  acetaminophen (TYLENOL) 500 MG tablet Take 500 mg by mouth every 6 (six) hours as needed for mild pain.    [provider]  albuterol (PROVENTIL HFA;VENTOLIN HFA) 108 (90 Base) MCG/ACT inhaler Inhale 1-2 puffs into the lungs every 6 (six) hours as needed for wheezing or shortness of breath. 02/12/18   Tacy Learn, PA-C  cephALEXin  (KEFLEX) 500 MG capsule Take 1 capsule (500 mg total) by mouth 4 (four) times daily. 02/28/18   Nat Christen, MD  Prenat w/o A-FeCbGl-DSS-FA-DHA (CITRANATAL ASSURE) 35-1 & 300 MG tablet One tablet and one capsule daily Patient taking differently: Take 2 tablets by mouth daily.  10/03/17   Roma Schanz, CNM    Family History Family History  Problem Relation Age of Onset  . Clotting disorder Mother 34       had "brain" clot?  Marland Kitchen Hypertension Maternal Grandmother   . Diabetes Maternal Grandmother   . Thyroid disease Maternal Grandmother   . Hypertension Maternal Grandfather   . Diabetes Paternal Grandmother     Social History Social History   Tobacco Use  . Smoking status: Former Smoker    Packs/day: 0.25    Years: 8.00    Pack years: 2.00    Types: Cigarettes  . Smokeless tobacco: Never Used  . Tobacco comment: 2-3 per day  Substance Use Topics  . Alcohol use: Not Currently    Comment: every once in a while  . Drug use: No     Allergies   Patient  has no known allergies.   Review of Systems Review of Systems  Genitourinary: Negative for pelvic pain and vaginal bleeding.  All other systems reviewed and are negative.    Physical Exam Updated Vital Signs BP 139/65 (BP Location: Right Arm)   Pulse 99   Temp 98.3 F (36.8 C) (Oral)   Resp 16   Ht 5\' 4"  (1.626 m)   Wt 111.6 kg   LMP 08/03/2017   SpO2 99%   BMI 42.23 kg/m   Physical Exam Vitals signs and nursing note reviewed.  Constitutional:      General: She is not in acute distress.    Appearance: Normal appearance. She is well-developed.  HENT:     Head: Normocephalic and atraumatic.     Right Ear: Hearing normal.     Left Ear: Hearing normal.     Nose: Nose normal.  Eyes:     Conjunctiva/sclera: Conjunctivae normal.     Pupils: Pupils are equal, round, and reactive to light.  Neck:     Musculoskeletal: Normal range of motion and neck supple.  Cardiovascular:     Rate and Rhythm: Regular  rhythm.     Heart sounds: S1 normal and S2 normal. No murmur. No friction rub. No gallop.   Pulmonary:     Effort: Pulmonary effort is normal. No respiratory distress.     Breath sounds: Normal breath sounds.  Chest:     Chest wall: No tenderness.  Abdominal:     General: Bowel sounds are normal.     Palpations: Abdomen is soft.     Tenderness: There is no abdominal tenderness. There is no guarding or rebound. Negative signs include Murphy's sign and McBurney's sign.     Hernia: No hernia is present.  Musculoskeletal: Normal range of motion.  Skin:    General: Skin is warm and dry.     Findings: No rash.  Neurological:     Mental Status: She is alert and oriented to person, place, and time.     GCS: GCS eye subscore is 4. GCS verbal subscore is 5. GCS motor subscore is 6.     Cranial Nerves: No cranial nerve deficit.     Sensory: No sensory deficit.     Coordination: Coordination normal.  Psychiatric:        Speech: Speech normal.        Behavior: Behavior normal.        Thought Content: Thought content normal.      ED Treatments / Results  Labs (all labs ordered are listed, but only abnormal results are displayed) Labs Reviewed  URINALYSIS, ROUTINE W REFLEX MICROSCOPIC  POCT NITRAZINE TEST    EKG None  Radiology No results found.  Procedures Procedures (including critical care time)  Medications Ordered in ED Medications - No data to display   Initial Impression / Assessment and Plan / ED Course  I have reviewed the triage vital signs and the nursing notes.  Pertinent labs & imaging results that were available during my care of the patient were reviewed by me and considered in my medical decision making (see chart for details).     Patient arrives with concerns over loss of amniotic fluid which happened suddenly tonight.  She is [redacted] weeks pregnant.  She has not perceiving any contractions.  She has been placed on fetal monitoring.  OB/GYN has reviewed the  images and history and has recommended patient be transferred to [facility] hospital for further monitoring.  Patient is  stable for transfer.  Final Clinical Impressions(s) / ED Diagnoses   Final diagnoses:  Abnormal vaginal fluids    ED Discharge Orders    None       Orpah Greek, MD 03/08/18 2329

## 2018-03-08 NOTE — ED Notes (Signed)
Pt hooked up to monitor and RR notified

## 2018-03-08 NOTE — ED Triage Notes (Signed)
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   Pt denies pain or discomfort, denies bleeding, but reports having some white discharge for a while.

## 2018-03-09 ENCOUNTER — Encounter (HOSPITAL_COMMUNITY): Payer: Self-pay

## 2018-03-09 ENCOUNTER — Inpatient Hospital Stay (HOSPITAL_COMMUNITY): Payer: Medicaid Other

## 2018-03-09 DIAGNOSIS — Z3A33 33 weeks gestation of pregnancy: Secondary | ICD-10-CM

## 2018-03-09 DIAGNOSIS — Z3A34 34 weeks gestation of pregnancy: Secondary | ICD-10-CM | POA: Diagnosis not present

## 2018-03-09 DIAGNOSIS — Z87891 Personal history of nicotine dependence: Secondary | ICD-10-CM | POA: Diagnosis not present

## 2018-03-09 DIAGNOSIS — O42919 Preterm premature rupture of membranes, unspecified as to length of time between rupture and onset of labor, unspecified trimester: Secondary | ICD-10-CM | POA: Diagnosis not present

## 2018-03-09 DIAGNOSIS — O26893 Other specified pregnancy related conditions, third trimester: Secondary | ICD-10-CM

## 2018-03-09 DIAGNOSIS — D069 Carcinoma in situ of cervix, unspecified: Secondary | ICD-10-CM | POA: Diagnosis present

## 2018-03-09 DIAGNOSIS — O9952 Diseases of the respiratory system complicating childbirth: Secondary | ICD-10-CM | POA: Diagnosis present

## 2018-03-09 DIAGNOSIS — O9A12 Malignant neoplasm complicating childbirth: Secondary | ICD-10-CM | POA: Diagnosis present

## 2018-03-09 DIAGNOSIS — O42913 Preterm premature rupture of membranes, unspecified as to length of time between rupture and onset of labor, third trimester: Secondary | ICD-10-CM | POA: Diagnosis present

## 2018-03-09 DIAGNOSIS — O99824 Streptococcus B carrier state complicating childbirth: Secondary | ICD-10-CM | POA: Diagnosis present

## 2018-03-09 DIAGNOSIS — O99214 Obesity complicating childbirth: Secondary | ICD-10-CM | POA: Diagnosis present

## 2018-03-09 DIAGNOSIS — Z23 Encounter for immunization: Secondary | ICD-10-CM | POA: Diagnosis not present

## 2018-03-09 DIAGNOSIS — J45909 Unspecified asthma, uncomplicated: Secondary | ICD-10-CM | POA: Diagnosis present

## 2018-03-09 DIAGNOSIS — N898 Other specified noninflammatory disorders of vagina: Secondary | ICD-10-CM

## 2018-03-09 DIAGNOSIS — O42113 Preterm premature rupture of membranes, onset of labor more than 24 hours following rupture, third trimester: Secondary | ICD-10-CM | POA: Diagnosis not present

## 2018-03-09 LAB — CBC
HCT: 34.4 % — ABNORMAL LOW (ref 36.0–46.0)
Hemoglobin: 11.4 g/dL — ABNORMAL LOW (ref 12.0–15.0)
MCH: 32.3 pg (ref 26.0–34.0)
MCHC: 33.1 g/dL (ref 30.0–36.0)
MCV: 97.5 fL (ref 80.0–100.0)
Platelets: 263 10*3/uL (ref 150–400)
RBC: 3.53 MIL/uL — ABNORMAL LOW (ref 3.87–5.11)
RDW: 14.1 % (ref 11.5–15.5)
WBC: 14.8 10*3/uL — ABNORMAL HIGH (ref 4.0–10.5)
nRBC: 0 % (ref 0.0–0.2)

## 2018-03-09 LAB — OB RESULTS CONSOLE GBS: GBS: POSITIVE

## 2018-03-09 LAB — TYPE AND SCREEN
ABO/RH(D): O POS
ANTIBODY SCREEN: NEGATIVE

## 2018-03-09 LAB — OB RESULTS CONSOLE GC/CHLAMYDIA: Gonorrhea: NEGATIVE

## 2018-03-09 LAB — AMNISURE RUPTURE OF MEMBRANE (ROM) NOT AT ARMC: AMNISURE: NEGATIVE

## 2018-03-09 LAB — ABO/RH: ABO/RH(D): O POS

## 2018-03-09 LAB — HIV ANTIBODY (ROUTINE TESTING W REFLEX): HIV SCREEN 4TH GENERATION: NONREACTIVE

## 2018-03-09 LAB — POCT FERN TEST: POCT Fern Test: POSITIVE — AB

## 2018-03-09 MED ORDER — PRENATAL MULTIVITAMIN CH
1.0000 | ORAL_TABLET | Freq: Every day | ORAL | Status: DC
  Administered 2018-03-09 – 2018-03-12 (×4): 1 via ORAL
  Filled 2018-03-09 (×4): qty 1

## 2018-03-09 MED ORDER — BETAMETHASONE SOD PHOS & ACET 6 (3-3) MG/ML IJ SUSP
12.0000 mg | INTRAMUSCULAR | Status: AC
  Administered 2018-03-09 – 2018-03-10 (×2): 12 mg via INTRAMUSCULAR
  Filled 2018-03-09 (×2): qty 2

## 2018-03-09 MED ORDER — SODIUM CHLORIDE 0.9 % IV SOLN
500.0000 mg | INTRAVENOUS | Status: AC
  Administered 2018-03-09 – 2018-03-10 (×2): 500 mg via INTRAVENOUS
  Filled 2018-03-09 (×2): qty 500

## 2018-03-09 MED ORDER — SODIUM CHLORIDE 0.9 % IV SOLN
2.0000 g | Freq: Four times a day (QID) | INTRAVENOUS | Status: AC
  Administered 2018-03-09 – 2018-03-10 (×8): 2 g via INTRAVENOUS
  Filled 2018-03-09 (×8): qty 2

## 2018-03-09 MED ORDER — ACETAMINOPHEN 325 MG PO TABS: 650.0000 mg | ORAL_TABLET | ORAL | Status: DC | PRN

## 2018-03-09 MED ORDER — LACTATED RINGERS IV SOLN
INTRAVENOUS | Status: DC
  Administered 2018-03-09 – 2018-03-10 (×4): via INTRAVENOUS

## 2018-03-09 MED ORDER — DOCUSATE SODIUM 100 MG PO CAPS
100.0000 mg | ORAL_CAPSULE | Freq: Every day | ORAL | Status: DC
  Administered 2018-03-09 – 2018-03-12 (×4): 100 mg via ORAL
  Filled 2018-03-09 (×4): qty 1

## 2018-03-09 MED ORDER — CALCIUM CARBONATE ANTACID 500 MG PO CHEW: 2.0000 | CHEWABLE_TABLET | ORAL | Status: DC | PRN

## 2018-03-09 MED ORDER — ZOLPIDEM TARTRATE 5 MG PO TABS
5.0000 mg | ORAL_TABLET | Freq: Every evening | ORAL | Status: DC | PRN
  Administered 2018-03-11: 5 mg via ORAL
  Filled 2018-03-09: qty 1

## 2018-03-09 MED ORDER — AZITHROMYCIN 500 MG PO TABS
500.0000 mg | ORAL_TABLET | Freq: Every day | ORAL | Status: DC
  Administered 2018-03-11 – 2018-03-12 (×2): 500 mg via ORAL
  Filled 2018-03-09: qty 2
  Filled 2018-03-09: qty 1
  Filled 2018-03-09 (×2): qty 2

## 2018-03-09 MED ORDER — AMOXICILLIN 500 MG PO CAPS
500.0000 mg | ORAL_CAPSULE | Freq: Three times a day (TID) | ORAL | Status: DC
  Administered 2018-03-11 – 2018-03-12 (×6): 500 mg via ORAL
  Filled 2018-03-09 (×8): qty 1

## 2018-03-09 NOTE — Progress Notes (Signed)
Received call from [facility] ED at 2250 regarding patient coming in with chief complaint of "gush of fluid". RN notified OB rapid response nurse that patient was placed on fetal heart rate monitor and denies contractions or pain. OB attending Harraway-Smith notified of patient status at 2323. Instructed to obtain fern slide or amnisure if available and if not to have patient transferred to [facility] hospital for further evaluation. Notified [facility] ED RN about speaking with OB attending and advised of the plan. FHR tracing at a Category 1 w/o CTX. Continue to monitor.   E. Tinlee Navarrette RN

## 2018-03-09 NOTE — MAU Note (Signed)
Pt transferred via [facility] from [facility] with reports of leaking clear watery fluid around 1930. Pt denies pain or vaginal bleeding. Reports good fetal movement.

## 2018-03-09 NOTE — H&P (Signed)
FACULTY PRACTICE ANTEPARTUM ADMISSION HISTORY AND PHYSICAL NOTE   History of Present Illness: Gina Snow is a 28 y.o. G1P0 at [redacted]w[redacted]d admitted for PPROM, which occurred at 2030 on 1/23.   Patient reports the fetal movement as active. Patient reports uterine contraction  activity as none. Patient reports  vaginal bleeding as none. Patient describes fluid per vagina as Clear. Fetal presentation is cephalic by bedside US.  Patient Active Problem List   Diagnosis Date Noted  . Preterm premature rupture of membranes (PPROM) with unknown onset of labor 03/09/2018  . Inadequate social support FOB died 2017-11-23 2018/01/08  . CIN III (cervical intraepithelial neoplasia grade III) with severe dysplasia Jan 08, 2018  . Supervision of normal first pregnancy 10/03/2017  . Glucose intolerance (impaired glucose tolerance) 12/21/2012  . PCOS (polycystic ovarian syndrome) 12/21/2012  . Prediabetes 10/26/2012  . Hyperglycemia 09/24/2012  . Tobacco use 09/17/2012    Past Medical History:  Diagnosis Date  . Asthma   . Obesity   . PCO (polycystic ovaries)     Past Surgical History:  Procedure Laterality Date  . NO PAST SURGERIES      OB History  Gravida Para Term Preterm AB Living  1            SAB TAB Ectopic Multiple Live Births               # Outcome Date GA Lbr Len/2nd Weight Sex Delivery Anes PTL Lv  1 Current             Social History   Socioeconomic History  . Marital status: Single    Spouse name: Buzzy Han  . Number of children: 0  . Years of education: 29  . Highest education level: High school graduate  Occupational History  . Not on file  Social Needs  . Financial resource strain: Not very hard  . Food insecurity:    Worry: Never true    Inability: Never true  . Transportation needs:    Medical: No    Non-medical: No  Tobacco Use  . Smoking status: Former Smoker    Packs/day: 0.25    Years: 8.00    Pack years: 2.00    Types: Cigarettes  .  Smokeless tobacco: Never Used  . Tobacco comment: 2-3 per day  Substance and Sexual Activity  . Alcohol use: Not Currently    Comment: every once in a while  . Drug use: No  . Sexual activity: Not Currently    Birth control/protection: None  Lifestyle  . Physical activity:    Days per week: 7 days    Minutes per session: 90 min  . Stress: Only a little  Relationships  . Social connections:    Talks on phone: More than three times a week    Gets together: Twice a week    Attends religious service: Never    Active member of club or organization: No    Attends meetings of clubs or organizations: Never    Relationship status: Patient refused  Other Topics Concern  . Not on file  Social History Narrative  . Not on file    Family History  Problem Relation Age of Onset  . Clotting disorder Mother 60       had "brain" clot?  Marland Kitchen Hypertension Maternal Grandmother   . Diabetes Maternal Grandmother   . Thyroid disease Maternal Grandmother   . Hypertension Maternal Grandfather   . Diabetes Paternal Grandmother     No  Known Allergies  Medications Prior to Admission  Medication Sig Dispense Refill Last Dose  . Prenat w/o A-FeCbGl-DSS-FA-DHA (CITRANATAL ASSURE) 35-1 & 300 MG tablet One tablet and one capsule daily (Patient taking differently: Take 2 tablets by mouth daily. ) 60 tablet 11 03/09/2018 at Unknown time  . acetaminophen (TYLENOL) 500 MG tablet Take 500 mg by mouth every 6 (six) hours as needed for mild pain.   Past Month at Unknown time  . albuterol (PROVENTIL HFA;VENTOLIN HFA) 108 (90 Base) MCG/ACT inhaler Inhale 1-2 puffs into the lungs every 6 (six) hours as needed for wheezing or shortness of breath. 1 Inhaler 0 Past Week at Unknown time  . cephALEXin (KEFLEX) 500 MG capsule Take 1 capsule (500 mg total) by mouth 4 (four) times daily. 20 capsule 0     Review of Systems - Negative except as mentioned in HPI  Vitals:  BP (!) 119/54 (BP Location: Left Arm)   Pulse (!) 105    Temp 97.8 F (36.6 C) (Oral)   Resp 18   Ht 5\' 4"  (1.626 m)   Wt 111.6 kg   LMP 08/03/2017   SpO2 99%   BMI 42.23 kg/m  Physical Examination: CONSTITUTIONAL: Well-developed, well-nourished female in no acute distress.  HENT:  Normocephalic, atraumatic, External right and left ear normal. Oropharynx is clear and moist EYES: Conjunctivae and EOM are normal. Pupils are equal, round, and reactive to light. No scleral icterus.  NECK: Normal range of motion, supple, no masses SKIN: Skin is warm and dry. No rash noted. Not diaphoretic. No erythema. No pallor. [place]: Alert and oriented to person, place, and time. Normal reflexes, muscle tone coordination. No cranial nerve deficit noted. PSYCHIATRIC: Normal mood and affect. Normal behavior. Normal judgment and thought content. CARDIOVASCULAR: Normal heart rate noted, regular rhythm RESPIRATORY: Effort and breath sounds normal, no problems with respiration noted ABDOMEN: Soft, nontender, nondistended, gravid. MUSCULOSKELETAL: Normal range of motion. No edema and no tenderness. 2+ distal pulses.  Cervix: Not evaluated.  Membranes:ruptured, clear fluid Fetal Monitoring:Baseline: 130s bpm, Variability: Good {> 6 bpm), Accelerations: Reactive and Decelerations: Absent Tocometer: Flat  Labs:  Results for orders placed or performed during the hospital encounter of 03/08/18 (from the past 24 hour(s))  Amnisure rupture of membrane (rom)not at [place] Time: 03/09/18  3:20 AM  Result Value Ref Range   Amnisure ROM NEGATIVE   Type and screen [place]   Collection Time: 03/09/18  5:14 AM  Result Value Ref Range   ABO/RH(D) O POS    Antibody Screen NEG    Sample Expiration      03/12/2018 Performed at [facility], [address], [place], [place] [address]   CBC on admission   Collection Time: 03/09/18  5:17 AM  Result Value Ref Range   WBC 14.8 (H) 4.0 - 10.5 K/uL   RBC 3.53 (L) 3.87 - 5.11 MIL/uL    Hemoglobin 11.4 (L) 12.0 - 15.0 g/dL   HCT 34.4 (L) 36.0 - 46.0 %   MCV 97.5 80.0 - 100.0 fL   MCH 32.3 26.0 - 34.0 pg   MCHC 33.1 30.0 - 36.0 g/dL   RDW 14.1 11.5 - 15.5 %   Platelets 263 150 - 400 K/uL   nRBC 0.0 0.0 - 0.2 %  Fern Test   Collection Time: 03/09/18  5:35 AM  Result Value Ref Range   POCT Fern Test Positive = ruptured amniotic membanes (A)     Imaging Studies: No  results found.   Assessment and Plan: Patient Active Problem List   Diagnosis Date Noted  . Preterm premature rupture of membranes (PPROM) with unknown onset of labor 03/09/2018  . Inadequate social support FOB died 20-Nov-2017 01/05/18  . CIN III (cervical intraepithelial neoplasia grade III) with severe dysplasia 2018/01/05  . Supervision of normal first pregnancy 10/03/2017  . Glucose intolerance (impaired glucose tolerance) 12/21/2012  . PCOS (polycystic ovarian syndrome) 12/21/2012  . Prediabetes 10/26/2012  . Hyperglycemia 09/24/2012  . Tobacco use 09/17/2012   Admit to Antenatal Betamethasone x 2 doses Ampicillin+Azithromycin GBS culture collected Will recheck presentation if she does progress in preterm labor to determine route of delivery Routine antenatal care Plan to induce at 34 weeks unless otherwise indicated  Aura Camps, MD [place], [place]

## 2018-03-09 NOTE — Plan of Care (Signed)
  Problem: Education: Goal: Knowledge of disease or condition will improve Outcome: Progressing   Problem: Education: Goal: Knowledge of the prescribed therapeutic regimen will improve Outcome: Progressing   Problem: Coping: Goal: Ability to identify and develop effective coping behavior will improve Outcome: Progressing   Problem: Coping: Goal: Participation in decision-making will improve Outcome: Progressing   Problem: Physical Regulation: Goal: Complications related to the disease process, condition or treatment will be avoided or minimized Outcome: Progressing   Problem: Pain Management: Goal: Relief or control of pain will improve Outcome: Progressing

## 2018-03-09 NOTE — Progress Notes (Signed)
VISUALLY CLOSED

## 2018-03-09 NOTE — MAU Provider Note (Signed)
Chief Complaint:  Pregnant-fluid leaking   First Provider Initiated Contact with Patient 03/09/18 [phone]     HPI: Gina Snow is a 28 y.o. G1P0 at 50w3dwho presents via [place] from [place]  to maternity admissions reporting gush of fluid at work at [employer].  Partially soaked clothes.  . She reports good fetal movement, denies vaginal bleeding, vaginal itching/burning, urinary symptoms, h/a, dizziness, n/v, diarrhea, constipation or fever/chills.    No exam was done at [place]. Gets prenatal care at [facility] and has been uneventful except for death of her FOB in 12-18-2022. .   Vaginal Discharge  The patient's primary symptoms include vaginal discharge. The patient's pertinent negatives include no genital itching, genital lesions, genital odor, pelvic pain or vaginal bleeding. This is a new problem. The current episode started today. The problem occurs intermittently. The patient is experiencing no pain. She is pregnant. Pertinent negatives include no abdominal pain, back pain, chills, fever, frequency, headaches, nausea or vomiting. The vaginal discharge was clear and watery. There has been no bleeding. She has not been passing clots. She has not been passing tissue. Nothing aggravates the symptoms. She has tried nothing for the symptoms.     Past Medical History: Past Medical History:  Diagnosis Date  . Asthma   . Obesity   . PCO (polycystic ovaries)     Past obstetric history: OB History  Gravida Para Term Preterm AB Living  1            SAB TAB Ectopic Multiple Live Births               # Outcome Date GA Lbr Len/2nd Weight Sex Delivery Anes PTL Lv  1 Current             Past Surgical History: Past Surgical History:  Procedure Laterality Date  . NO PAST SURGERIES      Family History: Family History  Problem Relation Age of Onset  . Clotting disorder Mother 47       had "brain" clot?  Marland Kitchen Hypertension Maternal Grandmother   . Diabetes Maternal  Grandmother   . Thyroid disease Maternal Grandmother   . Hypertension Maternal Grandfather   . Diabetes Paternal Grandmother     Social History: Social History   Tobacco Use  . Smoking status: Former Smoker    Packs/day: 0.25    Years: 8.00    Pack years: 2.00    Types: Cigarettes  . Smokeless tobacco: Never Used  . Tobacco comment: 2-3 per day  Substance Use Topics  . Alcohol use: Not Currently    Comment: every once in a while  . Drug use: No    Allergies: No Known Allergies  Meds:  Medications Prior to Admission  Medication Sig Dispense Refill Last Dose  . Prenat w/o A-FeCbGl-DSS-FA-DHA (CITRANATAL ASSURE) 35-1 & 300 MG tablet One tablet and one capsule daily (Patient taking differently: Take 2 tablets by mouth daily. ) 60 tablet 11 03/09/2018 at Unknown time  . acetaminophen (TYLENOL) 500 MG tablet Take 500 mg by mouth every 6 (six) hours as needed for mild pain.   Past Month at Unknown time  . albuterol (PROVENTIL HFA;VENTOLIN HFA) 108 (90 Base) MCG/ACT inhaler Inhale 1-2 puffs into the lungs every 6 (six) hours as needed for wheezing or shortness of breath. 1 Inhaler 0 Past Week at Unknown time  . cephALEXin (KEFLEX) 500 MG capsule Take 1 capsule (500 mg total) by mouth 4 (four) times daily.  20 capsule 0     I have reviewed patient's Past Medical Hx, Surgical Hx, Family Hx, Social Hx, medications and allergies.   ROS:  Review of Systems  Constitutional: Negative for chills and fever.  Gastrointestinal: Negative for abdominal pain, nausea and vomiting.  Genitourinary: Positive for vaginal discharge. Negative for frequency and pelvic pain.  Musculoskeletal: Negative for back pain.  Neurological: Negative for headaches.   Other systems negative  Physical Exam   Patient Vitals for the past 24 hrs:  BP Temp Temp src Pulse Resp SpO2 Height Weight  03/09/18 0225 (!) 126/51 98.3 F (36.8 C) Oral 85 18 99 % - -  03/09/18 0130 (!) 116/49 - - 84 17 98 % - -  03/09/18  0100 110/60 - - 85 17 98 % - -  03/09/18 0010 (!) 111/49 - - 86 17 100 % - -  03/08/18 2230 126/68 - - 90 17 99 % - -  03/08/18 2224 139/65 98.3 F (36.8 C) Oral 99 16 99 % - -  03/08/18 2221 - - - - - - 5\' 4"  (1.626 m) 111.6 kg   Constitutional: Well-developed, well-nourished female in no acute distress.  Cardiovascular: normal rate and rhythm Respiratory: normal effort, clear to auscultation bilaterally GI: Abd soft, non-tender, gravid appropriate for gestational age.   No rebound or guarding. MS: Extremities nontender, no edema, normal ROM Neurologic: Alert and oriented x 4.  GU: Neg CVAT.  PELVIC EXAM: Cervix pink, visually closed, without lesion, scant clear discharge, vaginal walls and external genitalia normal  No pooling but immediately ferned  FHT:  Baseline 140 , moderate variability, accelerations present, no decelerations Contractions:  Irregular, mild   Labs: O/Positive/-- (08/20 1529) Results for orders placed or performed during the hospital encounter of 03/08/18 (from the past 24 hour(s))  Amnisure rupture of membrane (rom)not at [facility]     Status: None   Collection Time: 03/09/18  3:20 AM  Result Value Ref Range   Amnisure ROM NEGATIVE     Imaging:  No results found.  MAU Course/MDM: I have ordered labs and reviewed results.  On initial admission, since her perineum was dry, I elected to proceed with Amnisure.  It resulted as negative.  We monitored her for a while and prepared to discharge her.  As she was leaving she had another gush and it caused a 9cm wet spot on her clothes  So we reexamined her and did not see any pooling.  Vagina was glistening but no pooling.  I collected fluid and it ferned immediately within minutes.    NST reviewed, reactive  Assessment: 1. Abnormal vaginal fluids   2.    Preterm Premature Rupture of membranes at [redacted]w[redacted]d   Plan: Admit to Antenatal Routine orders Latency antibiotics Betamethasone series May get [place] for  measurements and AFI MD to follow  Hansel Feinstein CNM, MSN Certified Nurse-Midwife 03/09/2018 3:18 AM

## 2018-03-10 DIAGNOSIS — N898 Other specified noninflammatory disorders of vagina: Secondary | ICD-10-CM

## 2018-03-10 DIAGNOSIS — O26893 Other specified pregnancy related conditions, third trimester: Secondary | ICD-10-CM

## 2018-03-10 DIAGNOSIS — O42913 Preterm premature rupture of membranes, unspecified as to length of time between rupture and onset of labor, third trimester: Principal | ICD-10-CM

## 2018-03-10 LAB — CULTURE, BETA STREP (GROUP B ONLY)

## 2018-03-10 NOTE — Progress Notes (Signed)
Patient ID: Gina Snow, female   DOB: 16-Jun-1990, 28 y.o.   MRN: [id number] [place]) NOTE  Gina Snow is a 28 y.o. G1P0 at [redacted]w[redacted]d who is admitted for PROM.    Fetal presentation is cephalic. Length of Stay:  1  Days  Date of admission:03/08/2018  Subjective: Patient reports feeling well and persistent leakage of clear fluid intermittently Patient reports the fetal movement as active. Patient reports uterine contraction  activity as none. Patient reports  vaginal bleeding as none. Patient describes fluid per vagina as Clear.  Vitals:  Blood pressure 127/65, pulse 90, temperature 98.6 F (37 C), temperature source Oral, resp. rate 19, height 5\' 4"  (1.626 m), weight 111.6 kg, last menstrual period 08/03/2017, SpO2 99 %. Vitals:   03/09/18 1526 03/09/18 1920 03/09/18 2304 03/10/18 0355  BP: (!) 127/56 (!) 142/66 (!) 119/52 127/65  Pulse: 89 94 96 90  Resp: 18 19 20 19   Temp: 97.7 F (36.5 C) 97.9 F (36.6 C) 98.2 F (36.8 C) 98.6 F (37 C)  TempSrc: Oral Oral Oral Oral  SpO2: 99% 97% 100% 99%  Weight:      Height:       Physical Examination: GENERAL: Well-developed, well-nourished female in no acute distress.  LUNGS: Clear to auscultation bilaterally.  HEART: Regular rate and rhythm. ABDOMEN: Soft, nontender, gravid PELVIC: Not indicated EXTREMITIES: No cyanosis, clubbing, or edema, 2+ distal pulses.   Fetal Monitoring:  Baseline: 145 bpm, Variability: Good {> 6 bpm), Accelerations: Reactive and Decelerations: Absent   reactive Toco: no contractions  Labs:  No results found for this or any previous visit (from the past 24 hour(s)).  Imaging Studies:    [place] Mfm Fetal Bpp Wo Non Stress  Result Date: 03/09/2018 ----------------------------------------------------------------------  OBSTETRICS REPORT                       (Signed Final 03/09/2018 10:50 am) ----------------------------------------------------------------------  Patient Info  ID #:       [id number]                          D.O.B.:  Nov 01, 1990 (28 yrs)  Name:       Gina Snow               Visit Date: 03/09/2018 08:12 am ---------------------------------------------------------------------- Performed By  Performed By:     Wilnette Kales        Referred By:      3[facility] Nursing- [facility]                    RDMS,RVT                                                             3rd Floor  Attending:        Tama High MD        Location:         [facility] ---------------------------------------------------------------------- Orders   #  Description                          Code         Ordered By   1  [place] MFM OB DETAIL +  [employer]              76811.01     CAROLYN                                                        HARRAWAY-SMITH   2  [place] MFM FETAL BPP WO NON              76819.01     CAROLYN      STRESS                                            HARRAWAY-SMITH  ----------------------------------------------------------------------   #  Order #                    Accession #                 Episode #   1  [id number]                  [id number]                  [id number]   2  [id number]                  [id number]                  [id number]  ---------------------------------------------------------------------- Indications   Premature rupture of membranes - leaking       O42.90   fluid   [redacted] weeks gestation of pregnancy                Z3A.33  ---------------------------------------------------------------------- Fetal Evaluation  Num Of Fetuses:         1  Fetal Heart Rate(bpm):  132  Cardiac Activity:       Observed  Presentation:           Cephalic  Placenta:               Anterior  P. Cord Insertion:      Visualized, central  Amniotic Fluid  AFI FV:      Within normal limits  AFI Sum(cm)     %Tile       Largest Pocket(cm)  11.76           31          4.64  RUQ(cm)       RLQ(cm)       LUQ(cm)        LLQ(cm)  1.59          4.64          2.63           2.9  ---------------------------------------------------------------------- Biophysical Evaluation  Amniotic F.V:   Pocket => 2 cm two         F. Tone:        Observed                  planes  F. Movement:    Observed                   Score:          8/8  F. Breathing:   Observed ---------------------------------------------------------------------- Biometry  BPD:      82.9  mm     G. Age:  33w 3d         42  %    CI:        69.55   %    70 - 86                                                          FL/HC:      21.4   %    19.9 - 21.5  HC:      317.3  mm     G. Age:  35w 5d         72  %    HC/AC:      1.08        0.96 - 1.11  AC:      292.5  mm     G. Age:  33w 2d         47  %    FL/BPD:     82.0   %    71 - 87  FL:         68  mm     G. Age:  34w 6d         78  %    FL/AC:      23.2   %    20 - 24  CER:      44.8  mm     G. Age:  38w 6d       > 95  %  CM:        5.5  mm  Est. FW:    2324  gm      5 lb 2 oz     70  % ---------------------------------------------------------------------- Gestational Age  LMP:           33w 3d        Date:  07/18/17                 EDD:   04/24/18  U/S Today:     34w 2d                                        EDD:   04/18/18  Best:          33w 3d     Det. By:  LMP  (07/18/17)          EDD:   04/24/18 ---------------------------------------------------------------------- Anatomy  Cranium:               Appears normal         LVOT:                   Appears normal  Cavum:                 Appears normal         Aortic Arch:            Appears normal  Ventricles:            Appears normal         Ductal Arch:  Appears normal  Choroid Plexus:        Appears normal         Diaphragm:              Appears normal  Cerebellum:            Appears normal         Stomach:                Appears normal, left                                                                        sided  Posterior Fossa:       Appears normal         Abdomen:                Appears normal  Nuchal Fold:            Not applicable (>24    Abdominal Wall:         Appears nml (cord                         wks GA)                                        insert, abd wall)  Face:                  Appears normal         Cord Vessels:           Appears normal (3                         (orbits and profile)                           vessel cord)  Lips:                  Appears normal         Kidneys:                Appear normal  Palate:                Appears normal         Bladder:                Appears normal  Thoracic:              Appears normal         Spine:                  Not well visualized  Heart:                 Appears normal         Upper Extremities:      Appears normal                         (4CH, axis, and situs  RVOT:  Appears normal         Lower Extremities:      Appears normal  Other:  Fetus appears to be female. Hands and feet visualized. Open hands          and 5. finger not  visualized. ---------------------------------------------------------------------- Cervix Uterus Adnexa  Cervix  Not adaquately visualized ---------------------------------------------------------------------- Impression  Patient is admitted with diagnosis of PPROM. She is on  prophylactic antibiotics and received first dose of  betamethasone.  Fetal growth is appropriate for gestational age. Amniotic fluid  is normal and good fetal activity is seen. Cephalic  presentation. Fetal anatomy appears normal, but limited by  advanced gestational age. BPP 8/8. ---------------------------------------------------------------------- Recommendations  -Delivery at 34 weeks. ----------------------------------------------------------------------                  Tama High, MD Electronically Signed Final Report   03/09/2018 10:50 am ----------------------------------------------------------------------  [place] Mfm Ob Detail +14 Wk  Result Date: 03/09/2018 ----------------------------------------------------------------------   OBSTETRICS REPORT                       (Signed Final 03/09/2018 10:50 am) ---------------------------------------------------------------------- Patient Info  ID #:       [id number]                          D.O.B.:  [birth year] (27 yrs)  Name:       Gina Snow               Visit Date: 03/09/2018 08:12 am ---------------------------------------------------------------------- Performed By  Performed By:     Wilnette Kales        Referred By:      [facility]                    RDMS,RVT                                                             3rd Floor  Attending:        Tama High MD        Location:         [facility] ---------------------------------------------------------------------- Orders   #  Description                          Code         Ordered By   1  [place] MFM OB DETAIL +14 WK              76811.01     CAROLYN                                                        HARRAWAY-SMITH   2  [place] MFM FETAL BPP WO NON              71245.80     CAROLYN      STRESS  HARRAWAY-SMITH  ----------------------------------------------------------------------   #  Order #                    Accession #                 Episode #   1  [id number]                  [id number]                  [id number]   2  [id number]                  [id number]                  [id number]  ---------------------------------------------------------------------- Indications   Premature rupture of membranes - leaking       O42.90   fluid   [redacted] weeks gestation of pregnancy                Z3A.33  ---------------------------------------------------------------------- Fetal Evaluation  Num Of Fetuses:         1  Fetal Heart Rate(bpm):  132  Cardiac Activity:       Observed  Presentation:           Cephalic  Placenta:               Anterior  P. Cord Insertion:      Visualized, central  Amniotic Fluid  AFI FV:      Within normal limits  AFI Sum(cm)     %Tile       Largest Pocket(cm)  11.76            31          4.64  RUQ(cm)       RLQ(cm)       LUQ(cm)        LLQ(cm)  1.59          4.64          2.63           2.9 ---------------------------------------------------------------------- Biophysical Evaluation  Amniotic F.V:   Pocket => 2 cm two         F. Tone:        Observed                  planes  F. Movement:    Observed                   Score:          8/8  F. Breathing:   Observed ---------------------------------------------------------------------- Biometry  BPD:      82.9  mm     G. Age:  33w 3d         42  %    CI:        69.55   %    70 - 86                                                          FL/HC:      21.4   %    19.9 - 21.5  HC:      317.3  mm     G. Age:  35w 5d  72  %    HC/AC:      1.08        0.96 - 1.11  AC:      292.5  mm     G. Age:  33w 2d         47  %    FL/BPD:     82.0   %    71 - 87  FL:         68  mm     G. Age:  34w 6d         78  %    FL/AC:      23.2   %    20 - 24  CER:      44.8  mm     G. Age:  38w 6d       > 95  %  CM:        5.5  mm  Est. FW:    2324  gm      5 lb 2 oz     70  % ---------------------------------------------------------------------- Gestational Age  LMP:           33w 3d        Date:  07/18/17                 EDD:   04/24/18  U/S Today:     34w 2d                                        EDD:   04/18/18  Best:          33w 3d     Det. By:  LMP  (07/18/17)          EDD:   04/24/18 ---------------------------------------------------------------------- Anatomy  Cranium:               Appears normal         LVOT:                   Appears normal  Cavum:                 Appears normal         Aortic Arch:            Appears normal  Ventricles:            Appears normal         Ductal Arch:            Appears normal  Choroid Plexus:        Appears normal         Diaphragm:              Appears normal  Cerebellum:            Appears normal         Stomach:                Appears normal, left  sided  Posterior Fossa:       Appears normal         Abdomen:                Appears normal  Nuchal Fold:           Not applicable (>81    Abdominal Wall:         Appears nml (cord                         wks GA)                                        insert, abd wall)  Face:                  Appears normal         Cord Vessels:           Appears normal (3                         (orbits and profile)                           vessel cord)  Lips:                  Appears normal         Kidneys:                Appear normal  Palate:                Appears normal         Bladder:                Appears normal  Thoracic:              Appears normal         Spine:                  Not well visualized  Heart:                 Appears normal         Upper Extremities:      Appears normal                         (4CH, axis, and situs  RVOT:                  Appears normal         Lower Extremities:      Appears normal  Other:  Fetus appears to be female. Hands and feet visualized. Open hands          and 5. finger not  visualized. ---------------------------------------------------------------------- Cervix Uterus Adnexa  Cervix  Not adaquately visualized ---------------------------------------------------------------------- Impression  Patient is admitted with diagnosis of PPROM. She is on  prophylactic antibiotics and received first dose of  betamethasone.  Fetal growth is appropriate for gestational age. Amniotic fluid  is normal and good fetal activity is seen. Cephalic  presentation. Fetal anatomy appears normal, but limited by  advanced gestational age. BPP 8/8. ---------------------------------------------------------------------- Recommendations  -Delivery at 34 weeks. ----------------------------------------------------------------------  Tama High, MD Electronically Signed Final Report   03/09/2018 10:50 am  ----------------------------------------------------------------------    Medications:  Scheduled . [START ON 03/11/2018] amoxicillin  500 mg Oral Q8H  . [START ON 03/11/2018] azithromycin  500 mg Oral Daily  . docusate sodium  100 mg Oral Daily  . prenatal multivitamin  1 tablet Oral Q1200   I have reviewed the patient's current medications.  ASSESSMENT: G1P0 109w4d with PPROM Patient Active Problem List   Diagnosis Date Noted  . Preterm premature rupture of membranes (PPROM) with unknown onset of labor 03/09/2018  . Inadequate social support FOB died December 17, 2017 01-Feb-2018  . CIN III (cervical intraepithelial neoplasia grade III) with severe dysplasia 02-01-18  . Supervision of normal first pregnancy 10/03/2017  . Glucose intolerance (impaired glucose tolerance) 12/21/2012  . PCOS (polycystic ovarian syndrome) 12/21/2012  . Prediabetes 10/26/2012  . Hyperglycemia 09/24/2012  . Tobacco use 09/17/2012    PLAN: Patient to complete BMZ today Continue latency antibiotics Continue monitoring for si/sx of chorioamnionitis Continue current antepartum care  Gina Snow 03/10/2018,7:24 AM

## 2018-03-11 DIAGNOSIS — O42919 Preterm premature rupture of membranes, unspecified as to length of time between rupture and onset of labor, unspecified trimester: Secondary | ICD-10-CM

## 2018-03-11 MED ORDER — ONDANSETRON HCL 4 MG PO TABS
4.0000 mg | ORAL_TABLET | Freq: Three times a day (TID) | ORAL | Status: DC | PRN
  Administered 2018-03-11: 4 mg via ORAL
  Filled 2018-03-11 (×2): qty 1

## 2018-03-11 NOTE — Progress Notes (Signed)
Patient ID: Gina Snow, female   DOB: Sep 25, 1990, 28 y.o.   MRN: [id number] [place]) NOTE  Gina Snow is a 28 y.o. G1P0 at [redacted]w[redacted]d by best clinical estimate who is admitted for rupture of membranes, PROM.   Fetal presentation is cephalic. Length of Stay:  2  Days  Subjective:  Patient reports the fetal movement as active. Patient reports uterine contraction  activity as none. Patient reports  vaginal bleeding as none. Patient describes fluid per vagina as Clear.  Vitals:  Blood pressure 112/61, pulse 95, temperature 98.8 F (37.1 C), temperature source Oral, resp. rate 17, height 5\' 4"  (1.626 m), weight 111.6 kg, last menstrual period 08/03/2017, SpO2 100 %. Physical Examination:  General appearance - alert, well appearing, and in no distress Heart - normal rate and regular rhythm Abdomen - soft, nontender, nondistended Fundal Height:  size equals dates Cervical Exam: Not evaluated.  Extremities: extremities normal, atraumatic, no cyanosis or edema and Homans sign is negative, no sign of DVT with DTRs 2+ bilaterally Membranes:ruptured  Fetal Monitoring:  Baseline: 145 bpm, Variability: Good {> 6 bpm), Accelerations: Reactive and Decelerations: Absent  Labs:  No results found for this or any previous visit (from the past 24 hour(s)).   Medications:  Scheduled . amoxicillin  500 mg Oral Q8H  . azithromycin  500 mg Oral Daily  . docusate sodium  100 mg Oral Daily  . prenatal multivitamin  1 tablet Oral Q1200   I have reviewed the patient's current medications.  ASSESSMENT: Patient Active Problem List   Diagnosis Date Noted  . Preterm premature rupture of membranes (PPROM) with unknown onset of labor 03/09/2018  . Inadequate social support FOB died 11-19-2017 January 04, 2018  . CIN III (cervical intraepithelial neoplasia grade III) with severe dysplasia 2018-01-04  . Supervision of normal first pregnancy 10/03/2017  . Glucose intolerance  (impaired glucose tolerance) 12/21/2012  . PCOS (polycystic ovarian syndrome) 12/21/2012  . Prediabetes 10/26/2012  . Hyperglycemia 09/24/2012  . Tobacco use 09/17/2012    PLAN: Continue latency antibiotics NST TID Anticipate delivery at 34 weeks  Emeterio Reeve 03/11/2018,5:49 AM

## 2018-03-12 LAB — TYPE AND SCREEN
ABO/RH(D): O POS
Antibody Screen: NEGATIVE

## 2018-03-12 LAB — GC/CHLAMYDIA PROBE AMP (~~LOC~~) NOT AT ARMC
Chlamydia: NEGATIVE
Neisseria Gonorrhea: NEGATIVE

## 2018-03-12 NOTE — Progress Notes (Signed)
Patient ID: Gina Snow, female   DOB: Dec 06, 1990, 28 y.o.   MRN: [id number] [place]) NOTE  Gina Snow is a 28 y.o. G1P0 at [redacted]w[redacted]d by best clinical estimate who is admitted for rupture of membranes, PROM.   Fetal presentation is cephalic. Length of Stay:  3  Days  Subjective: Patient is without complaints Patient reports the fetal movement as active. Patient reports uterine contraction  activity as none. Patient reports  vaginal bleeding as none. Patient describes fluid per vagina as Clear.  Vitals:  Blood pressure (!) 141/76, pulse 90, temperature 98.8 F (37.1 C), temperature source Oral, resp. rate 18, height 5\' 4"  (1.626 m), weight 111.6 kg, last menstrual period 08/03/2017, SpO2 100 %. Physical Examination: GENERAL: Well-developed, well-nourished female in no acute distress.  LUNGS: Clear to auscultation bilaterally.  HEART: Regular rate and rhythm. ABDOMEN: Soft, nontender, gravid EXTREMITIES: No cyanosis, clubbing, or edema, 2+ distal pulses.   Fetal Monitoring:  Baseline: 145 bpm, Variability: Good {> 6 bpm), Accelerations: Reactive and Decelerations: Variable: moderate  Labs:  Results for orders placed or performed during the hospital encounter of 03/08/18 (from the past 24 hour(s))  Type and screen [place]   Collection Time: 03/12/18  5:05 AM  Result Value Ref Range   ABO/RH(D) O POS    Antibody Screen NEG    Sample Expiration      03/15/2018 Performed at [facility], [address] [place], [place] [address]      Medications:  Scheduled . amoxicillin  500 mg Oral Q8H  . azithromycin  500 mg Oral Daily  . docusate sodium  100 mg Oral Daily  . prenatal multivitamin  1 tablet Oral Q1200   I have reviewed the patient's current medications.  ASSESSMENT: Patient Active Problem List   Diagnosis Date Noted  . Preterm premature rupture of membranes (PPROM) with unknown onset of labor  03/09/2018  . Inadequate social support FOB died 14-Dec-2017 January 29, 2018  . CIN III (cervical intraepithelial neoplasia grade III) with severe dysplasia 01/29/18  . Supervision of normal first pregnancy 10/03/2017  . Glucose intolerance (impaired glucose tolerance) 12/21/2012  . PCOS (polycystic ovarian syndrome) 12/21/2012  . Prediabetes 10/26/2012  . Hyperglycemia 09/24/2012  . Tobacco use 09/17/2012    PLAN: Patient completed BMZ Continue latency antibodies Continue monitoring for si/sx of chorioamnionitis Neonatology consult Patient will be transferred to birthing suites around midnight for IOL  [name] 03/12/2018,10:28 AM

## 2018-03-13 ENCOUNTER — Other Ambulatory Visit: Payer: Self-pay

## 2018-03-13 ENCOUNTER — Encounter (HOSPITAL_COMMUNITY): Payer: Self-pay

## 2018-03-13 ENCOUNTER — Inpatient Hospital Stay (HOSPITAL_COMMUNITY): Payer: Medicaid Other | Admitting: Anesthesiology

## 2018-03-13 LAB — CBC
HCT: 34.3 % — ABNORMAL LOW (ref 36.0–46.0)
Hemoglobin: 10.7 g/dL — ABNORMAL LOW (ref 12.0–15.0)
MCH: 32.3 pg (ref 26.0–34.0)
MCHC: 31.2 g/dL (ref 30.0–36.0)
MCV: 103.6 fL — ABNORMAL HIGH (ref 80.0–100.0)
Platelets: 215 10*3/uL (ref 150–400)
RBC: 3.31 MIL/uL — ABNORMAL LOW (ref 3.87–5.11)
RDW: 15.1 % (ref 11.5–15.5)
WBC: 17.8 10*3/uL — ABNORMAL HIGH (ref 4.0–10.5)

## 2018-03-13 LAB — RPR: RPR Ser Ql: NONREACTIVE

## 2018-03-13 MED ORDER — SODIUM CHLORIDE 0.9 % IV SOLN
5.0000 10*6.[IU] | Freq: Once | INTRAVENOUS | Status: AC
  Administered 2018-03-13: 5 10*6.[IU] via INTRAVENOUS
  Filled 2018-03-13: qty 5

## 2018-03-13 MED ORDER — PHENYLEPHRINE 40 MCG/ML (10ML) SYRINGE FOR IV PUSH (FOR BLOOD PRESSURE SUPPORT): 80.0000 ug | PREFILLED_SYRINGE | INTRAVENOUS | Status: DC | PRN

## 2018-03-13 MED ORDER — LIDOCAINE HCL (PF) 1 % IJ SOLN
30.0000 mL | INTRAMUSCULAR | Status: DC | PRN
  Filled 2018-03-13: qty 30

## 2018-03-13 MED ORDER — MISOPROSTOL 25 MCG QUARTER TABLET: 25.0000 ug | ORAL_TABLET | ORAL | Status: DC | PRN

## 2018-03-13 MED ORDER — LIDOCAINE HCL (PF) 1 % IJ SOLN
INTRAMUSCULAR | Status: DC | PRN
  Administered 2018-03-13 (×2): 7 mL via EPIDURAL

## 2018-03-13 MED ORDER — EPHEDRINE 5 MG/ML INJ: 10.0000 mg | INTRAVENOUS | Status: DC | PRN

## 2018-03-13 MED ORDER — FLEET ENEMA 7-19 GM/118ML RE ENEM: 1.0000 | ENEMA | RECTAL | Status: DC | PRN

## 2018-03-13 MED ORDER — FENTANYL CITRATE (PF) 100 MCG/2ML IJ SOLN
100.0000 ug | INTRAMUSCULAR | Status: DC | PRN
  Administered 2018-03-13: 100 ug via INTRAVENOUS
  Filled 2018-03-13: qty 2

## 2018-03-13 MED ORDER — MISOPROSTOL 50MCG HALF TABLET
50.0000 ug | ORAL_TABLET | ORAL | Status: DC | PRN
  Administered 2018-03-13 (×4): 50 ug via BUCCAL
  Filled 2018-03-13 (×5): qty 1

## 2018-03-13 MED ORDER — ONDANSETRON HCL 4 MG/2ML IJ SOLN: 4.0000 mg | Freq: Four times a day (QID) | INTRAMUSCULAR | Status: DC | PRN

## 2018-03-13 MED ORDER — FENTANYL 2.5 MCG/ML BUPIVACAINE 1/10 % EPIDURAL INFUSION (WH - ANES)
14.0000 mL/h | INTRAMUSCULAR | Status: DC | PRN
  Administered 2018-03-13 – 2018-03-14 (×2): 14 mL/h via EPIDURAL
  Filled 2018-03-13 (×2): qty 100

## 2018-03-13 MED ORDER — LACTATED RINGERS IV SOLN
500.0000 mL | Freq: Once | INTRAVENOUS | Status: AC
  Administered 2018-03-13: 500 mL via INTRAVENOUS

## 2018-03-13 MED ORDER — OXYTOCIN BOLUS FROM INFUSION: 500.0000 mL | Freq: Once | INTRAVENOUS | Status: DC

## 2018-03-13 MED ORDER — TERBUTALINE SULFATE 1 MG/ML IJ SOLN: 0.2500 mg | Freq: Once | INTRAMUSCULAR | Status: DC | PRN

## 2018-03-13 MED ORDER — PHENYLEPHRINE 40 MCG/ML (10ML) SYRINGE FOR IV PUSH (FOR BLOOD PRESSURE SUPPORT)
80.0000 ug | PREFILLED_SYRINGE | INTRAVENOUS | Status: DC | PRN
  Filled 2018-03-13: qty 10

## 2018-03-13 MED ORDER — LACTATED RINGERS IV SOLN
500.0000 mL | INTRAVENOUS | Status: DC | PRN
  Administered 2018-03-13 – 2018-03-14 (×4): 500 mL via INTRAVENOUS

## 2018-03-13 MED ORDER — LACTATED RINGERS IV SOLN
INTRAVENOUS | Status: DC
  Administered 2018-03-13 – 2018-03-14 (×4): via INTRAVENOUS

## 2018-03-13 MED ORDER — SOD CITRATE-CITRIC ACID 500-334 MG/5ML PO SOLN: 30.0000 mL | ORAL | Status: DC | PRN

## 2018-03-13 MED ORDER — PENICILLIN G 3 MILLION UNITS IVPB - SIMPLE MED
3.0000 10*6.[IU] | INTRAVENOUS | Status: DC
  Administered 2018-03-13 – 2018-03-14 (×7): 3 10*6.[IU] via INTRAVENOUS
  Filled 2018-03-13 (×8): qty 100

## 2018-03-13 MED ORDER — DIPHENHYDRAMINE HCL 50 MG/ML IJ SOLN: 12.5000 mg | INTRAMUSCULAR | Status: DC | PRN

## 2018-03-13 MED ORDER — OXYTOCIN 40 UNITS IN NORMAL SALINE INFUSION - SIMPLE MED
2.5000 [IU]/h | INTRAVENOUS | Status: DC
  Administered 2018-03-14: 39.96 [IU]/h via INTRAVENOUS
  Filled 2018-03-13: qty 1000

## 2018-03-13 NOTE — Progress Notes (Signed)
OB/GYN Faculty Practice: Labor Progress Note  Subjective: Doing well. No complaints. Transferred from antepartum at midnight to start induction for PPROM. Feeling lots of fetal movement. Still noticing clear fluids leaking.   Objective: BP (!) 119/56   Pulse 82   Temp 98.4 F (36.9 C) (Oral)   Resp 16   Ht 5\' 4"  (1.626 m)   Wt 111.6 kg   LMP 08/03/2017   SpO2 99%   BMI 42.23 kg/m  Gen: well-appearing, NAD Dilation: Closed Effacement (%): Thick Station: [place], -3 Presentation: Vertex Exam by:: Denyse Dago, RN  Assessment and Plan: 28 y.o. G1P0 [redacted]w[redacted]d here for IOL for PPROM.   Labor: Induction to start with buccal cytotec. Will try to place FB once cervix opened some. PPROM 1/23.  -- pain control: desires epidural -- PPH Risk: low  Fetal Well-Being: EFW 5-6lbs by Leopold's. Cephalic by sutures per RN.  -- Category I - continuous fetal monitoring  -- GBS positive - PCN    Laurel S. Juleen China, DO OB/GYN Fellow, Faculty Practice  1:39 AM

## 2018-03-13 NOTE — Anesthesia Preprocedure Evaluation (Signed)
Anesthesia Evaluation  Patient identified by MRN, date of birth, ID band Patient awake    Reviewed: Allergy & Precautions, H&P , NPO status , Patient's Chart, lab work & pertinent test results  Airway Mallampati: II  TM Distance: >3 FB Neck ROM: full    Dental no notable dental hx. (+) Teeth Intact   Pulmonary Current Smoker,    Pulmonary exam normal breath sounds clear to auscultation       Cardiovascular negative cardio ROS Normal cardiovascular exam Rhythm:regular Rate:Normal     Neuro/Psych negative neurological ROS  negative psych ROS   GI/Hepatic negative GI ROS, Neg liver ROS,   Endo/Other  Morbid obesity  Renal/GU negative Renal ROS  negative genitourinary   Musculoskeletal negative musculoskeletal ROS (+)   Abdominal (+) + obese,   Peds  Hematology negative hematology ROS (+)   Anesthesia Other Findings   Reproductive/Obstetrics (+) Pregnancy                             Anesthesia Physical Anesthesia Plan  ASA: III  Anesthesia Plan: Epidural   Post-op Pain Management:    Induction:   PONV Risk Score and Plan:   Airway Management Planned:   Additional Equipment:   Intra-op Plan:   Post-operative Plan:   Informed Consent: I have reviewed the patients History and Physical, chart, labs and discussed the procedure including the risks, benefits and alternatives for the proposed anesthesia with the patient or authorized representative who has indicated his/her understanding and acceptance.       Plan Discussed with:   Anesthesia Plan Comments:         Anesthesia Quick Evaluation

## 2018-03-13 NOTE — Progress Notes (Signed)
Patient ID: Gina Snow, female   DOB: 04/12/1990, 28 y.o.   MRN: [id number] Gina Snow is a 28 y.o. G1P0 at 104w0d admitted for induction of labor due to PPROM.  Subjective: Comfortable, no complaints  Objective: BP 121/77   Pulse 77   Temp 98.7 F (37.1 C) (Oral)   Resp 16   Ht 5\' 4"  (1.626 m)   Wt 111.6 kg   LMP 08/03/2017   SpO2 100%   BMI 42.23 kg/m  Total I/O In: -  Out: 2000 [Urine:2000]  FHT:  FHR: 145 bpm, variability: moderate,  accelerations:  Present,  decelerations:  Present occ variable UC:   q 2-81mins  SVE:   Dilation: 6 Effacement (%): 80 Station: -2, -1 Exam by:: s seagraves rn  Labs: Lab Results  Component Value Date   WBC 17.8 (H) 03/12/2018   HGB 10.7 (L) 03/12/2018   HCT 34.3 (L) 03/12/2018   MCV 103.6 (H) 03/12/2018   PLT 215 03/12/2018    Assessment / Plan: IOL d/t PPROM, s/p cytotec and foley bulb, progressing on her own. Will monitor for need for pitocin augmentation  Labor: Progressing normally Fetal Wellbeing:  Category II Pain Control:  Epidural Pre-eclampsia: n/a I/D:  PCN for GBS+ Anticipated MOD:  NSVD  Roma Schanz CNM, WHNP-BC 03/13/2018, 10:44 PM

## 2018-03-13 NOTE — Anesthesia Pain Management Evaluation Note (Signed)
  CRNA Pain Management Visit Note  Patient: Gina Snow, 28 y.o., female  "Hello I am a member of the anesthesia team at [facility]. We have an anesthesia team available at all times to provide care throughout the hospital, including epidural management and anesthesia for C-section. I don't know your plan for the delivery whether it a natural birth, water birth, IV sedation, nitrous supplementation, doula or epidural, but we want to meet your pain goals."   1.Was your pain managed to your expectations on prior hospitalizations?   No prior hospitalizations  2.What is your expectation for pain management during this hospitalization?     Epidural, IV pain meds and Nitrous Oxide  3.How can we help you reach that goal? Be available  Record the patient's initial score and the patient's pain goal.   Pain: 6  Pain Goal: 9 [facility] wants you to be able to say your pain was always managed very well.  [facility] 03/13/2018

## 2018-03-13 NOTE — Progress Notes (Signed)
OB/GYN Faculty Practice: Labor Progress Note  Subjective: Has been sleeping. Feeling some discomfort but no regular contractions. Still notices fluid leaking.   Objective: BP 123/64   Pulse 75   Temp 98.3 F (36.8 C) (Oral)   Resp 17   Ht 5\' 4"  (1.626 m)   Wt 111.6 kg   LMP 08/03/2017   SpO2 99%   BMI 42.23 kg/m  Gen: tired-appearing  Dilation: Closed Effacement (%): Thick Station: Morning Sun, -3 Presentation: Vertex(confirmed by [place] ) Exam by:: Dr. Juleen China  Assessment and Plan: 28 y.o. G1P0 [redacted]w[redacted]d here for IOL for PPROM.   Labor:  PPROM 1/23. SVE with cervix remaining closed. Will given 2nd dose of buccal cytotec. If minimal interval change with next check, will place FB with speculum.  -- pain control: desires epidural -- PPH Risk: low  Fetal Well-Being: EFW 5-6lbs by Leopold's. Cephalic by BSUS.  -- Category I - continuous fetal monitoring  -- GBS positive - PCN    Laurel S. Juleen China, DO OB/GYN Fellow, Faculty Practice  5:37 AM

## 2018-03-13 NOTE — Anesthesia Procedure Notes (Signed)
Epidural Patient location during procedure: OB Start time: 03/13/2018 7:25 PM End time: 03/13/2018 7:28 PM  Staffing Anesthesiologist: Lyn Hollingshead, MD Performed: anesthesiologist   Preanesthetic Checklist Completed: patient identified, site marked, surgical consent, pre-op evaluation, timeout performed, IV checked, risks and benefits discussed and monitors and equipment checked  Epidural Patient position: sitting Prep: site prepped and draped and DuraPrep Patient monitoring: continuous pulse ox and blood pressure Approach: midline Location: L3-L4 Injection technique: LOR air  Needle:  Needle type: Tuohy  Needle gauge: 17 G Needle length: 9 cm and 9 Needle insertion depth: 9 cm Catheter type: closed end flexible Catheter size: 19 Gauge Catheter at skin depth: 14 cm Test dose: negative and Other  Assessment Sensory level: T10 Events: blood not aspirated, injection not painful, no injection resistance, negative IV test and no paresthesia  Additional Notes Reason for block:procedure for pain

## 2018-03-13 NOTE — Consult Note (Signed)
Neonatology Antenatal Consultation: Requested by: Nehemiah Settle Reason: IOL for PPROM at 66 weeks  Dr. Nehemiah Settle asked me to see Ms. Denzer in order to explained to her realistic expectations for the typical course of a baby born preterm at 30 weeks.  I explained to the patient that babies born at 7 weeks without other complications would typically need a 2 or 3-week stay in the hospital in the intensive care nursery.  I explained that babies at 102 weeks needed monitoring because of the risk for apnea prematurity, as well as the need for tube feeding for at least a week.  She said that she understood, and that this has been explained to her by her providers.  I told her that if she had any further questions that she should call us back and we would be happy to discuss any further queries.  Despina Hidden MD

## 2018-03-13 NOTE — Progress Notes (Signed)
LABOR PROGRESS NOTE  Gina Snow is a 28 y.o. G1P0 at [redacted]w[redacted]d  admitted for IOL PPROM 1/23  Subjective: Doing well, coping well with cramping   Objective: BP 125/69   Pulse 87   Temp 97.8 F (36.6 C) (Oral)   Resp 16   Ht 5\' 4"  (1.626 m)   Wt 111.6 kg   LMP 08/03/2017   SpO2 99%   BMI 42.23 kg/m  or  Vitals:   03/13/18 0328 03/13/18 0550 03/13/18 0628 03/13/18 0855  BP: 123/64 126/77 (!) 113/51 125/69  Pulse: 75 75 78 87  Resp: 17 16    Temp:  98.2 F (36.8 C) 97.8 F (36.6 C)   TempSrc:  Oral Oral   SpO2:      Weight:      Height:        1030 Dilation: 1 Effacement (%): 50 Station: -3 Presentation: Vertex Exam by:: Cordel Drewes, SNM FHT: baseline rate 150, moderate varibility, +acel, no decel Toco: irregular every 3-7 minutes lasting 50-60 seconds   Labs: Lab Results  Component Value Date   WBC 17.8 (H) 03/12/2018   HGB 10.7 (L) 03/12/2018   HCT 34.3 (L) 03/12/2018   MCV 103.6 (H) 03/12/2018   PLT 215 03/12/2018    Patient Active Problem List   Diagnosis Date Noted  . Preterm premature rupture of membranes (PPROM) with unknown onset of labor 03/09/2018  . Inadequate social support FOB died 06-Dec-2017 Jan 21, 2018  . CIN III (cervical intraepithelial neoplasia grade III) with severe dysplasia 01/21/2018  . Supervision of normal first pregnancy 10/03/2017  . PCOS (polycystic ovarian syndrome) 12/21/2012  . Prediabetes 10/26/2012  . Hyperglycemia 09/24/2012  . Tobacco use 09/17/2012    Assessment / Plan: 27 y.o. G1P0 at [redacted]w[redacted]d here for IOL for PPROM   Labor: s/p cytotec X3 (0958), cervical foley placed, recheck in 4 hours and consider cyotec or pitocin.  GBS+, continue PCN  Fetal Wellbeing:  Category 1 Pain Control:  IV fentanyl for foley placement  Anticipated MOD:  SVD  Gina Snow, SNM  03/13/2018, 10:53 AM

## 2018-03-13 NOTE — Progress Notes (Signed)
LABOR PROGRESS NOTE  Gina Snow is a 28 y.o. G1P0 at [redacted]w[redacted]d  admitted for IOL PPROM   Subjective: Doing well, coping well  Objective: BP 133/79   Pulse 73   Temp 98.3 F (36.8 C) (Oral)   Resp 16   Ht 5\' 4"  (1.626 m)   Wt 111.6 kg   LMP 08/03/2017   SpO2 99%   BMI 42.23 kg/m  or  Vitals:   03/13/18 0855 03/13/18 1157 03/13/18 1255 03/13/18 1402  BP: 125/69 (!) 106/50 121/68 133/79  Pulse: 87 82 76 73  Resp:    16  Temp:  98.3 F (36.8 C)    TempSrc:  Oral    SpO2:      Weight:      Height:        1030 Dilation: 1 Effacement (%): 50 Station: -3 Presentation: Vertex(By [place] Dr. Juleen China) Exam by:: Nitara Szczerba, SNM FHT: baseline rate 140, moderate varibility, +acel, no decel Toco: irregular, every 2-5 minutes, lasting 60 seconds   Labs: Lab Results  Component Value Date   WBC 17.8 (H) 03/12/2018   HGB 10.7 (L) 03/12/2018   HCT 34.3 (L) 03/12/2018   MCV 103.6 (H) 03/12/2018   PLT 215 03/12/2018    Patient Active Problem List   Diagnosis Date Noted  . Preterm premature rupture of membranes (PPROM) with unknown onset of labor 03/09/2018  . Inadequate social support FOB died 12/17/17 02-01-18  . CIN III (cervical intraepithelial neoplasia grade III) with severe dysplasia 2018/02/01  . Supervision of normal first pregnancy 10/03/2017  . PCOS (polycystic ovarian syndrome) 12/21/2012  . Prediabetes 10/26/2012  . Hyperglycemia 09/24/2012  . Tobacco use 09/17/2012    Assessment / Plan: 28 y.o. G1P0 at [redacted]w[redacted]d here for IOL for PPROM   Labor:  S/p cytotec X4 (1420 last cytotec), recheck in 4 hours and consider cytotec vs. Pitocin  GBS+ continue pcn  Fetal Wellbeing:  Category 1  Pain Control:  IV fentanyl if needed Anticipated MOD:  SVD  Jolly Carlini, SNM  03/13/2018, 3:02 PM

## 2018-03-13 NOTE — Progress Notes (Signed)
LABOR PROGRESS NOTE  Gina Snow is a 28 y.o. G1P0 at [redacted]w[redacted]d  admitted for IOL PPROM  Subjective: Doing well. Just received epidural and resting comfortably   Objective: BP 126/65   Pulse 82   Temp 98.8 F (37.1 C) (Oral)   Resp 18   Ht 5\' 4"  (1.626 m)   Wt 111.6 kg   LMP 08/03/2017   SpO2 99%   BMI 42.23 kg/m  or  Vitals:   03/13/18 1402 03/13/18 1507 03/13/18 1836 03/13/18 1905  BP: 133/79 126/65    Pulse: 73 82    Resp: 16   18  Temp:  97.8 F (36.6 C) 98.8 F (37.1 C)   TempSrc:  Oral Oral   SpO2:      Weight:      Height:        1820 Dilation: 5 Effacement (%): 70 Station: -3 Presentation: Vertex Exam by:: NGEX, rn FHT: baseline rate 140, moderate varibility, no acel, no decel Toco: every 2-3 minutes lasting 50-60 seconds   Labs: Lab Results  Component Value Date   WBC 17.8 (H) 03/12/2018   HGB 10.7 (L) 03/12/2018   HCT 34.3 (L) 03/12/2018   MCV 103.6 (H) 03/12/2018   PLT 215 03/12/2018    Patient Active Problem List   Diagnosis Date Noted  . Preterm premature rupture of membranes (PPROM) with unknown onset of labor 03/09/2018  . Inadequate social support FOB died 12-06-17 01-21-2018  . CIN III (cervical intraepithelial neoplasia grade III) with severe dysplasia 01-21-2018  . Supervision of normal first pregnancy 10/03/2017  . PCOS (polycystic ovarian syndrome) 12/21/2012  . Prediabetes 10/26/2012  . Hyperglycemia 09/24/2012  . Tobacco use 09/17/2012    Assessment / Plan: 28 y.o. G1P0 at [redacted]w[redacted]d here for IOL for PPROM   Labor: s/p cytotec X4. Progressing as expected. Patient contracting every 2-3 minutes. Consider starting pitocin if spaces.  Fetal Wellbeing:  Category 1  Pain Control:  Epidural  Anticipated MOD:  SVD  Cova Knieriem, SNM  03/13/2018, 7:07 PM

## 2018-03-14 ENCOUNTER — Encounter: Payer: Medicaid Other | Admitting: Obstetrics and Gynecology

## 2018-03-14 ENCOUNTER — Telehealth: Payer: Self-pay | Admitting: *Deleted

## 2018-03-14 DIAGNOSIS — O42113 Preterm premature rupture of membranes, onset of labor more than 24 hours following rupture, third trimester: Secondary | ICD-10-CM

## 2018-03-14 DIAGNOSIS — O42919 Preterm premature rupture of membranes, unspecified as to length of time between rupture and onset of labor, unspecified trimester: Secondary | ICD-10-CM

## 2018-03-14 DIAGNOSIS — Z3A34 34 weeks gestation of pregnancy: Secondary | ICD-10-CM

## 2018-03-14 MED ORDER — ONDANSETRON HCL 4 MG/2ML IJ SOLN: 4.0000 mg | INTRAMUSCULAR | Status: DC | PRN

## 2018-03-14 MED ORDER — BENZOCAINE-MENTHOL 20-0.5 % EX AERO
1.0000 "application " | INHALATION_SPRAY | CUTANEOUS | Status: DC | PRN
  Administered 2018-03-14: 1 via TOPICAL
  Filled 2018-03-14: qty 56

## 2018-03-14 MED ORDER — TERBUTALINE SULFATE 1 MG/ML IJ SOLN: 0.2500 mg | Freq: Once | INTRAMUSCULAR | Status: DC | PRN

## 2018-03-14 MED ORDER — OXYTOCIN 40 UNITS IN NORMAL SALINE INFUSION - SIMPLE MED
1.0000 m[IU]/min | INTRAVENOUS | Status: DC
  Administered 2018-03-14: 2 m[IU]/min via INTRAVENOUS
  Filled 2018-03-14: qty 1000

## 2018-03-14 MED ORDER — BISACODYL 10 MG RE SUPP
10.0000 mg | Freq: Every day | RECTAL | Status: DC | PRN
  Filled 2018-03-14: qty 1

## 2018-03-14 MED ORDER — FLEET ENEMA 7-19 GM/118ML RE ENEM: 1.0000 | ENEMA | Freq: Every day | RECTAL | Status: DC | PRN

## 2018-03-14 MED ORDER — TETANUS-DIPHTH-ACELL PERTUSSIS 5-2.5-18.5 LF-MCG/0.5 IM SUSP: 0.5000 mL | Freq: Once | INTRAMUSCULAR | Status: DC

## 2018-03-14 MED ORDER — MEASLES, MUMPS & RUBELLA VAC IJ SOLR: 0.5000 mL | Freq: Once | INTRAMUSCULAR | Status: DC

## 2018-03-14 MED ORDER — ACETAMINOPHEN 325 MG PO TABS: 650.0000 mg | ORAL_TABLET | ORAL | Status: DC | PRN

## 2018-03-14 MED ORDER — DIPHENHYDRAMINE HCL 25 MG PO CAPS: 25.0000 mg | ORAL_CAPSULE | Freq: Four times a day (QID) | ORAL | Status: DC | PRN

## 2018-03-14 MED ORDER — SIMETHICONE 80 MG PO CHEW: 80.0000 mg | CHEWABLE_TABLET | ORAL | Status: DC | PRN

## 2018-03-14 MED ORDER — PNEUMOCOCCAL VAC POLYVALENT 25 MCG/0.5ML IJ INJ
0.5000 mL | INJECTION | INTRAMUSCULAR | Status: AC
  Administered 2018-03-15: 0.5 mL via INTRAMUSCULAR
  Filled 2018-03-14 (×2): qty 0.5

## 2018-03-14 MED ORDER — COCONUT OIL OIL: 1.0000 "application " | TOPICAL_OIL | Status: DC | PRN

## 2018-03-14 MED ORDER — WITCH HAZEL-GLYCERIN EX PADS: 1.0000 "application " | MEDICATED_PAD | CUTANEOUS | Status: DC | PRN

## 2018-03-14 MED ORDER — SENNOSIDES-DOCUSATE SODIUM 8.6-50 MG PO TABS
2.0000 | ORAL_TABLET | ORAL | Status: DC
  Administered 2018-03-14 – 2018-03-15 (×2): 2 via ORAL
  Filled 2018-03-14 (×2): qty 2

## 2018-03-14 MED ORDER — SODIUM CHLORIDE 0.9 % IV SOLN: 250.0000 mL | INTRAVENOUS | Status: DC | PRN

## 2018-03-14 MED ORDER — ZOLPIDEM TARTRATE 5 MG PO TABS: 5.0000 mg | ORAL_TABLET | Freq: Every evening | ORAL | Status: DC | PRN

## 2018-03-14 MED ORDER — SODIUM CHLORIDE 0.9% FLUSH: 3.0000 mL | INTRAVENOUS | Status: DC | PRN

## 2018-03-14 MED ORDER — ONDANSETRON HCL 4 MG PO TABS
4.0000 mg | ORAL_TABLET | ORAL | Status: DC | PRN
  Filled 2018-03-14: qty 1

## 2018-03-14 MED ORDER — IBUPROFEN 600 MG PO TABS
600.0000 mg | ORAL_TABLET | Freq: Four times a day (QID) | ORAL | Status: DC
  Administered 2018-03-14 – 2018-03-16 (×9): 600 mg via ORAL
  Filled 2018-03-14 (×9): qty 1

## 2018-03-14 MED ORDER — PRENATAL MULTIVITAMIN CH
1.0000 | ORAL_TABLET | Freq: Every day | ORAL | Status: DC
  Administered 2018-03-14 – 2018-03-16 (×3): 1 via ORAL
  Filled 2018-03-14 (×3): qty 1

## 2018-03-14 MED ORDER — DIBUCAINE 1 % RE OINT: 1.0000 "application " | TOPICAL_OINTMENT | RECTAL | Status: DC | PRN

## 2018-03-14 MED ORDER — SODIUM CHLORIDE 0.9% FLUSH: 3.0000 mL | Freq: Two times a day (BID) | INTRAVENOUS | Status: DC

## 2018-03-14 NOTE — Discharge Summary (Signed)
Postpartum Discharge Summary     Patient Name: Gina Snow DOB: 1990-03-10 MRN: [id number]  Date of admission: 03/08/2018 Delivering Provider: Wells Guiles R   Date of discharge: 03/16/2018  Admitting diagnosis: Abnormal vaginal fluids [R87.9] Intrauterine pregnancy: [redacted]w[redacted]d     Secondary diagnosis:  Principal Problem:   Preterm premature rupture of membranes (PPROM) with unknown onset of labor Active Problems:   Tobacco use   Prediabetes   Inadequate social support FOB died November 30, 2017   CIN III (cervical intraepithelial neoplasia grade III) with severe dysplasia  Additional problems: none     Discharge diagnosis: Preterm Pregnancy Delivered                                                                                                Augmentation: Pitocin, Cytotec and Foley Balloon  Complications: GYB>63 hours  Hospital course:  Induction of Labor With Vaginal Delivery   28 y.o. yo G1P0 at [redacted]w[redacted]d was admitted to the hospital 03/08/2018 for induction of labor.  Indication for induction: PPROM.  Patient had an uncomplicated labor course as follows: Membrane Rupture Time/Date: 8:30 PM ,03/08/2018   Intrapartum Procedures: Episiotomy: None [1]                                         Lacerations:     Patient had delivery of a Viable infant.  Information for the patient's newborn:  Gina, Snow [[id number]]      03/14/2018  Details of delivery can be found in separate delivery note.  Patient had a routine postpartum course. Patient is discharged home 03/16/18.  Magnesium Sulfate recieved: No BMZ received: Yes  Physical exam  Vitals:   03/15/18 1610 03/15/18 2007 03/16/18 0417 03/16/18 0808  BP: 121/63 134/66 (!) 113/58 115/73  Pulse: 99 80 74 65  Resp: 20 19 20 18   Temp: 98.7 F (37.1 C) 98.4 F (36.9 C) 97.8 F (36.6 C) 98.5 F (36.9 C)  TempSrc: Oral Oral Oral Oral  SpO2: 100% 98% 100% 100%  Weight:      Height:       General: alert, cooperative and  no distress Lochia: appropriate Uterine Fundus: firm DVT Evaluation: No evidence of DVT seen on physical exam. Labs: Lab Results  Component Value Date   WBC 17.8 (H) 03/12/2018   HGB 10.7 (L) 03/12/2018   HCT 34.3 (L) 03/12/2018   MCV 103.6 (H) 03/12/2018   PLT 215 03/12/2018   CMP Latest Ref Rng & Units 02/28/2018  Glucose 70 - 99 mg/dL 83  BUN 6 - 20 mg/dL 6  Creatinine 0.44 - 1.00 mg/dL 0.42(L)  Sodium 135 - 145 mmol/L 135  Potassium 3.5 - 5.1 mmol/L 3.7  Chloride 98 - 111 mmol/L 107  CO2 22 - 32 mmol/L 21(L)  Calcium 8.9 - 10.3 mg/dL 8.5(L)  Total Protein 6.5 - 8.1 g/dL 6.6  Total Bilirubin 0.3 - 1.2 mg/dL 0.4  Alkaline Phos 38 - 126 U/L 71  AST 15 - 41 U/L 13(L)  ALT 0 - 44 U/L 13    Discharge instruction: per After Visit Summary and "Baby and Me Booklet".  After visit meds:  Allergies as of 03/16/2018   No Known Allergies     Medication List    TAKE these medications   acetaminophen 500 MG tablet Commonly known as:  TYLENOL Take 500 mg by mouth every 6 (six) hours as needed for mild pain.   albuterol 108 (90 Base) MCG/ACT inhaler Commonly known as:  PROVENTIL HFA;VENTOLIN HFA Inhale 1-2 puffs into the lungs every 6 (six) hours as needed for wheezing or shortness of breath.   cephALEXin 500 MG capsule Commonly known as:  KEFLEX Take 1 capsule (500 mg total) by mouth 4 (four) times daily.   CITRANATAL ASSURE 35-1 & 300 MG tablet One tablet and one capsule daily What changed:    how much to take  how to take this  when to take this  additional instructions   ibuprofen 600 MG tablet Commonly known as:  ADVIL,MOTRIN Take 1 tablet (600 mg total) by mouth every 6 (six) hours.       Diet: routine diet  Activity: Advance as tolerated. Pelvic rest for 6 weeks.   Outpatient follow up:4 weeks Follow up Appt: Future Appointments  Date Time Provider [place]  04/16/2018  8:30 AM Roma Schanz, CNM [facility]   Follow up  Visit: [place]. Schedule an appointment as soon as possible for a visit.   Contact information: [address] [place] [address] [phone]           Anticipated contraception: Nexplanon   Newborn Data: Live born female  Birth Weight:   APGAR: ,   Newborn Delivery   Birth date/time:  03/14/2018 07:17:00 Delivery type:  Vaginal, Spontaneous     Baby Feeding: Bottle and Breast Disposition:NICU   03/16/2018 Mora Bellman, MD

## 2018-03-14 NOTE — Progress Notes (Signed)
Patient ID: Gina Snow, female   DOB: Mar 06, 1990, 28 y.o.   MRN: [id number] Gina Snow is a 28 y.o. G1P0 at [redacted]w[redacted]d admitted for induction of labor due to PPROM.  Subjective: Comfortable, no complaints  Objective: BP (!) 123/56   Pulse 87   Temp 98.3 F (36.8 C) (Axillary)   Resp 16   Ht 5\' 4"  (1.626 m)   Wt 111.6 kg   LMP 08/03/2017   SpO2 98%   BMI 42.23 kg/m  Total I/O In: -  Out: 3000 [Urine:3000]  FHT:  FHR: 150 bpm, variability: moderate,  accelerations:  Present,  decelerations:  Absent UC:   regular, every 5 minutes  SVE:   Dilation: 6.5 Effacement (%): 80 Station: -1 Exam by:: s seagraves rn   Labs: Lab Results  Component Value Date   WBC 17.8 (H) 03/12/2018   HGB 10.7 (L) 03/12/2018   HCT 34.3 (L) 03/12/2018   MCV 103.6 (H) 03/12/2018   PLT 215 03/12/2018    Assessment / Plan: IOL d/t PPROM, s/p foley and cytotec, continues to progress on own, will monitor for need for pitocin  Labor: Progressing normally Fetal Wellbeing:  Category I Pain Control:  Epidural Pre-eclampsia: n/a I/D:  PCN for GBS+ Anticipated MOD:  NSVD  Roma Schanz CNM, WHNP-BC 03/14/2018, 0130

## 2018-03-14 NOTE — Progress Notes (Signed)
CSW acknowledges consult. CSW attempted to meet with MOB, however MOB was asleep. CSW will attempt to visit with MOB at a later time.   Abundio Miu, [place] Worker [facility] Cell#: [phone]

## 2018-03-14 NOTE — Telephone Encounter (Signed)
Lmom for pt to call us back to set up pp appt.  03-14-2018  AS

## 2018-03-14 NOTE — Progress Notes (Signed)
Patient ID: Gina Snow, female   DOB: 01-02-1991, 28 y.o.   MRN: [id number] Gina Snow is a 28 y.o. G1P0 at [redacted]w[redacted]d admitted for induction of labor due to PPROM.  Subjective: Comfortable, no complaints  Objective: BP 115/64   Pulse 92   Temp 98.3 F (36.8 C) (Axillary)   Resp 16   Ht 5\' 4"  (1.626 m)   Wt 111.6 kg   LMP 08/03/2017   SpO2 97%   BMI 42.23 kg/m  Total I/O In: -  Out: 3000 [Urine:3000]  FHT:  FHR: 145 bpm, variability: moderate,  accelerations:  Present,  decelerations:  Present occ mild variable UC:   q 4-29mins, have spaced out  SVE:   Dilation: 6.5 Effacement (%): 80 Station: -1 Exam by:: s seagraves rn    Labs: Lab Results  Component Value Date   WBC 17.8 (H) 03/12/2018   HGB 10.7 (L) 03/12/2018   HCT 34.3 (L) 03/12/2018   MCV 103.6 (H) 03/12/2018   PLT 215 03/12/2018    Assessment / Plan: IOL d/t PPROM, uc's spacing out, no cervical change this check, will start pitocin per protocol  Labor: slowing- start pitocin Fetal Wellbeing:  Category II Pain Control:  Epidural Pre-eclampsia: n/a I/D:  PCN for GBS+ Anticipated MOD:  NSVD  Roma Schanz CNM, WHNP-BC 03/14/2018, 0430

## 2018-03-14 NOTE — Progress Notes (Signed)
CSW attempted to meet with MOB to complete assessment, however MOB was in the NICU visiting with infant. CSW will attempt to visit with MOB at a later time.  Abundio Miu, [place] Worker [facility] Cell#: [phone]

## 2018-03-15 ENCOUNTER — Telehealth: Payer: Self-pay | Admitting: *Deleted

## 2018-03-15 NOTE — Telephone Encounter (Signed)
Lmom for pt to call us back to schedule pp appointment.  03-15-2018  AS

## 2018-03-15 NOTE — Lactation Note (Addendum)
This note was copied from a baby's chart. Lactation Consultation Note  Patient Name: Gina Snow [id number] Date: 03/15/2018 Reason for consult: Initial assessment;NICU baby;Primapara;1st time breastfeeding;Late-preterm 34-36.6wks;Other (Comment)(pumping DEBP )  Baby is 20 hours old .  As LC entered the room per mom just finished pumping with the DEBP #24 F and it is comfortable.  The most pumped off is 10 ml. LC praised mom for her pumping x 3 in the last 24 hours and encouraged her  To increase to 8 times in 24 hours to enhance milk coming to volume.  LC reviewed hand expressing and recommended hand expressing prior to pump and after pumping.  Per mom has called [facility]  And the rep recommended for mom to call when mom gets home.  LC obtained information from mom  to [place] a referral to [facility] today for a DEBP tomorrow on the day  Of D/C.  Mother informed of post-discharge support and given phone number to the lactation department, including services for phone call assistance; out-patient appointments; and breastfeeding support group. List of other breastfeeding resources in the community given in the handout. Encouraged mother to call for problems or concerns related to breastfeeding.  Mom receptive to teaching and review of DEBP .   [place] faxed as [place]  loaner referral to [facility] at 11:50 am.    Maternal Data Has patient been taught Hand Expression?: Yes(LC encouraged prior to pumping and afterwards ) Does the patient have breastfeeding experience prior to this delivery?: No  Feeding Feeding Type: Donor Breast Milk Nipple Type: Slow - flow  LATCH Score                   Interventions Interventions: Breast feeding basics reviewed;DEBP  Lactation Tools Discussed/Used Tools: Pump Breast pump type: Double-Electric Breast Pump WIC Program: Yes Pump Review: Setup, frequency, and cleaning;Milk Storage(reviewed by [facility] )   Consult  Status Consult Status: Follow-up Date: 03/16/18 Follow-up type: In-patient    [place] 03/15/2018, 10:50 AM

## 2018-03-15 NOTE — Progress Notes (Signed)
CSW attempted to see MOB to complete assessment, however lactation was seeing MOB. CSW will attempt to see MOB at a later time.  Abundio Miu, [place] Worker [facility] Cell#: [phone]

## 2018-03-15 NOTE — Progress Notes (Signed)
Post Partum Day 1 Subjective: Patient reports feeling well. She denies chest pain, lightheadeness/dizziness  Objective: Blood pressure 118/82, pulse 81, temperature 97.9 F (36.6 C), temperature source Oral, resp. rate 18, height 5\' 4"  (1.626 m), weight 111.6 kg, last menstrual period 08/03/2017, SpO2 100 %.  Physical Exam:  General: alert, cooperative and no distress Lochia: appropriate Uterine Fundus: firm DVT Evaluation: No evidence of DVT seen on physical exam.  No results for input(s): HGB, HCT in the last 72 hours.  Assessment/Plan: Patient is doing well postpartum day 1 Continue routine postpartum care   LOS: 6 days   Ranata Laughery 03/15/2018, 9:56 AM

## 2018-03-15 NOTE — Anesthesia Postprocedure Evaluation (Signed)
Anesthesia Post Note  Patient: Gina Snow  Procedure(s) Performed: AN AD HOC LABOR EPIDURAL     Patient location during evaluation: Mother Baby Anesthesia Type: Epidural Level of consciousness: awake and alert Pain management: pain level controlled Vital Signs Assessment: post-procedure vital signs reviewed and stable Respiratory status: spontaneous breathing, nonlabored ventilation and respiratory function stable Cardiovascular status: stable Postop Assessment: no headache, no backache and epidural receding Anesthetic complications: no    Last Vitals:  Vitals:   03/14/18 2232 03/15/18 0429  BP: 137/78 133/68  Pulse: 79 71  Resp: 17 18  Temp: 37.1 C 36.8 C  SpO2: 100% 99%    Last Pain:  Vitals:   03/15/18 0511  TempSrc:   PainSc: 0-No pain                 Ariyonna Twichell

## 2018-03-16 ENCOUNTER — Encounter (HOSPITAL_COMMUNITY): Payer: Self-pay | Admitting: *Deleted

## 2018-03-16 MED ORDER — IBUPROFEN 600 MG PO TABS: 600.0000 mg | ORAL_TABLET | Freq: Four times a day (QID) | ORAL | 0 refills | Status: DC

## 2018-03-16 NOTE — Progress Notes (Signed)
Pt discharged to home with sister and friend.  Condition stable.  Pt ambulated to car with RN.  No equipment for home ordered at discharge.  Pt has manual breast pump and parts kit to use in NICU over the weekend with DEBP.  Pt plans to get DEBP from [facility] on Feb 3.

## 2018-03-16 NOTE — Discharge Instructions (Signed)

## 2018-03-16 NOTE — Clinical Social Work Maternal (Signed)
CLINICAL SOCIAL WORK MATERNAL/CHILD NOTE  Patient Details  Name: Gina Snow MRN: [id number] Date of Birth: [birth year]  Date:  03/16/2018  Clinical Social Worker Initiating Note:  Abundio Miu, [place] Date/Time: Initiated:  03/16/18/1044     Child's Name:  Larene Beach   Biological Parents:  Mother, Father(Father : Buzzy Han (deceased))   Need for Interpreter:  None   Reason for Referral:  Parental Support of Premature Babies < 68 weeks/or Critically Ill babies, Other (Comment)(FOB died in 01/07/2023; Limited Supports)   Address:  [address] [place] [address]    Phone number: [phone]  Additional phone number:   Household Members/Support Persons (HM/SP):       HM/SP Name Relationship DOB or Age  HM/SP -1        HM/SP -2        HM/SP -3        HM/SP -4        HM/SP -5        HM/SP -6        HM/SP -7        HM/SP -8          Natural Supports (not living in the home):  Extended Family(Aunts, Grandma and Sister )   Professional Supports: None   Employment: Animator   Type of Work: Education officer, environmental CNA   Education:  [employer] school graduate   Homebound arranged:    Museum/gallery curator Resources:  [employer]   Other Resources:  [employer], Physicist, medical    Cultural/Religious Considerations Which May Impact Care:  Jehovah Witness  Strengths:  Ability to meet basic needs , Home prepared for child , Engineer, materials, Understanding of illness   Psychotropic Medications:         Pediatrician:    [place]  Pediatrician List:   [place] Other([facility])  [facility]      Pediatrician Fax Number:    Risk Factors/Current Problems:  None   Cognitive State:  Able to Concentrate , Alert , Linear Thinking , Goal Oriented    Mood/Affect:  Relaxed , Calm , Interested    CSW Assessment: CSW spoke with MOB at bedside regarding NICU  admission and supports. MOB was pleasant and engaged throughout assessment. CSW introduced self and reason for consult. 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 MOB reported that she has all items for baby, noting her aunts have started buying items for when baby turns 49 year old. MOB reported that her house is full of stuff for the baby. MOB reported that she has a basinet and a crib for infant. MOB reported that FOB is deceased and did not share details. CSW offered condolescenes for MOB's loss. CSW inquired about MOB's support system, MOB reported that her aunts, grandma and sisters are her supports. MOB reported that her supports live about 2 minutes away and that she plans to stay with her aunt for a few weeks once infant is discharged. MOB shared that she loss her mother at [age] and her aunt took her and her sisters in. CSW offered condolescenes for MOB's loss and inquired about how MOB coped with loss. MOB reported that her family is Jehovah Witnesses and that she went to church and read the bible to cope. MOB reported that losing her mother made her stronger.   CSW and MOB discussed  infant's NICU admission. MOB reported that she initially cried about infant's NICU admission. CSW normalized MOB's feelings and crying about infant's NICU admission. MOB reported that she knows infant is getting good care in the NICU and plans to visit often. CSW reported that she has been talking to staff when she visits infant in the NICU and feels that she is getting to know them. CSW and MOB discussed the NICU and supports available to MOB. CSW asked MOB how she was feeling currently, MOB reported that she was feeling okay and that she was being discharged today. MOB reported that she planned on coming back later today to visit infant.   CSW inquired about MOB's mental health history, MOB denied any mental health history. MOB presented calm and was pleasant when speaking with CSW. MOB did not demonstrate  any acute mental health signs/symptoms. CSW assessed for safety, MOB denied SI, HI and domestic violence.   CSW provided education regarding the baby blues period vs. perinatal mood disorders, discussed treatment and gave resources for mental health follow up if concerns arise.  CSW recommends self-evaluation during the postpartum time period using the New Mom Checklist from Postpartum Progress and encouraged MOB to contact a medical professional if symptoms are noted at any time.    CSW will continue to offer support and resources while infant is admitted in the NICU.   CSW Plan/Description:  Perinatal Mood and Anxiety Disorder (PMADs) Education, No Further Intervention Required/No Barriers to Discharge    Burnis Medin, LCSW 03/16/2018, 10:47 AM

## 2018-03-16 NOTE — Lactation Note (Signed)
This note was copied from a baby's chart. Lactation Consultation Note: Follow up visit with this P34 mom of baby born at 59 w 1 d in NICU. Mom reports she pumped 3 times yesterday, Encouraged to pump 8 times/ 24 hours. Reports she is obtaining small amounts of Colostrum that she is taking to NICU. Praise given for her efforts. Plans to get pump from [facility] in [address] this afternoon. No questions at present. Reviewed our phone number to call with questions, and when needs assist with latching baby in NICU.    Patient Name: Gina Snow GLOVF'I Date: 03/16/2018 Reason for consult: Follow-up assessment;NICU baby;Late-preterm 34-36.6wks   Maternal Data Has patient been taught Hand Expression?: Yes Does the patient have breastfeeding experience prior to this delivery?: No  Feeding Feeding Type: Donor Breast Milk  LATCH Score                   Interventions    Lactation Tools Discussed/Used WIC Program: Yes   Consult Status Consult Status: Complete    Truddie Crumble 03/16/2018, 8:23 AM

## 2018-04-06 ENCOUNTER — Encounter (HOSPITAL_COMMUNITY): Payer: Self-pay | Admitting: Emergency Medicine

## 2018-04-06 ENCOUNTER — Emergency Department (HOSPITAL_COMMUNITY)
Admission: EM | Admit: 2018-04-06 | Discharge: 2018-04-06 | Disposition: A | Discharge status: Home / Self Care | Payer: Medicaid Other | Attending: Emergency Medicine | Admitting: Emergency Medicine

## 2018-04-06 ENCOUNTER — Other Ambulatory Visit: Payer: Self-pay

## 2018-04-06 DIAGNOSIS — N390 Urinary tract infection, site not specified: Secondary | ICD-10-CM | POA: Insufficient documentation

## 2018-04-06 DIAGNOSIS — F17210 Nicotine dependence, cigarettes, uncomplicated: Secondary | ICD-10-CM | POA: Diagnosis not present

## 2018-04-06 DIAGNOSIS — J45909 Unspecified asthma, uncomplicated: Secondary | ICD-10-CM | POA: Diagnosis not present

## 2018-04-06 DIAGNOSIS — R1030 Lower abdominal pain, unspecified: Secondary | ICD-10-CM | POA: Diagnosis present

## 2018-04-06 DIAGNOSIS — Z79899 Other long term (current) drug therapy: Secondary | ICD-10-CM | POA: Diagnosis not present

## 2018-04-06 LAB — COMPREHENSIVE METABOLIC PANEL
ALT: 23 U/L (ref 0–44)
AST: 16 U/L (ref 15–41)
Albumin: 3.5 g/dL (ref 3.5–5.0)
Alkaline Phosphatase: 69 U/L (ref 38–126)
Anion gap: 6 (ref 5–15)
BUN: 11 mg/dL (ref 6–20)
CO2: 25 mmol/L (ref 22–32)
Calcium: 8.4 mg/dL — ABNORMAL LOW (ref 8.9–10.3)
Chloride: 106 mmol/L (ref 98–111)
Creatinine, Ser: 0.75 mg/dL (ref 0.44–1.00)
GFR calc Af Amer: >60 mL/min (ref >60)
GFR calc non Af Amer: >60 mL/min (ref >60)
Glucose, Bld: 96 mg/dL (ref 70–99)
Potassium: 4.3 mmol/L (ref 3.5–5.1)
SODIUM: 137 mmol/L (ref 135–145)
Total Bilirubin: 0.6 mg/dL (ref 0.3–1.2)
Total Protein: 6.3 g/dL — ABNORMAL LOW (ref 6.5–8.1)

## 2018-04-06 LAB — CBC WITH DIFFERENTIAL/PLATELET
Abs Immature Granulocytes: 0.03 10*3/uL (ref 0.00–0.07)
Basophils Absolute: 0 10*3/uL (ref 0.0–0.1)
Basophils Relative: 0 %
Eosinophils Absolute: 0.2 10*3/uL (ref 0.0–0.5)
Eosinophils Relative: 2 %
HCT: 37.8 % (ref 36.0–46.0)
Hemoglobin: 11.9 g/dL — ABNORMAL LOW (ref 12.0–15.0)
Immature Granulocytes: 0 %
Lymphocytes Relative: 11 %
Lymphs Abs: 1.1 10*3/uL (ref 0.7–4.0)
MCH: 31.3 pg (ref 26.0–34.0)
MCHC: 31.5 g/dL (ref 30.0–36.0)
MCV: 99.5 fL (ref 80.0–100.0)
Monocytes Absolute: 0.6 10*3/uL (ref 0.1–1.0)
Monocytes Relative: 6 %
Neutro Abs: 8.3 10*3/uL — ABNORMAL HIGH (ref 1.7–7.7)
Neutrophils Relative %: 81 %
Platelets: 259 10*3/uL (ref 150–400)
RBC: 3.8 MIL/uL — ABNORMAL LOW (ref 3.87–5.11)
RDW: 13.6 % (ref 11.5–15.5)
WBC: 10.2 10*3/uL (ref 4.0–10.5)
nRBC: 0 % (ref 0.0–0.2)

## 2018-04-06 LAB — URINALYSIS, ROUTINE W REFLEX MICROSCOPIC
BACTERIA UA: NONE SEEN
Bilirubin Urine: NEGATIVE
Glucose, UA: NEGATIVE mg/dL
Ketones, ur: NEGATIVE mg/dL
Nitrite: NEGATIVE
Protein, ur: NEGATIVE mg/dL
Specific Gravity, Urine: 1.012 (ref 1.005–1.030)
pH: 6 (ref 5.0–8.0)

## 2018-04-06 LAB — PREGNANCY, URINE: Preg Test, Ur: NEGATIVE

## 2018-04-06 LAB — LIPASE, BLOOD: Lipase: 31 U/L (ref 11–51)

## 2018-04-06 MED ORDER — CEPHALEXIN 500 MG PO CAPS: 500.0000 mg | ORAL_CAPSULE | Freq: Four times a day (QID) | ORAL | 0 refills | Status: DC

## 2018-04-06 NOTE — ED Notes (Signed)
3 week post partum   Lower abd pain   L&R side   Was told would have this pain but states that she was told if she could not take the pain at discharge from [facility]/baby to go to ED  [facility]

## 2018-04-06 NOTE — ED Notes (Signed)
Lab at bedside

## 2018-04-06 NOTE — ED Provider Notes (Signed)
[facility] EMERGENCY DEPARTMENT Provider Note   CSN: [id number] Arrival date & time: 04/06/18  1138    History   Chief Complaint Chief Complaint  Patient presents with  . Abdominal Pain    HPI Gina Snow is a 28 y.o. female.     HPI   She complains of pain in her lower abdomen which started today.  The pain is bilateral and associated with a tight feeling when she voids urine.  No alteration in bowel habits.  No alteration in oral intake.  She has a small amount of vaginal discharge currently her vaginal bleeding stopped a couple days ago.  She delivered a child by vaginal, following premature rupture of membranes, 03/14/2018.  She denies fever, chills, nausea or vomiting, currently.  There are no other known modifying factors.  Past Medical History:  Diagnosis Date  . Asthma   . Obesity   . PCO (polycystic ovaries)     Patient Active Problem List   Diagnosis Date Noted  . Preterm premature rupture of membranes (PPROM) with unknown onset of labor 03/09/2018  . Inadequate social support FOB died 11/28/2017 01/13/2018  . CIN III (cervical intraepithelial neoplasia grade III) with severe dysplasia 2018-01-13  . Supervision of normal first pregnancy 10/03/2017  . PCOS (polycystic ovarian syndrome) 12/21/2012  . Prediabetes 10/26/2012  . Hyperglycemia 09/24/2012  . Tobacco use 09/17/2012    Past Surgical History:  Procedure Laterality Date  . NO PAST SURGERIES       OB History    Gravida  1   Para  1   Term      Preterm  1   AB  0   Living  1     SAB      TAB      Ectopic      Multiple      Live Births               Home Medications    Prior to Admission medications   Medication Sig Start Date End Date Taking? Authorizing Provider  acetaminophen (TYLENOL) 500 MG tablet Take 500 mg by mouth every 6 (six) hours as needed for mild pain.    [provider]  albuterol (PROVENTIL HFA;VENTOLIN HFA) 108 (90 Base) MCG/ACT inhaler  Inhale 1-2 puffs into the lungs every 6 (six) hours as needed for wheezing or shortness of breath. 02/12/18   Tacy Learn, PA-C  cephALEXin (KEFLEX) 500 MG capsule Take 1 capsule (500 mg total) by mouth 4 (four) times daily. 04/06/18   Daleen Bo, MD  ibuprofen (ADVIL,MOTRIN) 600 MG tablet Take 1 tablet (600 mg total) by mouth every 6 (six) hours. 03/16/18   Constant, Peggy, MD  Prenat w/o A-[id number]-DSS-FA-DHA (CITRANATAL ASSURE) 35-1 & 300 MG tablet One tablet and one capsule daily Patient taking differently: Take 2 tablets by mouth daily.  10/03/17   Roma Schanz, CNM    Family History Family History  Problem Relation Age of Onset  . Clotting disorder Mother 6       had "brain" clot?  Marland Kitchen Hypertension Maternal Grandmother   . Diabetes Maternal Grandmother   . Thyroid disease Maternal Grandmother   . Hypertension Maternal Grandfather   . Diabetes Paternal Grandmother     Social History Social History   Tobacco Use  . Smoking status: Current Every Day Smoker    Packs/day: 0.25    Years: 8.00    Pack years: 2.00    Types: Cigarettes  .  Smokeless tobacco: Never Used  . Tobacco comment: 2-3 per day  Substance Use Topics  . Alcohol use: Not Currently    Comment: every once in a while  . Drug use: No     Allergies   Patient has no known allergies.   Review of Systems Review of Systems  All other systems reviewed and are negative.    Physical Exam Updated Vital Signs BP 123/66 (BP Location: Left Arm)   Pulse 80   Temp 98.4 F (36.9 C) (Oral)   Resp 18   Ht 5\' 4"  (1.626 m)   Wt 103.4 kg   SpO2 100%   BMI 39.14 kg/m   Physical Exam Vitals signs and nursing note reviewed.  Constitutional:      General: She is not in acute distress.    Appearance: She is well-developed and normal weight. She is not ill-appearing, toxic-appearing or diaphoretic.  HENT:     Head: Normocephalic and atraumatic.  Eyes:     Conjunctiva/sclera: Conjunctivae normal.      Pupils: Pupils are equal, round, and reactive to light.  Neck:     Musculoskeletal: Normal range of motion and neck supple.     Trachea: Phonation normal.  Cardiovascular:     Rate and Rhythm: Normal rate and regular rhythm.  Pulmonary:     Effort: Pulmonary effort is normal.     Breath sounds: Normal breath sounds.  Chest:     Chest wall: No tenderness.  Abdominal:     General: There is no distension.     Palpations: Abdomen is soft. There is no mass.     Tenderness: There is no abdominal tenderness. There is no guarding.  Musculoskeletal: Normal range of motion.  Skin:    General: Skin is warm and dry.  Neurological:     Mental Status: She is alert and oriented to person, place, and time.     Motor: No abnormal muscle tone.  Psychiatric:        Mood and Affect: Mood normal.        Behavior: Behavior normal.        Thought Content: Thought content normal.        Judgment: Judgment normal.      ED Treatments / Results  Labs (all labs ordered are listed, but only abnormal results are displayed) Labs Reviewed  URINALYSIS, ROUTINE W REFLEX MICROSCOPIC - Abnormal; Notable for the following components:      Result Value   Hgb urine dipstick SMALL (*)    Leukocytes,Ua MODERATE (*)    All other components within normal limits  CBC WITH DIFFERENTIAL/PLATELET - Abnormal; Notable for the following components:   RBC 3.80 (*)    Hemoglobin 11.9 (*)    Neutro Abs 8.3 (*)    All other components within normal limits  COMPREHENSIVE METABOLIC PANEL - Abnormal; Notable for the following components:   Calcium 8.4 (*)    Total Protein 6.3 (*)    All other components within normal limits  PREGNANCY, URINE  LIPASE, BLOOD    EKG None  Radiology No results found.  Procedures Procedures (including critical care time)  Medications Ordered in ED Medications - No data to display   Initial Impression / Assessment and Plan / ED Course  I have reviewed the triage vital signs and  the nursing notes.  Pertinent labs & imaging results that were available during my care of the patient were reviewed by me and considered in my medical decision making (  see chart for details).         Patient Vitals for the past 24 hrs:  BP Temp Temp src Pulse Resp SpO2 Height Weight  04/06/18 1230 - - - 80 - 100 % - -  04/06/18 1213 - 98.4 F (36.9 C) Oral - - - - -  04/06/18 1209 123/66 98.4 F (36.9 C) Oral 78 18 100 % - -  04/06/18 1153 - - - - - - 5\' 4"  (1.626 m) 103.4 kg  04/06/18 1152 124/68 98.3 F (36.8 C) Oral 74 16 100 % - -    1:15 PM Reevaluation with update and discussion. After initial assessment and treatment, an updated evaluation reveals no change in clinical status.  Findings discussed with the patient and all questions were answered. Daleen Bo   Medical Decision Making: Valuation consistent with urinary tract infection.  Doubt pyelonephritis, metabolic instability or impending vascular collapse.  CRITICAL CARE-no Performed by: Daleen Bo  Nursing Notes Reviewed/ Care Coordinated Applicable Imaging Reviewed Interpretation of Laboratory Data incorporated into ED treatment  The patient appears reasonably screened and/or stabilized for discharge and I doubt any other medical condition or other [facility] requiring further screening, evaluation, or treatment in the ED at this time prior to discharge.  Plan: Home Medications-OTC analgesia of choice; Home Treatments-drink plenty of fluids; return here if the recommended treatment, does not improve the symptoms; Recommended follow up-PCP follow-up 7 to 10 days for repeat urinalysis.  Consider urology follow-up for recurrent infections, if the antibiotic does not clear at this time.    Final Clinical Impressions(s) / ED Diagnoses   Final diagnoses:  Urinary tract infection without hematuria, site unspecified    ED Discharge Orders         Ordered    cephALEXin (KEFLEX) 500 MG capsule  4 times daily      04/06/18 1314           Daleen Bo, MD 04/06/18 1316

## 2018-04-06 NOTE — Discharge Instructions (Signed)
We are treating you for urinary tract infection.  Please read the attached information about urinary tract infections.  Some things that can help, to avoid infections in the future, include drinking plenty of fluids especially water.  Also cleaning carefully after urinating and bowel movements can decrease the chance of another infection.  Try to get some exercise, by walking each day.  Take the medicine sent to your pharmacy to treat the infection.

## 2018-04-06 NOTE — ED Triage Notes (Signed)
PT states she delivered vaginally at [facility] on 03/14/2018 at [redacted] weeks gestation due to water breaking prematurely. PT is currently breastfeeding. PT states lower abdominal pain bilaterally that started last night. PT denies and vomiting or diarrhea but states nausea at times and denies any urinary or vaginal symptoms. PT states scheduled to f/u with [facility] 04/16/2018 for routine post delivery visit.

## 2018-04-16 ENCOUNTER — Encounter: Payer: Self-pay | Admitting: *Deleted

## 2018-04-16 ENCOUNTER — Encounter: Payer: Self-pay | Admitting: Women's Health

## 2018-04-16 ENCOUNTER — Other Ambulatory Visit: Payer: Self-pay | Admitting: Women's Health

## 2018-04-16 ENCOUNTER — Ambulatory Visit (INDEPENDENT_AMBULATORY_CARE_PROVIDER_SITE_OTHER): Payer: Medicaid Other | Admitting: Women's Health

## 2018-04-16 DIAGNOSIS — Z1389 Encounter for screening for other disorder: Secondary | ICD-10-CM

## 2018-04-16 DIAGNOSIS — Z3202 Encounter for pregnancy test, result negative: Secondary | ICD-10-CM

## 2018-04-16 DIAGNOSIS — Z3009 Encounter for other general counseling and advice on contraception: Secondary | ICD-10-CM

## 2018-04-16 DIAGNOSIS — Z8751 Personal history of pre-term labor: Secondary | ICD-10-CM | POA: Insufficient documentation

## 2018-04-16 DIAGNOSIS — D069 Carcinoma in situ of cervix, unspecified: Secondary | ICD-10-CM

## 2018-04-16 DIAGNOSIS — R300 Dysuria: Secondary | ICD-10-CM

## 2018-04-16 LAB — POCT URINALYSIS DIPSTICK
Glucose, UA: NEGATIVE
Nitrite, UA: NEGATIVE
Protein, UA: POSITIVE — AB

## 2018-04-16 LAB — POCT URINE PREGNANCY: PREG TEST UR: NEGATIVE

## 2018-04-16 NOTE — Progress Notes (Signed)
POSTPARTUM VISIT Patient name: Gina Snow MRN [id number]  Date of birth: [birth year] Chief Complaint:   Postpartum Care  History of Present Illness:   Gina Snow is a 28 y.o. G61P0101 African American female being seen today for a postpartum visit. She is 4 weeks postpartum following a spontaneous vaginal delivery at 34.1 gestational weeks after IOL d/t PPROM @ 33.1. Anesthesia: epidural. Laceration: none. I have fully reviewed the prenatal and intrapartum course. Pregnancy complicated by PPROM. Postpartum course has been complicated by UTI, just finished antibiotics from ED, don't see urine culture. Bleeding no bleeding. Bowel function is normal. Bladder function is normal. Reports no current uti sx, just wanted urine dipped.  Patient is not sexually active. Last sexual activity: prior to birth of baby.  Contraception method is wants Nexplanon.  [place] Postpartum Depression Screening: negative. Score 0.   Last pap 10/21/17.  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 (Menstrual status: Lactating).  Baby's course has been complicated by premature birth, NICU stay. Baby is feeding by breast/bottle.  Review of Systems:   Pertinent items are noted in HPI Denies Abnormal vaginal discharge w/ itching/odor/irritation, headaches, visual changes, shortness of breath, chest pain, abdominal pain, severe nausea/vomiting, or problems with urination or bowel movements. Pertinent History Reviewed:  Reviewed past medical,surgical, obstetrical and family history.  Reviewed problem list, medications and allergies. OB History  Gravida Para Term Preterm AB Living  1 1   1  0 1  SAB TAB Ectopic Multiple Live Births               # Outcome Date GA Lbr Len/2nd Weight Sex Delivery Anes PTL Lv  1 Preterm 03/14/18 [redacted]w[redacted]d    Vag-Spont      Physical Assessment:   Vitals:   04/16/18 0903  BP: 123/76  Pulse: 74  Weight: 239 lb 9.6 oz (108.7 kg)  Height: 5\' 4"   (1.626 m)  Body mass index is 41.13 kg/m.       Physical Examination:   General appearance: alert, well appearing, and in no distress  Mental status: alert, oriented to person, place, and time  Skin: warm & dry   Cardiovascular: normal heart rate noted   Respiratory: normal respiratory effort, no distress   Breasts: deferred, no complaints   Abdomen: soft, non-tender   Pelvic: VULVA: normal appearing vulva with no masses, tenderness or lesions, UTERUS: uterus is normal size, shape, consistency and nontender  Rectal: no hemorrhoids  Extremities: no edema       Results for orders placed or performed in visit on 04/16/18 (from the past 24 hour(s))  POCT Urinalysis Dipstick   Collection Time: 04/16/18  9:09 AM  Result Value Ref Range   Color, UA     Clarity, UA     Glucose, UA Negative Negative   Bilirubin, UA     Ketones, UA large    Spec Grav, UA     Blood, UA large    pH, UA     Protein, UA Positive (A) Negative   Urobilinogen, UA     Nitrite, UA neg    Leukocytes, UA Large (3+) (A) Negative   Appearance     Odor    POCT urine pregnancy   Collection Time: 04/16/18  9:10 AM  Result Value Ref Range   Preg Test, Ur Negative Negative    Assessment & Plan:  1) Postpartum exam 2) 4 wks s/p SVB @ 34wks after PPROM at 33wks 3) Breast &bottlefeeding  4) Depression screening 5) Contraception counseling, pt prefers abstinence until after Nexplanon, order today  6) Recent UTI> send urine cx today, will call pt if abnormal 7) Abnormal pap during pregnancy> needs colpo 6-8wks pp  Meds: No orders of the defined types were placed in this encounter.   Follow-up: Return in about 3 weeks (around 05/07/2018) for Nexplanon insertion (order today please), then 4wks from now for colpo w/ MD.   Orders Placed This Encounter  Procedures  . Urine Culture  . POCT Urinalysis Dipstick  . POCT urine pregnancy    [place], [facility] 04/16/2018 9:45 AM

## 2018-04-16 NOTE — Patient Instructions (Signed)
NO SEX UNTIL AFTER YOU GET YOUR BIRTH CONTROL   Tips To Increase Milk Supply  Lots of water! Enough so that your urine is clear  Plenty of calories, if you're not getting enough calories, your milk supply can decrease  Breastfeed/pump often, every 2-3 hours x 20-39mins  Fenugreek 3 pills 3 times a day, this may make your urine smell like maple syrup  Mother's Milk Tea  Lactation cookies, google for the recipe  Real oatmeal

## 2018-04-18 LAB — URINE CULTURE

## 2018-04-23 ENCOUNTER — Ambulatory Visit (INDEPENDENT_AMBULATORY_CARE_PROVIDER_SITE_OTHER): Payer: Medicaid Other | Admitting: Women's Health

## 2018-04-23 ENCOUNTER — Encounter: Payer: Self-pay | Admitting: Women's Health

## 2018-04-23 VITALS — BP 128/87 | HR 89 | Ht 64.0 in | Wt 244.2 lb

## 2018-04-23 DIAGNOSIS — L0231 Cutaneous abscess of buttock: Secondary | ICD-10-CM

## 2018-04-23 MED ORDER — SULFAMETHOXAZOLE-TRIMETHOPRIM 800-160 MG PO TABS: 1.0000 | ORAL_TABLET | Freq: Two times a day (BID) | ORAL | 0 refills | Status: DC

## 2018-04-23 NOTE — Progress Notes (Signed)
   GYN VISIT Patient name: Gina Snow MRN [id number]  Date of birth: 1990-09-20 Chief Complaint:   work-in-gyn  History of Present Illness:   Gina Snow is a 28 y.o. G67P0101 African American female 5wks s/p SVB being seen today for boil inbetween buttocks x 2days. Had when she first went home from hospital after giving birth, was small then and went away on its own.  Breastfeeding.  No LMP recorded. (Menstrual status: Lactating). The current method of family planning is abstinence until nexplanon. Last pap 10/21/17. Results were: HSIL/CIN-2/CIN-3/CIS w/ normal colpo 11/09/17 Review of Systems:   Pertinent items are noted in HPI Denies fever/chills, dizziness, headaches, visual disturbances, fatigue, shortness of breath, chest pain, abdominal pain, vomiting, abnormal vaginal discharge/itching/odor/irritation, problems with periods, bowel movements, urination, or intercourse unless otherwise stated above.  Pertinent History Reviewed:  Reviewed past medical,surgical, social, obstetrical and family history.  Reviewed problem list, medications and allergies. Physical Assessment:   Vitals:   04/23/18 1442  BP: 128/87  Pulse: 89  Weight: 244 lb 3.2 oz (110.8 kg)  Height: 5\' 4"  (1.626 m)  Body mass index is 41.92 kg/m.       Physical Examination:   General appearance: alert, well appearing, and in no distress  Mental status: alert, oriented to person, place, and time  Skin: warm & dry   Cardiovascular: normal heart rate noted  Respiratory: normal respiratory effort, no distress  Abdomen: soft, non-tender   Pelvic: examination not indicated  Buttocks: ~3cm induration Rt gluteal fold, no head visible, tender to touch. Co-exam w/ LHE, who does not recommend I&D at this time  Extremities: no edema   No results found for this or any previous visit (from the past 24 hour(s)).  Assessment & Plan:  1) Rt gluteal fold abscess> rx bactrim bid x 10d, f/u 1wk  2) H/O abnormal pap>  scheduled for pp colpo on 3/30, will switch to next week and combine f/u  Meds:  Meds ordered this encounter  Medications  . sulfamethoxazole-trimethoprim (BACTRIM DS,SEPTRA DS) 800-160 MG tablet    Sig: Take 1 tablet by mouth 2 (two) times daily. X 10 days    Dispense:  20 tablet    Refill:  0    Order Specific Question:   Supervising Provider    Answer:   Elonda Husky, LUTHER H [[id number]]    No orders of the defined types were placed in this encounter.   Return in about 1 week (around 04/30/2018) for F/U.  [place], [facility] 04/23/2018 3:33 PM

## 2018-04-24 ENCOUNTER — Inpatient Hospital Stay (HOSPITAL_COMMUNITY): Admit: 2018-04-24 | Payer: Self-pay

## 2018-05-01 ENCOUNTER — Ambulatory Visit (INDEPENDENT_AMBULATORY_CARE_PROVIDER_SITE_OTHER): Payer: Medicaid Other | Admitting: Obstetrics & Gynecology

## 2018-05-01 ENCOUNTER — Other Ambulatory Visit: Payer: Self-pay

## 2018-05-01 ENCOUNTER — Encounter: Payer: Self-pay | Admitting: Obstetrics & Gynecology

## 2018-05-01 VITALS — BP 124/72 | HR 74 | Ht 64.0 in | Wt 246.0 lb

## 2018-05-01 DIAGNOSIS — D069 Carcinoma in situ of cervix, unspecified: Secondary | ICD-10-CM | POA: Diagnosis not present

## 2018-05-01 NOTE — Progress Notes (Signed)
Colposcopy Procedure Note:  Colposcopy Procedure Note  Indications: Pap smear 6 months ago showed: high-grade squamous intraepithelial neoplasia  (HGSIL-encompassing moderate and severe dysplasia). The prior pap showed no abnormalities.  Prior cervical/vaginal disease: . Prior cervical treatment: .  Smoker:  No. New sexual partner:  No.    History of abnormal Pap: no  Procedure Details  The risks and benefits of the procedure and Written informed consent obtained.  Speculum placed in vagina and excellent visualization of cervix achieved, cervix swabbed x 3 with acetic acid solution.  Findings: Cervix: no visible lesions, no mosaicism, no punctation and no abnormal vasculature; SCJ visualized 360 degrees without lesions and no biopsies taken. Vaginal inspection: normal without visible lesions. Vulvar colposcopy: vulvar colposcopy not performed.  Specimens: none  Complications: none.  Plan: Repeat Pap in September

## 2018-05-05 ENCOUNTER — Encounter (HOSPITAL_COMMUNITY): Payer: Self-pay | Admitting: Emergency Medicine

## 2018-05-05 ENCOUNTER — Emergency Department (HOSPITAL_COMMUNITY)
Admission: EM | Admit: 2018-05-05 | Discharge: 2018-05-05 | Disposition: A | Discharge status: Home / Self Care | Payer: Medicaid Other | Attending: Emergency Medicine | Admitting: Emergency Medicine

## 2018-05-05 ENCOUNTER — Other Ambulatory Visit: Payer: Self-pay

## 2018-05-05 DIAGNOSIS — R51 Headache: Secondary | ICD-10-CM | POA: Diagnosis present

## 2018-05-05 DIAGNOSIS — F17210 Nicotine dependence, cigarettes, uncomplicated: Secondary | ICD-10-CM | POA: Insufficient documentation

## 2018-05-05 DIAGNOSIS — J45909 Unspecified asthma, uncomplicated: Secondary | ICD-10-CM | POA: Insufficient documentation

## 2018-05-05 DIAGNOSIS — R69 Illness, unspecified: Secondary | ICD-10-CM

## 2018-05-05 DIAGNOSIS — J111 Influenza due to unidentified influenza virus with other respiratory manifestations: Secondary | ICD-10-CM | POA: Diagnosis not present

## 2018-05-05 LAB — GROUP A STREP BY PCR: Group A Strep by PCR: NOT DETECTED

## 2018-05-05 LAB — INFLUENZA PANEL BY PCR (TYPE A & B)
Influenza A By PCR: NEGATIVE
Influenza B By PCR: NEGATIVE

## 2018-05-05 MED ORDER — ACETAMINOPHEN 325 MG PO TABS
650.0000 mg | ORAL_TABLET | Freq: Once | ORAL | Status: DC | PRN
  Filled 2018-05-05: qty 2

## 2018-05-05 NOTE — Discharge Instructions (Signed)
Person Under Monitoring Name: Gina Snow  Location: [place] [address] [address]   Infection Prevention Recommendations for Individuals Confirmed to have, or Being Evaluated for, 2019 Novel Coronavirus (COVID-19) Infection Who Receive Care at Home  Individuals who are confirmed to have, or are being evaluated for, COVID-19 should follow the prevention steps below until a healthcare provider or local or state health department says they can return to normal activities.  Stay home except to get medical care You should restrict activities outside your home, except for getting medical care. Do not go to work, school, or public areas, and do not use public transportation or taxis.  Call ahead before visiting your doctor Before your medical appointment, call the healthcare provider and tell them that you have, or are being evaluated for, COVID-19 infection. This will help the healthcare providers office take steps to keep other people from getting infected. Ask your healthcare provider to call the local or state health department.  Monitor your symptoms Seek prompt medical attention if your illness is worsening (e.g., difficulty breathing). Before going to your medical appointment, call the healthcare provider and tell them that you have, or are being evaluated for, COVID-19 infection. Ask your healthcare provider to call the local or state health department.  Wear a facemask You should wear a facemask that covers your nose and mouth when you are in the same room with other people and when you visit a healthcare provider. People who live with or visit you should also wear a facemask while they are in the same room with you.  Separate yourself from other people in your home As much as possible, you should stay in a different room from other people in your home. Also, you should use a separate bathroom, if available.  Avoid sharing household items You should not  share dishes, drinking glasses, cups, eating utensils, towels, bedding, or other items with other people in your home. After using these items, you should wash them thoroughly with soap and water.  Cover your coughs and sneezes Cover your mouth and nose with a tissue when you cough or sneeze, or you can cough or sneeze into your sleeve. Throw used tissues in a lined trash can, and immediately wash your hands with soap and water for at least 20 seconds or use an alcohol-based hand rub.  Wash your [facility] your hands often and thoroughly with soap and water for at least 20 seconds. You can use an alcohol-based hand sanitizer if soap and water are not available and if your hands are not visibly dirty. Avoid touching your eyes, nose, and mouth with unwashed hands.   Prevention Steps for Caregivers and Household Members of Individuals Confirmed to have, or Being Evaluated for, COVID-19 Infection Being Cared for in the Home  If you live with, or provide care at home for, a person confirmed to have, or being evaluated for, COVID-19 infection please follow these guidelines to prevent infection:  Follow healthcare providers instructions Make sure that you understand and can help the patient follow any healthcare provider instructions for all care.  Provide for the patients basic needs You should help the patient with basic needs in the home and provide support for getting groceries, prescriptions, and other personal needs.  Monitor the patients symptoms If they are getting sicker, call his or her medical provider and tell them that the patient has, or is being evaluated for, COVID-19 infection. This will help the healthcare  providers office take steps to keep other people from getting infected. Ask the healthcare provider to call the local or state health department.  Limit the number of people who have contact with the patient If possible, have only one caregiver for the  patient. Other household members should stay in another home or place of residence. If this is not possible, they should stay in another room, or be separated from the patient as much as possible. Use a separate bathroom, if available. Restrict visitors who do not have an essential need to be in the home.  Keep older adults, very young children, and other sick people away from the patient Keep older adults, very young children, and those who have compromised immune systems or chronic health conditions away from the patient. This includes people with chronic heart, lung, or kidney conditions, diabetes, and cancer.  Ensure good ventilation Make sure that shared spaces in the home have good air flow, such as from an air conditioner or an opened window, weather permitting.  Wash your hands often Wash your hands often and thoroughly with soap and water for at least 20 seconds. You can use an alcohol based hand sanitizer if soap and water are not available and if your hands are not visibly dirty. Avoid touching your eyes, nose, and mouth with unwashed hands. Use disposable paper towels to dry your hands. If not available, use dedicated cloth towels and replace them when they become wet.  Wear a facemask and gloves Wear a disposable facemask at all times in the room and gloves when you touch or have contact with the patients blood, body fluids, and/or secretions or excretions, such as sweat, saliva, sputum, nasal mucus, vomit, urine, or feces.  Ensure the mask fits over your nose and mouth tightly, and do not touch it during use. Throw out disposable facemasks and gloves after using them. Do not reuse. Wash your hands immediately after removing your facemask and gloves. If your personal clothing becomes contaminated, carefully remove clothing and launder. Wash your hands after handling contaminated clothing. Place all used disposable facemasks, gloves, and other waste in a lined container before  disposing them with other household waste. Remove gloves and wash your hands immediately after handling these items.  Do not share dishes, glasses, or other household items with the patient Avoid sharing household items. You should not share dishes, drinking glasses, cups, eating utensils, towels, bedding, or other items with a patient who is confirmed to have, or being evaluated for, COVID-19 infection. After the person uses these items, you should wash them thoroughly with soap and water.  Wash laundry thoroughly Immediately remove and wash clothes or bedding that have blood, body fluids, and/or secretions or excretions, such as sweat, saliva, sputum, nasal mucus, vomit, urine, or feces, on them. Wear gloves when handling laundry from the patient. Read and follow directions on labels of laundry or clothing items and detergent. In general, wash and dry with the warmest temperatures recommended on the label.  Clean all areas the individual has used often Clean all touchable surfaces, such as counters, tabletops, doorknobs, bathroom fixtures, toilets, phones, keyboards, tablets, and bedside tables, every day. Also, clean any surfaces that may have blood, body fluids, and/or secretions or excretions on them. Wear gloves when cleaning surfaces the patient has come in contact with. Use a diluted bleach solution (e.g., dilute bleach with 1 part bleach and 10 parts water) or a household disinfectant with a label that says EPA-registered for coronaviruses. To make  a bleach solution at home, add 1 tablespoon of bleach to 1 quart (4 cups) of water. For a larger supply, add  cup of bleach to 1 gallon (16 cups) of water. Read labels of cleaning products and follow recommendations provided on product labels. Labels contain instructions for safe and effective use of the cleaning product including precautions you should take when applying the product, such as wearing gloves or eye protection and making sure you  have good ventilation during use of the product. Remove gloves and wash hands immediately after cleaning.  Monitor yourself for signs and symptoms of illness Caregivers and household members are considered close contacts, should monitor their health, and will be asked to limit movement outside of the home to the extent possible. Follow the monitoring steps for close contacts listed on the symptom monitoring form.   ? If you have additional questions, contact your local health department or call the epidemiologist on call at [phone] (available 24/7). ? This guidance is subject to change. For the most up-to-date guidance from [facility], please refer to their website: [email or web address]

## 2018-05-05 NOTE — ED Provider Notes (Signed)
[facility] EMERGENCY DEPARTMENT Provider Note   CSN: [id number] Arrival date & time: 05/05/18  1701    History   Chief Complaint Chief Complaint  Patient presents with  . Fever    HPI Gina Snow is a 28 y.o. female.     HPI Patient presents with fever and generalized headache which started today.  He has had some generalized fatigue.  Denies congestion, sinus pressure, sore throat, shortness of breath or cough.  No abdominal pain, nausea or vomiting.  No diarrhea.  Denies urinary symptoms.  No known sick contacts specifically no known exposure to Covid-19.  No recent travel out of state.  Past Medical History:  Diagnosis Date  . Asthma   . Obesity   . PCO (polycystic ovaries)     Patient Active Problem List   Diagnosis Date Noted  . History of preterm delivery 04/16/2018  . Inadequate social support FOB died 09-Dec-2017 2018/01/24  . CIN III (cervical intraepithelial neoplasia grade III) with severe dysplasia Jan 24, 2018  . PCOS (polycystic ovarian syndrome) 12/21/2012  . Prediabetes 10/26/2012  . Hyperglycemia 09/24/2012  . Tobacco use 09/17/2012    Past Surgical History:  Procedure Laterality Date  . NO PAST SURGERIES       OB History    Gravida  1   Para  1   Term      Preterm  1   AB  0   Living  1     SAB      TAB      Ectopic      Multiple      Live Births               Home Medications    Prior to Admission medications   Medication Sig Start Date End Date Taking? Authorizing Provider  acetaminophen (TYLENOL) 500 MG tablet Take 500 mg by mouth every 6 (six) hours as needed for mild pain.    [provider]  albuterol (PROVENTIL HFA;VENTOLIN HFA) 108 (90 Base) MCG/ACT inhaler Inhale 1-2 puffs into the lungs every 6 (six) hours as needed for wheezing or shortness of breath. 02/12/18   Tacy Learn, PA-C  Prenat w/o A-[id number]-[id number] (CITRANATAL ASSURE) 35-1 & 300 MG tablet One tablet and one capsule daily Patient  taking differently: Take 2 tablets by mouth daily.  10/03/17   Roma Schanz, CNM    Family History Family History  Problem Relation Age of Onset  . Clotting disorder Mother 68       had "brain" clot?  Marland Kitchen Hypertension Maternal Grandmother   . Diabetes Maternal Grandmother   . Thyroid disease Maternal Grandmother   . Hypertension Maternal Grandfather   . Diabetes Paternal Grandmother     Social History Social History   Tobacco Use  . Smoking status: Current Every Day Smoker    Packs/day: 0.25    Years: 8.00    Pack years: 2.00    Types: Cigarettes  . Smokeless tobacco: Never Used  . Tobacco comment: 2-3 per day  Substance Use Topics  . Alcohol use: Yes    Comment: every once in a while  . Drug use: No     Allergies   Patient has no known allergies.   Review of Systems Review of Systems  Constitutional: Positive for chills, fatigue and fever.  HENT: Negative for congestion, rhinorrhea, sinus pain and sore throat.   Respiratory: Negative for cough, shortness of breath and wheezing.   Cardiovascular: Negative for chest  pain.  Gastrointestinal: Negative for abdominal pain, diarrhea, nausea and vomiting.  Genitourinary: Negative for dysuria, flank pain, pelvic pain and vaginal discharge.  Musculoskeletal: Negative for back pain, myalgias, neck pain and neck stiffness.  Skin: Negative for rash and wound.  Neurological: Positive for headaches. Negative for dizziness, weakness, light-headedness and numbness.  All other systems reviewed and are negative.    Physical Exam Updated Vital Signs BP 125/72   Pulse (!) 130   Temp 98.8 F (37.1 C) (Oral)   Resp 18   Ht 5\' 4"  (1.626 m)   Wt 111.5 kg   LMP 04/21/2018   SpO2 98%   Breastfeeding No   BMI 42.19 kg/m   Physical Exam Vitals signs and nursing note reviewed.  Constitutional:      Appearance: Normal appearance. She is well-developed.  HENT:     Head: Normocephalic and atraumatic.     Nose: Nose normal.      Mouth/Throat:     Mouth: Mucous membranes are moist.     Pharynx: No oropharyngeal exudate or posterior oropharyngeal erythema.  Eyes:     Extraocular Movements: Extraocular movements intact.     Pupils: Pupils are equal, round, and reactive to light.  Neck:     Musculoskeletal: Normal range of motion and neck supple. No neck rigidity or muscular tenderness.     Comments: No meningismus Cardiovascular:     Rate and Rhythm: Regular rhythm. Tachycardia present.     Heart sounds: No murmur. No friction rub. No gallop.   Pulmonary:     Effort: Pulmonary effort is normal. No respiratory distress.     Breath sounds: Normal breath sounds. No stridor. No wheezing, rhonchi or rales.  Chest:     Chest wall: No tenderness.  Abdominal:     General: Bowel sounds are normal. There is no distension.     Palpations: Abdomen is soft. There is no mass.     Tenderness: There is no abdominal tenderness. There is no right CVA tenderness, left CVA tenderness, guarding or rebound.  Musculoskeletal: Normal range of motion.        General: No swelling, tenderness, deformity or signs of injury.     Right lower leg: No edema.     Left lower leg: No edema.  Lymphadenopathy:     Cervical: No cervical adenopathy.  Skin:    General: Skin is warm and dry.     Capillary Refill: Capillary refill takes less than 2 seconds.     Findings: No erythema or rash.  Neurological:     General: No focal deficit present.     Mental Status: She is alert and oriented to person, place, and time.  Psychiatric:        Mood and Affect: Mood normal.        Behavior: Behavior normal.      ED Treatments / Results  Labs (all labs ordered are listed, but only abnormal results are displayed) Labs Reviewed  GROUP A STREP BY PCR  INFLUENZA PANEL BY PCR (TYPE A & B)    EKG None  Radiology No results found.  Procedures Procedures (including critical care time)  Medications Ordered in ED Medications - No data to  display   Initial Impression / Assessment and Plan / ED Course  I have reviewed the triage vital signs and the nursing notes.  Pertinent labs & imaging results that were available during my care of the patient were reviewed by me and considered in my medical  decision making (see chart for details).       Patient is well-appearing.  Strep and flu are negative.  No Covid  contacts or travel to high risk areas.  Advised Tylenol as needed for symptom control.  Home quarantined instructions given.   Final Clinical Impressions(s) / ED Diagnoses   Final diagnoses:  Influenza-like illness    ED Discharge Orders    None       Julianne Rice, MD 05/06/18 [phone]

## 2018-05-05 NOTE — ED Triage Notes (Signed)
Patient c/o headache and fever that started today. Per patient highest temp 102.2. Patient reports taking tylenol prior to coming to ED. Denies any nausea, vomiting, diarrhea, or urinary symptoms.

## 2018-05-07 ENCOUNTER — Ambulatory Visit (INDEPENDENT_AMBULATORY_CARE_PROVIDER_SITE_OTHER): Payer: Medicaid Other | Admitting: Women's Health

## 2018-05-07 ENCOUNTER — Other Ambulatory Visit: Payer: Self-pay

## 2018-05-07 ENCOUNTER — Encounter: Payer: Self-pay | Admitting: Women's Health

## 2018-05-07 VITALS — BP 139/77 | HR 86 | Ht 64.0 in | Wt 247.0 lb

## 2018-05-07 DIAGNOSIS — Z3202 Encounter for pregnancy test, result negative: Secondary | ICD-10-CM

## 2018-05-07 DIAGNOSIS — Z30017 Encounter for initial prescription of implantable subdermal contraceptive: Secondary | ICD-10-CM | POA: Diagnosis not present

## 2018-05-07 LAB — POCT URINE PREGNANCY: Preg Test, Ur: NEGATIVE

## 2018-05-07 MED ORDER — ETONOGESTREL 68 MG ~~LOC~~ IMPL
68.0000 mg | DRUG_IMPLANT | Freq: Once | SUBCUTANEOUS | Status: AC
  Administered 2018-05-07: 68 mg via SUBCUTANEOUS

## 2018-05-07 NOTE — Patient Instructions (Signed)
Keep the area clean and dry.  You can remove the big bandage in 24 hours, and the small steri-strip bandage in 3-5 days.  A back up method, such as condoms, should be used for two weeks. You may have irregular vaginal bleeding for the first 6 months after the Nexplanon is placed, then the bleeding usually lightens and it is possible that you may not have any periods.  If you have any concerns, please give [place] a call.    Etonogestrel implant What is this medicine? ETONOGESTREL (et oh noe JES trel) is a contraceptive (birth control) device. It is used to prevent pregnancy. It can be used for up to 3 years. This medicine may be used for other purposes; ask your health care provider or pharmacist if you have questions. COMMON BRAND NAME(S): Implanon, Nexplanon What should I tell my health care provider before I take this medicine? They need to know if you have any of these conditions: -abnormal vaginal bleeding -blood vessel disease or blood clots -breast, cervical, endometrial, ovarian, liver, or uterine cancer -diabetes -gallbladder disease -heart disease or recent heart attack -high blood pressure -high cholesterol or triglycerides -kidney disease -liver disease -migraine headaches -seizures -stroke -tobacco smoker -an unusual or allergic reaction to etonogestrel, anesthetics or antiseptics, other medicines, foods, dyes, or preservatives -pregnant or trying to get pregnant -breast-feeding How should I use this medicine? This device is inserted just under the skin on the inner side of your upper arm by a health care professional. Talk to your pediatrician regarding the use of this medicine in children. Special care may be needed. Overdosage: If you think you have taken too much of this medicine contact a poison control center or emergency room at once. NOTE: This medicine is only for you. Do not share this medicine with others. What if I miss a dose? This does not apply. What may  interact with this medicine? Do not take this medicine with any of the following medications: -amprenavir -fosamprenavir This medicine may also interact with the following medications: -acitretin -aprepitant -armodafinil -bexarotene -bosentan -carbamazepine -certain medicines for fungal infections like fluconazole, ketoconazole, itraconazole and voriconazole -certain medicines to treat hepatitis, HIV or AIDS -cyclosporine -felbamate -griseofulvin -lamotrigine -modafinil -oxcarbazepine -phenobarbital -phenytoin -primidone -rifabutin -rifampin -rifapentine -St. John's wort -topiramate This list may not describe all possible interactions. Give your health care provider a list of all the medicines, herbs, non-prescription drugs, or dietary supplements you use. Also tell them if you smoke, drink alcohol, or use illegal drugs. Some items may interact with your medicine. What should I watch for while using this medicine? This product does not protect you against HIV infection (AIDS) or other sexually transmitted diseases. You should be able to feel the implant by pressing your fingertips over the skin where it was inserted. Contact your doctor if you cannot feel the implant, and use a non-hormonal birth control method (such as condoms) until your doctor confirms that the implant is in place. Contact your doctor if you think that the implant may have broken or become bent while in your arm. You will receive a user card from your health care provider after the implant is inserted. The card is a record of the location of the implant in your upper arm and when it should be removed. Keep this card with your health records. What side effects may I notice from receiving this medicine? Side effects that you should report to your doctor or health care professional as soon as possible: -allergic  reactions like skin rash, itching or hives, swelling of the face, lips, or tongue -breast lumps, breast  tissue changes, or discharge -breathing problems -changes in emotions or moods -if you feel that the implant may have broken or bent while in your arm -high blood pressure -pain, irritation, swelling, or bruising at the insertion site -scar at site of insertion -signs of infection at the insertion site such as fever, and skin redness, pain or discharge -signs and symptoms of a blood clot such as breathing problems; changes in vision; chest pain; severe, sudden headache; pain, swelling, warmth in the leg; trouble speaking; sudden numbness or weakness of the face, arm or leg -signs and symptoms of liver injury like dark yellow or brown urine; general ill feeling or flu-like symptoms; light-colored stools; loss of appetite; nausea; right upper belly pain; unusually weak or tired; yellowing of the eyes or skin -unusual vaginal bleeding, discharge Side effects that usually do not require medical attention (report to your doctor or health care professional if they continue or are bothersome): -acne -breast pain or tenderness -headache -irregular menstrual bleeding -nausea This list may not describe all possible side effects. Call your doctor for medical advice about side effects. You may report side effects to FDA at 1-800-FDA-1088. Where should I keep my medicine? This drug is given in a hospital or clinic and will not be stored at home. NOTE: This sheet is a summary. It may not cover all possible information. If you have questions about this medicine, talk to your doctor, pharmacist, or health care provider.  2019 Elsevier/Gold Standard (2016-12-20 14:11:42)

## 2018-05-07 NOTE — Progress Notes (Signed)
   [place] Patient name: Gina Snow MRN [id number]  Date of birth: 1990/07/03 Subjective Findings:   Gina Snow is a 28 y.o. G38P0101 African American female being seen today for insertion of a Nexplanon.   Patient's last menstrual period was 04/21/2018. Last sexual intercourse was prior to birth of baby Last pap9/17/19. Results were:  HSIL, normal colpo, repeat pap Sept  Risks/benefits/side effects of Nexplanon have been discussed and her questions have been answered.  Specifically, a failure rate of 02/998 has been reported, with an increased failure rate if pt takes [place] and/or antiseizure medicaitons.  She is aware of the common side effect of irregular bleeding, which the incidence of decreases over time. Signed copy of informed consent in chart.  Pertinent History Reviewed:   Reviewed past medical,surgical, social, obstetrical and family history.  Reviewed problem list, medications and allergies. Objective Findings & Procedure:    Vitals:   05/07/18 1037  BP: 139/77  Pulse: 86  Weight: 247 lb (112 kg)  Height: 5\' 4"  (1.626 m)  Body mass index is 42.4 kg/m.  Results for orders placed or performed in visit on 05/07/18 (from the past 24 hour(s))  POCT urine pregnancy   Collection Time: 05/07/18 10:36 AM  Result Value Ref Range   Preg Test, Ur Negative Negative     Time out was performed.  She is right-handed, so her left arm, approximately 10cm from the medial epicondyle and 3-5cm posterior to the sulcus, was cleansed with alcohol and anesthetized with 2cc of 2% Lidocaine.  The area was cleansed again with betadine and the Nexplanon was inserted per manufacturer's recommendations without difficulty.  3 steri-strips and pressure bandage were applied. The patient tolerated the procedure well.  Assessment & Plan:   1) Nexplanon insertion Pt was instructed to keep the area clean and dry, remove pressure bandage in 24 hours, and keep insertion  site covered with the steri-strip for 3-5 days.  Back up contraception was recommended for 2 weeks.  She was given a card indicating date Nexplanon was inserted and date it needs to be removed. Follow-up PRN problems.  Orders Placed This Encounter  Procedures  . POCT urine pregnancy    Follow-up: Return for after 9/17 for pap & physical.  Roma Schanz CNM, [facility] 05/07/2018 11:06 AM

## 2018-05-14 ENCOUNTER — Encounter: Payer: Self-pay | Admitting: Obstetrics & Gynecology

## 2018-09-10 ENCOUNTER — Ambulatory Visit
Admission: RE | Admit: 2018-09-10 | Discharge: 2018-09-10 | Discharge status: Against Medical Advice | Payer: Medicaid Other | Source: Ambulatory Visit

## 2018-09-10 ENCOUNTER — Other Ambulatory Visit: Payer: Self-pay

## 2018-09-10 ENCOUNTER — Other Ambulatory Visit: Payer: Medicaid Other

## 2018-09-10 DIAGNOSIS — Z20822 Contact with and (suspected) exposure to covid-19: Secondary | ICD-10-CM

## 2018-09-12 LAB — NOVEL CORONAVIRUS, NAA: SARS-CoV-2, NAA: NOT DETECTED

## 2018-10-30 ENCOUNTER — Other Ambulatory Visit: Payer: Self-pay

## 2018-10-30 DIAGNOSIS — Z20822 Contact with and (suspected) exposure to covid-19: Secondary | ICD-10-CM

## 2018-11-01 LAB — NOVEL CORONAVIRUS, NAA: SARS-CoV-2, NAA: NOT DETECTED

## 2018-12-24 ENCOUNTER — Other Ambulatory Visit: Payer: Medicaid Other

## 2018-12-25 ENCOUNTER — Other Ambulatory Visit: Payer: Self-pay

## 2018-12-25 ENCOUNTER — Other Ambulatory Visit (HOSPITAL_COMMUNITY)
Admission: RE | Admit: 2018-12-25 | Discharge: 2018-12-25 | Disposition: A | Discharge status: Home / Self Care | Payer: Medicaid Other | Source: Ambulatory Visit | Attending: Obstetrics & Gynecology | Admitting: Obstetrics & Gynecology

## 2018-12-25 ENCOUNTER — Encounter: Payer: Self-pay | Admitting: *Deleted

## 2018-12-25 ENCOUNTER — Other Ambulatory Visit (INDEPENDENT_AMBULATORY_CARE_PROVIDER_SITE_OTHER): Payer: Medicaid Other | Admitting: *Deleted

## 2018-12-25 DIAGNOSIS — Z113 Encounter for screening for infections with a predominantly sexual mode of transmission: Secondary | ICD-10-CM | POA: Diagnosis not present

## 2018-12-25 DIAGNOSIS — N898 Other specified noninflammatory disorders of vagina: Secondary | ICD-10-CM | POA: Insufficient documentation

## 2018-12-25 DIAGNOSIS — N949 Unspecified condition associated with female genital organs and menstrual cycle: Secondary | ICD-10-CM | POA: Insufficient documentation

## 2018-12-25 NOTE — Progress Notes (Addendum)
   NURSE VISIT- VAGINITIS/STD/POC  SUBJECTIVE:  Gina Snow is a 28 y.o. G1P0101 GYN patientfemale here for a vaginal swab for vaginitis screening, STD screen.  She reports the following symptoms: burning, itching for 4 days. Denies abnormal vaginal bleeding, significant pelvic pain, fever, or UTI symptoms.  OBJECTIVE:  There were no vitals taken for this visit.  Appears well, in no apparent distress  ASSESSMENT: Vaginal swab for vaginitis screening, STD's  PLAN: Self-collected vaginal probe for Gonorrhea, Chlamydia, Trichomonas, Bacterial Vaginosis, Yeast sent to lab Treatment: to be determined once results are received Follow-up as needed if symptoms persist/worsen, or new symptoms develop.  Kristeen Miss Cresenzo  12/25/2018 10:20 AM   Chart reviewed for nurse visit. Agree with plan of care.  Roma Schanz, [place] 12/25/2018 12:12 PM

## 2018-12-26 LAB — CERVICOVAGINAL ANCILLARY ONLY
Bacterial Vaginitis (gardnerella): POSITIVE — AB
Candida Glabrata: NEGATIVE
Candida Vaginitis: POSITIVE — AB
Chlamydia: NEGATIVE
Comment: NEGATIVE
Comment: NEGATIVE
Comment: NEGATIVE
Comment: NEGATIVE
Comment: NEGATIVE
Comment: NORMAL
Neisseria Gonorrhea: NEGATIVE
Trichomonas: POSITIVE — AB

## 2018-12-27 ENCOUNTER — Other Ambulatory Visit: Payer: Self-pay | Admitting: Women's Health

## 2018-12-27 ENCOUNTER — Encounter: Payer: Self-pay | Admitting: Women's Health

## 2018-12-27 DIAGNOSIS — A599 Trichomoniasis, unspecified: Secondary | ICD-10-CM | POA: Insufficient documentation

## 2018-12-27 MED ORDER — FLUCONAZOLE 150 MG PO TABS: 150.0000 mg | ORAL_TABLET | Freq: Once | ORAL | 0 refills | Status: AC

## 2018-12-27 MED ORDER — METRONIDAZOLE 500 MG PO TABS: 500.0000 mg | ORAL_TABLET | Freq: Two times a day (BID) | ORAL | 0 refills | Status: DC

## 2019-01-25 ENCOUNTER — Other Ambulatory Visit: Payer: Self-pay

## 2019-01-25 DIAGNOSIS — Z20822 Contact with and (suspected) exposure to covid-19: Secondary | ICD-10-CM

## 2019-01-26 LAB — NOVEL CORONAVIRUS, NAA: SARS-CoV-2, NAA: NOT DETECTED

## 2019-01-28 ENCOUNTER — Other Ambulatory Visit: Payer: Self-pay

## 2019-05-16 ENCOUNTER — Other Ambulatory Visit: Payer: Self-pay

## 2019-05-16 ENCOUNTER — Ambulatory Visit: Payer: Medicaid Other | Attending: Internal Medicine

## 2019-05-16 DIAGNOSIS — Z20822 Contact with and (suspected) exposure to covid-19: Secondary | ICD-10-CM

## 2019-05-17 LAB — NOVEL CORONAVIRUS, NAA: SARS-CoV-2, NAA: NOT DETECTED

## 2019-05-17 LAB — SARS-COV-2, NAA 2 DAY TAT

## 2019-11-11 ENCOUNTER — Other Ambulatory Visit: Payer: Self-pay

## 2019-11-13 ENCOUNTER — Ambulatory Visit (INDEPENDENT_AMBULATORY_CARE_PROVIDER_SITE_OTHER): Payer: Medicaid Other | Admitting: Advanced Practice Midwife

## 2019-11-13 ENCOUNTER — Encounter: Payer: Self-pay | Admitting: Advanced Practice Midwife

## 2019-11-13 VITALS — BP 135/85 | HR 77 | Ht 64.0 in | Wt 261.0 lb

## 2019-11-13 DIAGNOSIS — Z3046 Encounter for surveillance of implantable subdermal contraceptive: Secondary | ICD-10-CM | POA: Diagnosis not present

## 2019-11-13 MED ORDER — NORETHIN ACE-ETH ESTRAD-FE 1-20 MG-MCG PO TABS: 1.0000 | ORAL_TABLET | Freq: Every day | ORAL | 11 refills | Status: DC

## 2019-11-13 NOTE — Progress Notes (Signed)
° °  [name]  Date of birth: 18-Mar-1990 Subjective Findings:   Gina Snow is a 29 y.o. G55P0101 African American female being seen today for removal of a Nexplanon. Her Nexplanon was placed March 2020.  She desires removal because she feels like it is making weight loss sluggish, and she doesn't care for the irreg spotting. Signed copy of informed consent in chart.   No LMP recorded. Last pap Sept 2019. Results were:  HSIL/CIN-2/CIS with normal colpo; also normal colpo March 2020- was due for repeat pap Sept 2020 The planned method of family planning is changing from Nex> OCPs Depression screen [facility] 2/9 10/03/2017 09/17/2012  Decreased Interest 0 0  Down, Depressed, Hopeless 0 1  PHQ - 2 Score 0 1  Altered sleeping 1 -  Tired, decreased energy 2 -  Change in appetite 1 -  Feeling bad or failure about yourself  0 -  Trouble concentrating 0 -  Moving slowly or fidgety/restless 0 -  Suicidal thoughts 0 -  PHQ-9 Score 4 -    Pertinent History Reviewed:   Reviewed past medical,surgical, social, obstetrical and family history.  Reviewed problem list, medications and allergies. Objective Findings & Procedure:    Vitals:   11/13/19 1339  BP: 135/85  Pulse: 77  Weight: 261 lb (118.4 kg)  Height: 5\' 4"  (1.626 m)  Body mass index is 44.8 kg/m.  No results found for this or any previous visit (from the past 24 hour(s)).   Time out was performed.  Nexplanon site identified.  Area prepped in usual sterile fashon. One cc of 2% lidocaine was used to anesthetize the area at the distal end of the implant. A small stab incision was made right beside the implant on the distal portion.  The Nexplanon rod was grasped using hemostats and removed without difficulty.  There was less than 3 cc blood loss. There were no complications.  Steri-strips were applied over the small incision and a pressure bandage was applied.  The patient tolerated the  procedure well. Assessment & Plan:   1) Nexplanon removal She was instructed to keep the area clean and dry, remove pressure bandage in 24 hours, and keep insertion site covered with the steri-strip for 3-5 days.   Follow-up PRN problems.  2) Desires to start OCPs, rx Junel; OCP instructions given  3) Hx abn Pap, needs repeat  No orders of the defined types were placed in this encounter.   Follow-up: Return in about 4 weeks (around 12/11/2019) for Pap and f/u contraception.  Myrtis Ser CNM 11/13/2019 2:09 PM

## 2019-12-11 ENCOUNTER — Other Ambulatory Visit: Payer: Medicaid Other | Admitting: Advanced Practice Midwife

## 2019-12-25 ENCOUNTER — Other Ambulatory Visit: Payer: Medicaid Other | Admitting: Advanced Practice Midwife

## 2020-01-22 ENCOUNTER — Other Ambulatory Visit: Payer: Medicaid Other | Admitting: Advanced Practice Midwife

## 2020-01-28 ENCOUNTER — Other Ambulatory Visit: Payer: Medicaid Other

## 2020-01-28 ENCOUNTER — Other Ambulatory Visit: Payer: Self-pay

## 2020-01-28 DIAGNOSIS — Z20822 Contact with and (suspected) exposure to covid-19: Secondary | ICD-10-CM

## 2020-01-30 LAB — NOVEL CORONAVIRUS, NAA: SARS-CoV-2, NAA: NOT DETECTED

## 2020-01-30 LAB — SARS-COV-2, NAA 2 DAY TAT

## 2020-02-13 ENCOUNTER — Other Ambulatory Visit: Payer: Medicaid Other

## 2020-02-13 DIAGNOSIS — Z20822 Contact with and (suspected) exposure to covid-19: Secondary | ICD-10-CM

## 2020-02-15 LAB — SARS-COV-2, NAA 2 DAY TAT

## 2020-02-15 LAB — NOVEL CORONAVIRUS, NAA: SARS-CoV-2, NAA: NOT DETECTED

## 2020-03-11 ENCOUNTER — Other Ambulatory Visit: Payer: Medicaid Other | Admitting: Advanced Practice Midwife

## 2020-07-28 ENCOUNTER — Other Ambulatory Visit: Payer: Self-pay

## 2020-07-28 ENCOUNTER — Ambulatory Visit (INDEPENDENT_AMBULATORY_CARE_PROVIDER_SITE_OTHER): Payer: Medicaid Other

## 2020-07-28 VITALS — BP 122/81 | HR 83 | Ht 64.0 in | Wt 248.8 lb

## 2020-07-28 DIAGNOSIS — Z3201 Encounter for pregnancy test, result positive: Secondary | ICD-10-CM | POA: Diagnosis not present

## 2020-07-28 DIAGNOSIS — Z3200 Encounter for pregnancy test, result unknown: Secondary | ICD-10-CM

## 2020-07-28 LAB — POCT URINE PREGNANCY: Preg Test, Ur: POSITIVE — AB

## 2020-07-28 MED ORDER — CITRANATAL ASSURE 35-1 & 300 MG PO MISC: ORAL | 3 refills | Status: DC

## 2020-07-28 NOTE — Progress Notes (Addendum)
   NURSE VISIT- PREGNANCY CONFIRMATION   SUBJECTIVE:  Gina Snow is a 30 y.o. G34P0101 female at [redacted]w[redacted]d by certain LMP of Patient's last menstrual period was 06/10/2020 (approximate). Here for pregnancy confirmation.  Home pregnancy test: positive x 1   She reports nausea and vomiting.  She is not taking prenatal vitamins.    OBJECTIVE:  BP 122/81 (BP Location: Right Arm, Patient Position: Sitting, Cuff Size: Large)   Pulse 83   Ht 5\' 4"  (1.626 m)   Wt 248 lb 12.8 oz (112.9 kg)   LMP 06/10/2020 (Approximate)   BMI 42.71 kg/m   Appears well, in no apparent distress OB History  Gravida Para Term Preterm AB Living  2 1   1  0 1  SAB IAB Ectopic Multiple Live Births               # Outcome Date GA Lbr Len/2nd Weight Sex Delivery Anes PTL Lv  2 Current           1 Preterm 03/14/18 [redacted]w[redacted]d    Vag-Spont       Results for orders placed or performed in visit on 07/28/20 (from the past 24 hour(s))  POCT urine pregnancy   Collection Time: 07/28/20  3:45 PM  Result Value Ref Range   Preg Test, Ur Positive (A) Negative    ASSESSMENT: Positive pregnancy test, [redacted]w[redacted]d by LMP    PLAN: Schedule for dating ultrasound in 1-2 weeks Prenatal vitamins: note routed to Knute Neu to send prescription   Nausea medicines: not currently needed   OB packet given: Yes  Gina Snow A Gina Snow  07/28/2020 3:51 PM   Chart reviewed for nurse visit. Agree with plan of care. Rx pnv. Gina Snow, [place] 07/28/2020 6:20 PM

## 2020-07-29 ENCOUNTER — Telehealth: Payer: Self-pay | Admitting: Adult Health

## 2020-07-29 NOTE — Telephone Encounter (Signed)
Pt was concerned that she had to pay for her prenatal vit. I called pharmacy and since pt is an adult now, she has a $3.00 copay for meds. Pt aware and voiced understanding. Gore

## 2020-07-29 NOTE — Telephone Encounter (Signed)
Patient called stating that her nausea medication cost her some money and she did not understand why, patient called her [employer] med[employer] just to see if they have changed something when it comes to nausea medication and they state it was the way it was worded, it should have stated no limitation. Please contact pt

## 2020-08-10 ENCOUNTER — Other Ambulatory Visit: Payer: Self-pay | Admitting: Obstetrics & Gynecology

## 2020-08-10 DIAGNOSIS — O3680X0 Pregnancy with inconclusive fetal viability, not applicable or unspecified: Secondary | ICD-10-CM

## 2020-08-11 ENCOUNTER — Other Ambulatory Visit: Payer: Self-pay

## 2020-08-11 ENCOUNTER — Ambulatory Visit (INDEPENDENT_AMBULATORY_CARE_PROVIDER_SITE_OTHER): Payer: Medicaid Other

## 2020-08-11 DIAGNOSIS — Z3A08 8 weeks gestation of pregnancy: Secondary | ICD-10-CM | POA: Diagnosis not present

## 2020-08-11 DIAGNOSIS — O3680X0 Pregnancy with inconclusive fetal viability, not applicable or unspecified: Secondary | ICD-10-CM | POA: Diagnosis not present

## 2020-08-11 NOTE — Progress Notes (Signed)
[place] 8+6 wks,single IUP,CRL 26.81 mm,normal ovaries,fhr 164 BPM

## 2020-08-24 ENCOUNTER — Other Ambulatory Visit: Payer: Medicaid Other

## 2020-09-01 ENCOUNTER — Other Ambulatory Visit: Payer: Self-pay | Admitting: Obstetrics & Gynecology

## 2020-09-01 DIAGNOSIS — Z3682 Encounter for antenatal screening for nuchal translucency: Secondary | ICD-10-CM

## 2020-09-02 ENCOUNTER — Ambulatory Visit (INDEPENDENT_AMBULATORY_CARE_PROVIDER_SITE_OTHER): Payer: Medicaid Other | Admitting: Advanced Practice Midwife

## 2020-09-02 ENCOUNTER — Ambulatory Visit (INDEPENDENT_AMBULATORY_CARE_PROVIDER_SITE_OTHER): Payer: Medicaid Other

## 2020-09-02 ENCOUNTER — Other Ambulatory Visit (HOSPITAL_COMMUNITY)
Admission: RE | Admit: 2020-09-02 | Discharge: 2020-09-02 | Disposition: A | Discharge status: Home / Self Care | Payer: Medicaid Other | Source: Ambulatory Visit | Attending: Advanced Practice Midwife | Admitting: Advanced Practice Midwife

## 2020-09-02 ENCOUNTER — Encounter: Payer: Self-pay | Admitting: Advanced Practice Midwife

## 2020-09-02 ENCOUNTER — Other Ambulatory Visit: Payer: Self-pay

## 2020-09-02 ENCOUNTER — Ambulatory Visit: Payer: Medicaid Other | Admitting: *Deleted

## 2020-09-02 VITALS — BP 128/80 | HR 84 | Wt 247.0 lb

## 2020-09-02 DIAGNOSIS — Z3A12 12 weeks gestation of pregnancy: Secondary | ICD-10-CM | POA: Diagnosis present

## 2020-09-02 DIAGNOSIS — Z3682 Encounter for antenatal screening for nuchal translucency: Secondary | ICD-10-CM | POA: Diagnosis not present

## 2020-09-02 DIAGNOSIS — Z3480 Encounter for supervision of other normal pregnancy, unspecified trimester: Secondary | ICD-10-CM | POA: Insufficient documentation

## 2020-09-02 DIAGNOSIS — D069 Carcinoma in situ of cervix, unspecified: Secondary | ICD-10-CM

## 2020-09-02 DIAGNOSIS — Z113 Encounter for screening for infections with a predominantly sexual mode of transmission: Secondary | ICD-10-CM

## 2020-09-02 DIAGNOSIS — Z124 Encounter for screening for malignant neoplasm of cervix: Secondary | ICD-10-CM | POA: Diagnosis present

## 2020-09-02 DIAGNOSIS — Z3490 Encounter for supervision of normal pregnancy, unspecified, unspecified trimester: Secondary | ICD-10-CM | POA: Insufficient documentation

## 2020-09-02 DIAGNOSIS — Z6841 Body Mass Index (BMI) 40.0 and over, adult: Secondary | ICD-10-CM

## 2020-09-02 DIAGNOSIS — Z8751 Personal history of pre-term labor: Secondary | ICD-10-CM

## 2020-09-02 DIAGNOSIS — Z720 Tobacco use: Secondary | ICD-10-CM

## 2020-09-02 MED ORDER — ASPIRIN 81 MG PO CHEW: 162.0000 mg | CHEWABLE_TABLET | Freq: Every day | ORAL | 7 refills | Status: DC

## 2020-09-02 NOTE — Progress Notes (Signed)
INITIAL OBSTETRICAL VISIT Patient name: Gina Snow MRN [id number]  Date of birth: 09-06-1990 Chief Complaint:   Initial Prenatal Visit  History of Present Illness:   Gina Snow is a 30 y.o. G67P0101 African-American female at [redacted]w[redacted]d by LMP c/w u/s at 8.6 weeks with an Estimated Date of Delivery: 03/17/21 being seen today for her initial obstetrical visit.   Patient's last menstrual period was 06/10/2020 (approximate). Her obstetrical history is significant for  33wk PPROM with IOL at 34wks .   Today she reports no complaints.  Last pap Sept 2019. Results were: HSIL w/ HRHPV not done (colpo Sept 2019 and 2020 were nl; no repeat Pap since)  Depression screen [facility] 2/9 09/02/2020 10/03/2017 09/17/2012  Decreased Interest 1 0 0  Down, Depressed, Hopeless 2 0 1  PHQ - 2 Score 3 0 1  Altered sleeping 1 1 -  Tired, decreased energy 1 2 -  Change in appetite 0 1 -  Feeling bad or failure about yourself  0 0 -  Trouble concentrating 0 0 -  Moving slowly or fidgety/restless 0 0 -  Suicidal thoughts 0 0 -  PHQ-9 Score 5 4 -     GAD 7 : Generalized Anxiety Score 09/02/2020  Nervous, Anxious, on Edge 0  Control/stop worrying 1  Worry too much - different things 0  Trouble relaxing 1  Restless 0  Easily annoyed or irritable 0  Afraid - awful might happen 0  Total GAD 7 Score 2     Review of Systems:   Pertinent items are noted in HPI Denies cramping/contractions, leakage of fluid, vaginal bleeding, abnormal vaginal discharge w/ itching/odor/irritation, headaches, visual changes, shortness of breath, chest pain, abdominal pain, severe nausea/vomiting, or problems with urination or bowel movements unless otherwise stated above.  Pertinent History Reviewed:  Reviewed past medical,surgical, social, obstetrical and family history.  Reviewed problem list, medications and allergies. OB History  Gravida Para Term Preterm AB Living  2 1 0 1 0 1  SAB IAB Ectopic Multiple Live Births   0 0 0   1    # Outcome Date GA Lbr Len/2nd Weight Sex Delivery Anes PTL Lv  2 Current           1 Preterm 03/14/18 [redacted]w[redacted]d  4 lb 8 oz (2.041 kg) F Vag-Spont EPI N LIV     Complications: Preterm premature rupture of membranes   Physical Assessment:   Vitals:   09/02/20 1113  BP: 128/80  Pulse: 84  Weight: 247 lb (112 kg)  Body mass index is 42.4 kg/m.       Physical Examination:  General appearance - well appearing, and in no distress  Mental status - alert, oriented to person, place, and time  Psych:  She has a normal mood and affect  Skin - warm and dry, normal color, no suspicious lesions noted  Chest - effort normal, all lung fields clear to auscultation bilaterally  Heart - normal rate and regular rhythm  Abdomen - soft, nontender  Extremities:  No swelling or varicosities noted  Pelvic - VULVA: normal appearing vulva with no masses, tenderness or lesions  VAGINA: normal appearing vagina with normal color and discharge, no lesions  CERVIX: normal appearing cervix without discharge or lesions, no CMT  Thin prep pap is done with HR HPV cotesting  Chaperone: Celene Squibb    TODAY'S NT [place] 12 wks,measurements c/w dates,CRL 61.42 mm,normal ovaries,NB present,NT 1.3 mm,posterior placenta,fhr 148 bpm  No results  found for this or any previous visit (from the past 24 hour(s)).  Assessment & Plan:  1) Low-Risk Pregnancy G2P0101 at [redacted]w[redacted]d with an Estimated Date of Delivery: 03/17/21   2) Initial OB visit  3) Smoker, down to 5 per day; requests [place] [employer] referral- will fax  4) Hx PPROM @ 33 wk with 34wk IOL, offered weekly Makena to start at [place]- desires  5) Hx HSIL/CIN-2/CIN-3 with nl colpo x 2, in 2019 and 2020; repeat Pap today  Meds:  Meds ordered this encounter  Medications   aspirin 81 MG chewable tablet    Sig: Chew 2 tablets (162 mg total) by mouth daily.    Dispense:  60 tablet    Refill:  7    Order Specific Question:   Supervising Provider    Answer:   Tania Ade H [[id number]]    Initial labs obtained Continue prenatal vitamins Reviewed n/v relief measures and warning s/s to report Reviewed recommended weight gain based on pre-gravid BMI Encouraged well-balanced diet Genetic & carrier screening discussed: requests [place] and NT/IT, declines Horizon  Ultrasound discussed; fetal survey: requested [place] completed> form faxed if has or is planning to apply for medicaid The nature of [place] for [employer] with multiple MDs and other Advanced Practice Providers was explained to patient; also emphasized that fellows, residents, and students are part of our team. Does have home bp cuff. Office bp cuff given: no. Rx sent: n/a. Check bp weekly, let us know if consistently >140/90.   Indications for ASA therapy (per uptodate) OR Two or more of the following: Obesity (BMI>30 kg/m2) Yes Sociodemographic characteristics (African American race, low socioeconomic level) Yes  Indications for early A1C (per uptodate) BMI >=25 (>=23 in Asian women) AND one of the following High-risk race/ethnicity (eg, African American, Latino, Native American, [place] American, [place]) Yes   Follow-up: Return in about 4 weeks (around 09/30/2020) for start Makena, LROB, in person, 2nd IT; start [place] next visit and continue weekly.   Orders Placed This Encounter  Procedures   Urine Culture   Integrated 1   Genetic Screening   Pain Management Screening Profile (10S)   CBC/D/Plt+RPR+Rh+ABO+RubIgG...   Hemoglobin A1c   POC Urinalysis Dipstick OB    Myrtis Ser [facility] 09/02/2020 11:58 AM

## 2020-09-02 NOTE — Progress Notes (Signed)
[place] 12 wks,measurements c/w dates,CRL 61.42 mm,normal ovaries,NB present,NT 1.3 mm,posterior placenta,fhr 148 bpm

## 2020-09-03 LAB — PMP SCREEN PROFILE (10S), URINE
Amphetamine Scrn, Ur: NEGATIVE ng/mL
BARBITURATE SCREEN URINE: NEGATIVE ng/mL
BENZODIAZEPINE SCREEN, URINE: NEGATIVE ng/mL
CANNABINOIDS UR QL SCN: POSITIVE ng/mL — AB
Cocaine (Metab) Scrn, Ur: NEGATIVE ng/mL
Creatinine(Crt), U: 160.9 mg/dL (ref 20.0–300.0)
Methadone Screen, Urine: NEGATIVE ng/mL
OXYCODONE+OXYMORPHONE UR QL SCN: NEGATIVE ng/mL
Opiate Scrn, Ur: NEGATIVE ng/mL
Ph of Urine: 6.4 (ref 4.5–8.9)
Phencyclidine Qn, Ur: NEGATIVE ng/mL
Propoxyphene Scrn, Ur: NEGATIVE ng/mL

## 2020-09-04 ENCOUNTER — Telehealth: Payer: Self-pay | Admitting: *Deleted

## 2020-09-04 LAB — CBC/D/PLT+RPR+RH+ABO+RUBIGG...
Antibody Screen: NEGATIVE
Basophils Absolute: 0 10*3/uL (ref 0.0–0.2)
Basos: 0 %
EOS (ABSOLUTE): 0.1 10*3/uL (ref 0.0–0.4)
Eos: 1 %
HCV Ab: <0.1 s/co ratio (ref 0.0–0.9)
HIV Screen 4th Generation wRfx: NONREACTIVE
Hematocrit: 39.3 % (ref 34.0–46.6)
Hemoglobin: 13.1 g/dL (ref 11.1–15.9)
Hepatitis B Surface Ag: NEGATIVE
Immature Grans (Abs): 0.1 10*3/uL (ref 0.0–0.1)
Immature Granulocytes: 1 %
Lymphocytes Absolute: 2.5 10*3/uL (ref 0.7–3.1)
Lymphs: 16 %
MCH: 31.8 pg (ref 26.6–33.0)
MCHC: 33.3 g/dL (ref 31.5–35.7)
MCV: 95 fL (ref 79–97)
Monocytes Absolute: 0.7 10*3/uL (ref 0.1–0.9)
Monocytes: 5 %
Neutrophils Absolute: 12 10*3/uL — ABNORMAL HIGH (ref 1.4–7.0)
Neutrophils: 77 %
Platelets: 240 10*3/uL (ref 150–450)
RBC: 4.12 x10E6/uL (ref 3.77–5.28)
RDW: 13.3 % (ref 11.7–15.4)
RPR Ser Ql: NONREACTIVE
Rh Factor: POSITIVE
Rubella Antibodies, IGG: 7.36 index (ref >0.99)
WBC: 15.5 10*3/uL — ABNORMAL HIGH (ref 3.4–10.8)

## 2020-09-04 LAB — INTEGRATED 1
Crown Rump Length: 61.4 mm
Gest. Age on Collection Date: 12.4 weeks
Maternal Age at EDD: 28.1 yr
Nuchal Translucency (NT): 1.3 mm
Number of Fetuses: 1
PAPP-A Value: 569.5 ng/mL
Weight: 247 [lb_av]

## 2020-09-04 LAB — CYTOLOGY - PAP
Chlamydia: NEGATIVE
Comment: NEGATIVE
Comment: NEGATIVE
Comment: NORMAL
Diagnosis: NEGATIVE
High risk HPV: NEGATIVE
Neisseria Gonorrhea: NEGATIVE

## 2020-09-04 LAB — HCV INTERPRETATION

## 2020-09-04 LAB — URINE CULTURE

## 2020-09-04 LAB — HEMOGLOBIN A1C
Est. average glucose Bld gHb Est-mCnc: 123 mg/dL
Hgb A1c MFr Bld: 5.9 % — ABNORMAL HIGH (ref 4.8–5.6)

## 2020-09-04 NOTE — Telephone Encounter (Signed)
Patient informed HgbA1C elevated at 5.9.  Will need early glucola.  States she is out of town for vacation next week but can come the first week of August.  Appt scheduled.  Advised nothing to eat or drink after midnight the night before.  Pt verbalized understanding.

## 2020-09-09 ENCOUNTER — Encounter: Payer: Self-pay | Admitting: Advanced Practice Midwife

## 2020-09-09 DIAGNOSIS — F1290 Cannabis use, unspecified, uncomplicated: Secondary | ICD-10-CM | POA: Insufficient documentation

## 2020-09-14 ENCOUNTER — Other Ambulatory Visit: Payer: Medicaid Other

## 2020-09-15 ENCOUNTER — Other Ambulatory Visit (INDEPENDENT_AMBULATORY_CARE_PROVIDER_SITE_OTHER): Payer: Medicaid Other | Admitting: *Deleted

## 2020-09-15 ENCOUNTER — Other Ambulatory Visit: Payer: Self-pay | Admitting: Women's Health

## 2020-09-15 ENCOUNTER — Other Ambulatory Visit: Payer: Self-pay

## 2020-09-15 DIAGNOSIS — R350 Frequency of micturition: Secondary | ICD-10-CM | POA: Diagnosis not present

## 2020-09-15 DIAGNOSIS — R309 Painful micturition, unspecified: Secondary | ICD-10-CM

## 2020-09-15 LAB — POCT URINALYSIS DIPSTICK OB
Glucose, UA: NEGATIVE
Nitrite, UA: NEGATIVE
POC,PROTEIN,UA: NEGATIVE

## 2020-09-15 MED ORDER — NITROFURANTOIN MONOHYD MACRO 100 MG PO CAPS: 100.0000 mg | ORAL_CAPSULE | Freq: Two times a day (BID) | ORAL | 0 refills | Status: DC

## 2020-09-15 NOTE — Progress Notes (Signed)
   NURSE VISIT- UTI SYMPTOMS   SUBJECTIVE:  Gina Snow is a 30 y.o. G76P0101 female here for UTI symptoms. She is 50w6dpregnant. She reports  pain with urination and urinary frequency .  OBJECTIVE:  LMP 06/10/2020 (Approximate)   Appears well, in no apparent distress  Results for orders placed or performed in visit on 09/15/20 (from the past 24 hour(s))  POC Urinalysis Dipstick OB   Collection Time: 09/15/20 10:04 AM  Result Value Ref Range   Color, UA     Clarity, UA     Glucose, UA Negative Negative   Bilirubin, UA     Ketones, UA trace    Spec Grav, UA     Blood, UA trace    pH, UA     POC,PROTEIN,UA Negative Negative, Trace, Small (1+), Moderate (2+), Large (3+), 4+   Urobilinogen, UA     Nitrite, UA neg    Leukocytes, UA Trace (A) Negative   Appearance     Odor      ASSESSMENT: Pregnancy 135w6dith UTI symptoms and negative nitrites  PLAN: Discussed with KiKnute NeuCNM, [facility] Rx sent by provider today: Yes Urine culture sent Call or return to clinic prn if these symptoms worsen or fail to improve as anticipated. Follow-up: as scheduled   JaLevy Pupa8/03/2020 10:09 AM  Chart reviewed for nurse visit. Agree with plan of care. Rx macrobid. [place]oma [place]/03/2020 10:10 AM

## 2020-09-17 ENCOUNTER — Other Ambulatory Visit: Payer: Self-pay

## 2020-09-17 DIAGNOSIS — R7309 Other abnormal glucose: Secondary | ICD-10-CM

## 2020-09-17 LAB — URINE CULTURE

## 2020-09-21 ENCOUNTER — Other Ambulatory Visit: Payer: Self-pay

## 2020-09-22 LAB — GLUCOSE TOLERANCE, 2 HOURS W/ 1HR
Glucose, 1 hour: 79 mg/dL (ref 65–179)
Glucose, 2 hour: 112 mg/dL (ref 65–152)
Glucose, Fasting: 81 mg/dL (ref 65–91)

## 2020-09-28 ENCOUNTER — Encounter: Payer: Self-pay | Admitting: Advanced Practice Midwife

## 2020-09-30 ENCOUNTER — Encounter: Payer: Medicaid Other | Admitting: Women's Health

## 2020-10-01 ENCOUNTER — Encounter: Payer: Self-pay | Admitting: Obstetrics & Gynecology

## 2020-10-01 ENCOUNTER — Other Ambulatory Visit: Payer: Self-pay

## 2020-10-01 ENCOUNTER — Ambulatory Visit (INDEPENDENT_AMBULATORY_CARE_PROVIDER_SITE_OTHER): Payer: Medicaid Other | Admitting: Obstetrics & Gynecology

## 2020-10-01 VITALS — BP 125/70 | HR 94 | Wt 256.8 lb

## 2020-10-01 DIAGNOSIS — Z3A16 16 weeks gestation of pregnancy: Secondary | ICD-10-CM | POA: Diagnosis not present

## 2020-10-01 DIAGNOSIS — Z8759 Personal history of other complications of pregnancy, childbirth and the puerperium: Secondary | ICD-10-CM | POA: Diagnosis not present

## 2020-10-01 DIAGNOSIS — O09892 Supervision of other high risk pregnancies, second trimester: Secondary | ICD-10-CM | POA: Diagnosis not present

## 2020-10-01 DIAGNOSIS — Z1379 Encounter for other screening for genetic and chromosomal anomalies: Secondary | ICD-10-CM

## 2020-10-01 MED ORDER — HYDROXYPROGESTERONE CAPROATE 275 MG/1.1ML ~~LOC~~ SOAJ
275.0000 mg | SUBCUTANEOUS | Status: AC
  Administered 2020-10-01 – 2021-02-17 (×12): 275 mg via SUBCUTANEOUS

## 2020-10-01 NOTE — Progress Notes (Signed)
[place] PREGNANCY VISIT Patient name: Gina Snow MRN [id number]  Date of birth: 1990/02/20 Chief Complaint:   Routine Prenatal Visit (2nd IT, Makena)  History of Present Illness:   Gina Snow is a 30 y.o. G23P0101 female at 94w1dwith an Estimated Date of Delivery: 03/17/21 being seen today for ongoing management of a high-risk pregnancy complicated by:  -h/o PPROM- on Makena weekly -tobacco use- cutting back this past week, she has not bought a new pack.    Today she reports no complaints.   Contractions: Not present. Vag. Bleeding: None.  Movement: Present. denies leaking of fluid.   [facility] 09/02/2020 10/03/2017 09/17/2012  Decreased Interest 1 0 0  Down, Depressed, Hopeless 2 0 1  PHQ - 2 Score 3 0 1  Altered sleeping 1 1 -  Tired, decreased energy 1 2 -  Change in appetite 0 1 -  Feeling bad or failure about yourself  0 0 -  Trouble concentrating 0 0 -  Moving slowly or fidgety/restless 0 0 -  Suicidal thoughts 0 0 -  PHQ-9 Score 5 4 -     Current Outpatient Medications  Medication Instructions   acetaminophen (TYLENOL) 500 mg, Oral, Every 6 hours PRN   albuterol (PROVENTIL HFA;VENTOLIN HFA) 108 (90 Base) MCG/ACT inhaler 1-2 puffs, Inhalation, Every 6 hours PRN   aspirin 162 mg, Oral, Daily   Prenat w/o A-FeCbGl-DSS-FA-DHA (CITRANATAL ASSURE) 35-1 & 300 MG tablet One tablet and one capsule daily     Review of Systems:   Pertinent items are noted in HPI Denies abnormal vaginal discharge w/ itching/odor/irritation, headaches, visual changes, shortness of breath, chest pain, abdominal pain, severe nausea/vomiting, or problems with urination or bowel movements unless otherwise stated above. Pertinent History Reviewed:  Reviewed past medical,surgical, social, obstetrical and family history.  Reviewed problem list, medications and allergies. Physical Assessment:   Vitals:   10/01/20 1122  BP: 125/70  Pulse: 94  Weight: 256 lb 12.8 oz  (116.5 kg)  Body mass index is 44.08 kg/m.           Physical Examination:   General appearance: alert, well appearing, and in no distress  Mental status: alert, oriented to person, place, and time  Skin: warm & dry   Extremities: Edema: None    Cardiovascular: normal heart rate noted  Respiratory: normal respiratory effort, no distress  Abdomen: gravid, soft, non-tender  Pelvic: Cervical exam deferred         Fetal Status: Fetal Heart Rate (bpm): 155   Movement: Present    Fetal Surveillance Testing today: doppler   Chaperone: N/A    No results found for this or any previous visit (from the past 24 hour(s)).   Assessment & Plan:  High-risk pregnancy: G2P0101 at 154w1dith an Estimated Date of Delivery: 03/17/21   1) PPROM- continue with Makena weekly   Meds:  Meds ordered this encounter  Medications   HYDROXYprogesterone caproate (Makena) autoinjector 275 mg    Labs/procedures today: IT-2 today  Treatment Plan:  continue routine prenatal care, anatomy scan next visit  Reviewed: Preterm labor symptoms and general obstetric precautions including but not limited to vaginal bleeding, contractions, leaking of fluid and fetal movement were reviewed in detail with the patient.  All questions were answered. Pt has home bp cuff. Check bp weekly, let usKoreanow if >140/90.   Follow-up: Return in about 4 weeks (around 10/29/2020) for LRChrisneyisit and anatomy scan.   Future Appointments  Date  Time Provider [place]  10/07/2020  9:50 AM [facility] NURSE [facility]TOBGYN  10/14/2020  9:30 AM [facility] NURSE [facility]  10/21/2020 10:10 AM [facility] NURSE [facility]TO[facility]YN  10/28/2020  9:30 AM [facility]FTOBGYN NURSE [facility]TOBGYN  11/04/2020 10:10 AM [facility] NURSE [facility]TOBGYN  11/11/2020  9:30 AM [facility]GYN NURSE [facility]TO[facility]  11/18/2020  9:50 AM [facility] NURSE [facility]TO[facility]  11/25/2020  9:30 AM [facility] NURSE [facility]  12/02/2020  9:30 AM [facility]FT[facility]GYN NURSE  [facility] FTOBGYN  12/09/2020  9:30 AM [facility] NURSE [facility]TO[facility]  12/16/2020  9:30 AM [facility] NURSE [facility]TO[facility]  12/23/2020  9:30 AM [facility] NURSE [facility]TO[facility]  12/30/2020  9:30 AM [facility] NURSE [facility]TO[facility]  01/06/2021  9:30 AM [facility]FT[facility]GYN NURSE [facility]TO[facility]  01/13/2021  9:30 AM [facility] NURSE [facility]TO[facility]  01/20/2021  9:30 AM [facility] NURSE [facility]TO[facility]  01/27/2021  9:30 AM [facility] NURSE [facility]  02/03/2021  9:30 AM [facility] NURSE [facility]  02/10/2021  9:30 AM [facility] NURSE [facility]  02/17/2021  9:30 AM [facility] NURSE [facility]TO[facility]    Orders Placed This Encounter  Procedures   INTEGRATED 2    Janyth Pupa, DO Attending [place], [place] for [employer], [place] [facility]

## 2020-10-03 LAB — INTEGRATED 2
AFP MoM: 0.78
Alpha-Fetoprotein: 20 ng/mL
Crown Rump Length: 61.4 mm
DIA MoM: 1.45
DIA Value: 166.6 pg/mL
Estriol, Unconjugated: 0.56 ng/mL
Gest. Age on Collection Date: 12.4 weeks
Gestational Age: 16.6 weeks
Maternal Age at EDD: 28.1 yr
Nuchal Translucency (NT): 1.3 mm
Nuchal Translucency MoM: 0.95
Number of Fetuses: 1
PAPP-A MoM: 1.1
PAPP-A Value: 569.5 ng/mL
Test Results:: NEGATIVE
Weight: 247 [lb_av]
Weight: 247 [lb_av]
hCG MoM: 0.92
hCG Value: 20.5 IU/mL
uE3 MoM: 0.59

## 2020-10-07 ENCOUNTER — Ambulatory Visit (INDEPENDENT_AMBULATORY_CARE_PROVIDER_SITE_OTHER): Payer: Medicaid Other

## 2020-10-07 ENCOUNTER — Other Ambulatory Visit: Payer: Self-pay

## 2020-10-07 VITALS — BP 114/70 | HR 88 | Wt 256.6 lb

## 2020-10-07 DIAGNOSIS — Z8751 Personal history of pre-term labor: Secondary | ICD-10-CM

## 2020-10-07 MED ORDER — HYDROXYPROGESTERONE CAPROATE 275 MG/1.1ML ~~LOC~~ SOAJ
275.0000 mg | Freq: Once | SUBCUTANEOUS | Status: AC
  Administered 2020-10-07: 275 mg via SUBCUTANEOUS

## 2020-10-07 NOTE — Progress Notes (Signed)
   NURSE VISIT- INJECTION  SUBJECTIVE:  Gina Snow is a 30 y.o. G79P0101 female here for a Makena for history of preterm birth. She is 77w0dpregnant.   OBJECTIVE:  BP 114/70   Pulse 88   Wt 256 lb 9.6 oz (116.4 kg)   LMP 06/10/2020 (Approximate)   BMI 44.05 kg/m   Appears well, in no apparent distress  Injection administered in: Left arm per pt request  Meds ordered this encounter  Medications   HYDROXYprogesterone caproate (Makena) autoinjector 275 mg    ASSESSMENT: Pregnancy 144w0dakena for history of preterm birth PLAN: Follow-up: in 1 week for next Makena   Blondie Riggsbee A Rambo Sarafian  10/07/2020 9:59 AM

## 2020-10-14 ENCOUNTER — Other Ambulatory Visit: Payer: Self-pay

## 2020-10-14 ENCOUNTER — Ambulatory Visit (INDEPENDENT_AMBULATORY_CARE_PROVIDER_SITE_OTHER): Payer: Medicaid Other

## 2020-10-14 DIAGNOSIS — Z3A18 18 weeks gestation of pregnancy: Secondary | ICD-10-CM | POA: Diagnosis not present

## 2020-10-14 DIAGNOSIS — Z8751 Personal history of pre-term labor: Secondary | ICD-10-CM

## 2020-10-14 MED ORDER — HYDROXYPROGESTERONE CAPROATE 275 MG/1.1ML ~~LOC~~ SOAJ
275.0000 mg | Freq: Once | SUBCUTANEOUS | Status: AC
  Administered 2020-10-14: 275 mg via SUBCUTANEOUS

## 2020-10-14 NOTE — Progress Notes (Signed)
   NURSE VISIT- INJECTION  SUBJECTIVE:  Gina Snow is a 30 y.o. G91P0101 female here for a Makena for history of preterm birth. She is 29w0dpregnant.   OBJECTIVE:  LMP 06/10/2020 (Approximate)   Appears well, in no apparent distress  Injection administered in: Right arm  Meds ordered this encounter  Medications   HYDROXYprogesterone caproate (Makena) autoinjector 275 mg    ASSESSMENT: Pregnancy 126w0dakena for history of preterm birth PLAN: Follow-up: in 1 week for next Makena   Gina Snow  10/14/2020 10:02 AM

## 2020-10-21 ENCOUNTER — Ambulatory Visit: Payer: Medicaid Other

## 2020-10-21 ENCOUNTER — Other Ambulatory Visit: Payer: Self-pay

## 2020-10-21 ENCOUNTER — Ambulatory Visit (INDEPENDENT_AMBULATORY_CARE_PROVIDER_SITE_OTHER): Payer: Medicaid Other

## 2020-10-21 DIAGNOSIS — Z3A19 19 weeks gestation of pregnancy: Secondary | ICD-10-CM

## 2020-10-21 DIAGNOSIS — Z8751 Personal history of pre-term labor: Secondary | ICD-10-CM

## 2020-10-21 MED ORDER — HYDROXYPROGESTERONE CAPROATE 275 MG/1.1ML ~~LOC~~ SOAJ
275.0000 mg | Freq: Once | SUBCUTANEOUS | Status: AC
  Administered 2020-10-21: 275 mg via SUBCUTANEOUS

## 2020-10-21 NOTE — Progress Notes (Signed)
   NURSE VISIT- INJECTION  SUBJECTIVE:  MALANEY ORT is a 30 y.o. G99P0101 female here for a Makena for history of preterm birth. She is 70w0dpregnant.   OBJECTIVE:  LMP 06/10/2020 (Approximate)   Appears well, in no apparent distress  Injection administered in: Left arm  Meds ordered this encounter  Medications   HYDROXYprogesterone caproate (Makena) autoinjector 275 mg    ASSESSMENT: Pregnancy 132w0dakena for history of preterm birth PLAN: Follow-up: in 1 week for next Makena   Lanah Steines A Pailyn [place]  10/21/2020 11:06 AM

## 2020-10-27 ENCOUNTER — Other Ambulatory Visit: Payer: Self-pay | Admitting: Obstetrics & Gynecology

## 2020-10-27 DIAGNOSIS — Z363 Encounter for antenatal screening for malformations: Secondary | ICD-10-CM

## 2020-10-28 ENCOUNTER — Ambulatory Visit: Payer: Medicaid Other

## 2020-10-28 ENCOUNTER — Ambulatory Visit (INDEPENDENT_AMBULATORY_CARE_PROVIDER_SITE_OTHER): Payer: Medicaid Other | Admitting: Women's Health

## 2020-10-28 ENCOUNTER — Ambulatory Visit (INDEPENDENT_AMBULATORY_CARE_PROVIDER_SITE_OTHER): Payer: Medicaid Other

## 2020-10-28 ENCOUNTER — Encounter: Payer: Self-pay | Admitting: Women's Health

## 2020-10-28 ENCOUNTER — Other Ambulatory Visit: Payer: Self-pay

## 2020-10-28 VITALS — BP 132/78 | HR 80 | Wt 265.0 lb

## 2020-10-28 DIAGNOSIS — Z8751 Personal history of pre-term labor: Secondary | ICD-10-CM

## 2020-10-28 DIAGNOSIS — Z23 Encounter for immunization: Secondary | ICD-10-CM | POA: Diagnosis not present

## 2020-10-28 DIAGNOSIS — Z3480 Encounter for supervision of other normal pregnancy, unspecified trimester: Secondary | ICD-10-CM

## 2020-10-28 DIAGNOSIS — Z3A20 20 weeks gestation of pregnancy: Secondary | ICD-10-CM

## 2020-10-28 DIAGNOSIS — Z363 Encounter for antenatal screening for malformations: Secondary | ICD-10-CM

## 2020-10-28 DIAGNOSIS — Z3482 Encounter for supervision of other normal pregnancy, second trimester: Secondary | ICD-10-CM

## 2020-10-28 DIAGNOSIS — F1290 Cannabis use, unspecified, uncomplicated: Secondary | ICD-10-CM

## 2020-10-28 NOTE — Progress Notes (Signed)
LOW-RISK PREGNANCY VISIT Patient name: Gina Snow MRN [id number]  Date of birth: [birth year] Chief Complaint:   Routine Prenatal Visit (Anatomy scan)  History of Present Illness:   Gina Snow is a 30 y.o. G40P0101 female at 97w0dwith an Estimated Date of Delivery: 03/17/21 being seen today for ongoing management of a low-risk pregnancy.   Today she reports no complaints. Contractions: Not present. Vag. Bleeding: None.  Movement: Present. denies leaking of fluid.  [facility] 09/02/2020 10/03/2017 09/17/2012  Decreased Interest 1 0 0  Down, Depressed, Hopeless 2 0 1  PHQ - 2 Score 3 0 1  Altered sleeping 1 1 -  Tired, decreased energy 1 2 -  Change in appetite 0 1 -  Feeling bad or failure about yourself  0 0 -  Trouble concentrating 0 0 -  Moving slowly or fidgety/restless 0 0 -  Suicidal thoughts 0 0 -  PHQ-9 Score 5 4 -     GAD 7 : Generalized Anxiety Score 09/02/2020  Nervous, Anxious, on Edge 0  Control/stop worrying 1  Worry too much - different things 0  Trouble relaxing 1  Restless 0  Easily annoyed or irritable 0  Afraid - awful might happen 0  Total GAD 7 Score 2      Review of Systems:   Pertinent items are noted in HPI Denies abnormal vaginal discharge w/ itching/odor/irritation, headaches, visual changes, shortness of breath, chest pain, abdominal pain, severe nausea/vomiting, or problems with urination or bowel movements unless otherwise stated above. Pertinent History Reviewed:  Reviewed past medical,surgical, social, obstetrical and family history.  Reviewed problem list, medications and allergies. Physical Assessment:   Vitals:   10/28/20 1043  BP: 132/78  Pulse: 80  Weight: 265 lb (120.2 kg)  Body mass index is 45.49 kg/m.        Physical Examination:   General appearance: Well appearing, and in no distress  Mental status: Alert, oriented to person, place, and time  Skin: Warm & dry  Cardiovascular: Normal heart rate  noted  Respiratory: Normal respiratory effort, no distress  Abdomen: Soft, gravid, nontender  Pelvic: Cervical exam deferred         Extremities: Edema: None  Fetal Status: Fetal Heart Rate (bpm): 164 u/s   Movement: Present  [place]20 wks,breech,cx 4.4 cm,posterior placenta gr 0,normal ovaries,svp of fluid 4.9 cm,fhr 164 BPM,EFW 370 g 81%,anatomy complete,no obvious abnormalities   Chaperone: N/A   No results found for this or any previous visit (from the past 24 hour(s)).  Assessment & Plan:  1) Low-risk pregnancy G2P0101 at 291w0dith an Estimated Date of Delivery: 03/17/21   2) H/O 34wk PTB, weekly Makena   Meds: No orders of the defined types were placed in this encounter.  Labs/procedures today: flu shot, Makena, and U/S  Plan:  Continue routine obstetrical care  Next visit: prefers in person    Reviewed: Preterm labor symptoms and general obstetric precautions including but not limited to vaginal bleeding, contractions, leaking of fluid and fetal movement were reviewed in detail with the patient.  All questions were answered. Does have home bp cuff. Office bp cuff given: not applicable. Check bp weekly, let usKoreanow if consistently >140 and/or >90.  Follow-up: Return in about 4 weeks (around 11/25/2020) for LROB, CNM, in person, Weekly 17P.  Future Appointments  Date Time Provider [place]9/21/2022 10:10 AM [facility] NURSE [facility]  11/11/2020  9:30 AM [facility] NURSE [facility]  11/18/2020  9:50 AM [facility] NURSE [facility]  11/25/2020  9:30 AM [facility] NURSE [facility]TO[facility]  12/02/2020  9:30 AM [facility]GY[facility] NURSE [facility] FTOBGYN  12/09/2020  9:30 AM [facility] NURSE [facility]  12/16/2020  9:30 AM [facility] NURSE [facility]  12/23/2020  9:30 AM [facility]GY[facility] NURSE [facility]  12/30/2020  9:30 AM [facility]GY[facility] NURSE [facility]TO[facility]  01/06/2021  9:30 AM [facility]GY[facility] NURSE [facility]  01/13/2021  9:30 AM [facility]GY[facility] NURSE [facility]   01/20/2021  9:30 AM [facility]GYN NURSE [facility]TO[facility]  01/27/2021  9:30 AM [facility]GY[facility] NURSE [facility]TO[facility]  02/03/2021  9:30 AM [facility]GY[facility] NURSE [facility]  02/10/2021  9:30 AM [facility] NURSE [facility]BG[facility]  02/17/2021  9:30 AM [facility] NURSE [facility]    No orders of the defined types were placed in this encounter.  [place], [facility] 10/28/2020 11:02 AM

## 2020-10-28 NOTE — Progress Notes (Signed)
[place] 20 wks,breech,cx 4.4 cm,posterior placenta gr 0,normal ovaries,svp of fluid 4.9 cm,fhr 164 BPM,EFW 370 g 81%,anatomy complete,no obvious abnormalities

## 2020-10-28 NOTE — Patient Instructions (Signed)
Gina Snow, thank you for choosing our office today! We appreciate the opportunity to meet your healthcare needs. You may receive a short survey by mail, e-mail, or through [employer]. If you are happy with your care we would appreciate if you could take just a few minutes to complete the survey questions. We read all of your comments and take your feedback very seriously. Thank you again for choosing our office.  Center for [employer] Team at [place] at [facility] ([place], [place] [address]) Entrance C, located off of [place] parking  Go to [email or web address] to register for FREE online childbirth classes  Call the office [phone]) or go to [facility] if: You begin to severe cramping Your water breaks.  Sometimes it is a big gush of fluid, sometimes it is just a trickle that keeps getting your panties wet or running down your legs You have vaginal bleeding.  It is normal to have a small amount of spotting if your cervix was checked.   [facility] Pediatricians/Family Doctors [facility]): [address] [place], [facility]: [address] [place], [phone]                [facility]): [place], [phone] (call to ask if accepting patients) [facility] Department: [address], [place], [place] Pediatricians/Family Doctors [facility]): [place] [place], [address], [place] [facility]: [address] [place], [place] [facility]: [place], [place] [facility]): [phone] [facility]: [address] [place]: [address], [place] [facility]: [phone], [phone]  Home Blood Pressure Monitoring for Patients   Your provider has recommended that you check your blood pressure (BP) at least once a week at home. If you do not have a blood pressure cuff at home, one will be provided for you. Contact your provider if you have not received your monitor within 1 week.   Helpful Tips for Accurate Home Blood Pressure Checks  Don't smoke, exercise, or drink caffeine 30 minutes before checking your BP Use the restroom before checking your BP (a full bladder can raise your pressure) Relax in a comfortable upright chair Feet on the ground Left arm resting comfortably on a flat surface at the level of your heart Legs uncrossed Back supported Sit quietly and don't talk Place the cuff on your bare arm Adjust snuggly, so that only two fingertips can fit between your skin and the top of the cuff Check 2 readings separated by at least one minute Keep a log of your BP readings For a visual, please reference this diagram: http://ccnc.care/bpdiagram  Provider Name: [facility]     Phone: [phone]  Zone 1: ALL CLEAR  Continue to monitor your symptoms:  BP reading is less than 140 (top number) or less than 90 (bottom number)  No right upper stomach pain No headaches or seeing spots No feeling nauseated or throwing up No swelling in face and hands  Zone 2: CAUTION Call your doctor's office for any of the following:  BP reading is greater than 140 (top number) or greater than  90 (bottom number)  Stomach pain under your ribs in the middle or right side Headaches or seeing spots Feeling nauseated or throwing up Swelling in face and hands  Zone 3: EMERGENCY  Seek immediate medical care if you have any of the following:  BP reading is greater than160 (top number) or greater than 110 (bottom number) Severe headaches not improving with Tylenol Serious difficulty catching your breath Any worsening symptoms from  Zone 2     Second Trimester of Pregnancy The second trimester is from week 14 through week 27 (months 4 through 6). The second trimester is often a time when you feel your best. Your body has adjusted to being pregnant, and you begin to feel better physically. Usually, morning sickness has lessened or quit completely, you may have more energy, and you may have an increase in appetite. The second trimester is also a time when the fetus is growing rapidly. At the end of the sixth month, the fetus is about 9 inches long and weighs about 1 pounds. You will likely begin to feel the baby move (quickening) between 16 and 20 weeks of pregnancy. Body changes during your second trimester Your body continues to go through many changes during your second trimester. The changes vary from woman to woman. Your weight will continue to increase. You will notice your lower abdomen bulging out. You may begin to get stretch marks on your hips, abdomen, and breasts. You may develop headaches that can be relieved by medicines. The medicines should be approved by your health care provider. You may urinate more often because the fetus is pressing on your bladder. You may develop or continue to have heartburn as a result of your pregnancy. You may develop constipation because certain hormones are causing the muscles that push waste through your intestines to slow down. You may develop hemorrhoids or swollen, bulging veins (varicose veins). You may have back pain. This is caused by: Weight gain. Pregnancy hormones that are relaxing the joints in your pelvis. A shift in weight and the muscles that support your balance. Your breasts will continue to grow and they will continue to become tender. Your gums may bleed and may be sensitive to brushing and flossing. Dark spots or blotches (chloasma, mask of pregnancy) may develop on your face. This will likely fade after the baby is born. A dark line from your belly button to  the pubic area (linea nigra) may appear. This will likely fade after the baby is born. You may have changes in your hair. These can include thickening of your hair, rapid growth, and changes in texture. Some women also have hair loss during or after pregnancy, or hair that feels dry or thin. Your hair will most likely return to normal after your baby is born.  What to expect at prenatal visits During a routine prenatal visit: You will be weighed to make sure you and the fetus are growing normally. Your blood pressure will be taken. Your abdomen will be measured to track your baby's growth. The fetal heartbeat will be listened to. Any test results from the previous visit will be discussed.  Your health care provider may ask you: How you are feeling. If you are feeling the baby move. If you have had any abnormal symptoms, such as leaking fluid, bleeding, severe headaches, or abdominal cramping. If you are using any tobacco products, including cigarettes, chewing tobacco, and electronic cigarettes. If you have any questions.  Other tests that may be performed during   your second trimester include: Blood tests that check for: Low iron levels (anemia). High blood sugar that affects pregnant women (gestational diabetes) between 41 and 28 weeks. Rh antibodies. This is to check for a protein on red blood cells (Rh factor). Urine tests to check for infections, diabetes, or protein in the urine. An ultrasound to confirm the proper growth and development of the baby. An amniocentesis to check for possible genetic problems. Fetal screens for spina bifida and Down syndrome. HIV (human immunodeficiency virus) testing. Routine prenatal testing includes screening for HIV, unless you choose not to have this test.  Follow these instructions at home: Medicines Follow your health care provider's instructions regarding medicine use. Specific medicines may be either safe or unsafe to take during  pregnancy. Take a prenatal vitamin that contains at least 600 micrograms (mcg) of folic acid. If you develop constipation, try taking a stool softener if your health care provider approves. Eating and drinking Eat a balanced diet that includes fresh fruits and vegetables, whole grains, good sources of protein such as meat, eggs, or tofu, and low-fat dairy. Your health care provider will help you determine the amount of weight gain that is right for you. Avoid raw meat and uncooked cheese. These carry germs that can cause birth defects in the baby. If you have low calcium intake from food, talk to your health care provider about whether you should take a daily calcium supplement. Limit foods that are high in fat and processed sugars, such as fried and sweet foods. To prevent constipation: Drink enough fluid to keep your urine clear or pale yellow. Eat foods that are high in fiber, such as fresh fruits and vegetables, whole grains, and beans. Activity Exercise only as directed by your health care provider. Most women can continue their usual exercise routine during pregnancy. Try to exercise for 30 minutes at least 5 days a week. Stop exercising if you experience uterine contractions. Avoid heavy lifting, wear low heel shoes, and practice good posture. A sexual relationship may be continued unless your health care provider directs you otherwise. Relieving pain and discomfort Wear a good support bra to prevent discomfort from breast tenderness. Take warm sitz baths to soothe any pain or discomfort caused by hemorrhoids. Use hemorrhoid cream if your health care provider approves. Rest with your legs elevated if you have leg cramps or low back pain. If you develop varicose veins, wear support hose. Elevate your feet for 15 minutes, 3-4 times a day. Limit salt in your diet. Prenatal Care Write down your questions. Take them to your prenatal visits. Keep all your prenatal visits as told by your health  care provider. This is important. Safety Wear your seat belt at all times when driving. Make a list of emergency phone numbers, including numbers for family, friends, the hospital, and police and fire departments. General instructions Ask your health care provider for a referral to a local prenatal education class. Begin classes no later than the beginning of month 6 of your pregnancy. Ask for help if you have counseling or nutritional needs during pregnancy. Your health care provider can offer advice or refer you to specialists for help with various needs. Do not use hot tubs, steam rooms, or saunas. Do not douche or use tampons or scented sanitary pads. Do not cross your legs for long periods of time. Avoid cat litter boxes and soil used by cats. These carry germs that can cause birth defects in the baby and possibly loss of the  fetus by miscarriage or stillbirth. Avoid all smoking, herbs, alcohol, and unprescribed drugs. Chemicals in these products can affect the formation and growth of the baby. Do not use any products that contain nicotine or tobacco, such as cigarettes and e-cigarettes. If you need help quitting, ask your health care provider. Visit your dentist if you have not gone yet during your pregnancy. Use a soft toothbrush to brush your teeth and be gentle when you floss. Contact a health care provider if: You have dizziness. You have mild pelvic cramps, pelvic pressure, or nagging pain in the abdominal area. You have persistent nausea, vomiting, or diarrhea. You have a bad smelling vaginal discharge. You have pain when you urinate. Get help right away if: You have a fever. You are leaking fluid from your vagina. You have spotting or bleeding from your vagina. You have severe abdominal cramping or pain. You have rapid weight gain or weight loss. You have shortness of breath with chest pain. You notice sudden or extreme swelling of your face, hands, ankles, feet, or legs. You  have not felt your baby move in over an hour. You have severe headaches that do not go away when you take medicine. You have vision changes. Summary The second trimester is from week 14 through week 27 (months 4 through 6). It is also a time when the fetus is growing rapidly. Your body goes through many changes during pregnancy. The changes vary from woman to woman. Avoid all smoking, herbs, alcohol, and unprescribed drugs. These chemicals affect the formation and growth your baby. Do not use any tobacco products, such as cigarettes, chewing tobacco, and e-cigarettes. If you need help quitting, ask your health care provider. Contact your health care provider if you have any questions. Keep all prenatal visits as told by your health care provider. This is important. This information is not intended to replace advice given to you by your health care provider. Make sure you discuss any questions you have with your health care provider. Document Released: 01/25/2001 Document Revised: 07/09/2015 Document Reviewed: 04/03/2012 Elsevier Interactive Patient Education  2017 [employer].

## 2020-11-04 ENCOUNTER — Other Ambulatory Visit: Payer: Self-pay

## 2020-11-04 ENCOUNTER — Ambulatory Visit (INDEPENDENT_AMBULATORY_CARE_PROVIDER_SITE_OTHER): Payer: Medicaid Other

## 2020-11-04 DIAGNOSIS — Z8751 Personal history of pre-term labor: Secondary | ICD-10-CM

## 2020-11-04 DIAGNOSIS — Z3A21 21 weeks gestation of pregnancy: Secondary | ICD-10-CM

## 2020-11-04 MED ORDER — HYDROXYPROGESTERONE CAPROATE 275 MG/1.1ML ~~LOC~~ SOAJ
275.0000 mg | Freq: Once | SUBCUTANEOUS | Status: AC
  Administered 2020-11-04: 275 mg via SUBCUTANEOUS

## 2020-11-04 NOTE — Progress Notes (Signed)
   NURSE VISIT- INJECTION  SUBJECTIVE:  THEODOSIA BAHENA is a 30 y.o. G81P0101 female here for a Makena for history of preterm birth. She is [redacted]w[redacted]d pregnant.   OBJECTIVE:  LMP 06/10/2020 (Approximate)   Appears well, in no apparent distress  Injection administered in: Left arm  Meds ordered this encounter  Medications   HYDROXYprogesterone caproate (Makena) autoinjector 275 mg    ASSESSMENT: Pregnancy [redacted]w[redacted]d Makena for history of preterm birth PLAN: Follow-up: in 1 week for next Makena   Zaniel Marineau A Navea Woodrow  11/04/2020 10:18 AM

## 2020-11-10 ENCOUNTER — Other Ambulatory Visit: Payer: Self-pay

## 2020-11-10 ENCOUNTER — Ambulatory Visit (INDEPENDENT_AMBULATORY_CARE_PROVIDER_SITE_OTHER): Payer: Medicaid Other

## 2020-11-10 ENCOUNTER — Telehealth: Payer: Self-pay

## 2020-11-10 VITALS — BP 137/65 | HR 90 | Wt 269.0 lb

## 2020-11-10 DIAGNOSIS — Z8751 Personal history of pre-term labor: Secondary | ICD-10-CM | POA: Diagnosis not present

## 2020-11-10 DIAGNOSIS — Z0130 Encounter for examination of blood pressure without abnormal findings: Secondary | ICD-10-CM

## 2020-11-10 DIAGNOSIS — Z1389 Encounter for screening for other disorder: Secondary | ICD-10-CM

## 2020-11-10 LAB — POCT URINALYSIS DIPSTICK OB
Blood, UA: NEGATIVE
Glucose, UA: NEGATIVE
Ketones, UA: NEGATIVE
Leukocytes, UA: NEGATIVE
Nitrite, UA: NEGATIVE
POC,PROTEIN,UA: NEGATIVE

## 2020-11-10 MED ORDER — HYDROXYPROGESTERONE CAPROATE 275 MG/1.1ML ~~LOC~~ SOAJ
275.0000 mg | Freq: Once | SUBCUTANEOUS | Status: AC
  Administered 2020-11-10: 275 mg via SUBCUTANEOUS

## 2020-11-10 NOTE — Telephone Encounter (Signed)
Pt called with c/o tingling in arms, hands, and fingers. Pt stated that she's been having migraines ever since she got pregnant, but has not told us about them. She has been taking extra strength Tylenol around the clock as directed which helps, but she still has headaches. She sees spots with migraines. She has not had any N&V in the past few months. She describes the tingling and numbness in both arms and it is unrelated to her Makena injections. She started having a sharp intermittent pain starting in her fingers and going up into her arm, stopping at her elbow on her right side. Pt has not been taking her BP at home. She states that she is unsure of where her cuff is located. Pt scheduled for a nurse visit BP check today at 1130.

## 2020-11-10 NOTE — Progress Notes (Addendum)
   NURSE VISIT- BLOOD PRESSURE CHECK  SUBJECTIVE:  Gina Snow is a 30 y.o. G55P0101 female here for BP check. She is [redacted]w[redacted]d pregnant    HYPERTENSION ROS:  Pregnant:  Severe headaches that don't go away with tylenol/other medicines:  Yes, but improve with Tylenol Visual changes (seeing spots/double/blurred vision)  stars with migraines Severe pain under right breast breast or in center of upper chest No  Severe nausea/vomiting No  Taking medicines as instructed no  OBJECTIVE:  BP 137/65   Pulse 90   Wt 269 lb (122 kg)   LMP 06/10/2020 (Approximate)   BMI 46.17 kg/m   Appearance alert, well appearing, and in no distress and oriented to person, place, and time.  ASSESSMENT: Pregnancy [redacted]w[redacted]d  blood pressure check  PLAN: Discussed with Knute Neu, CNM, [facility]   Recommendations: no changes needed   Follow-up: as scheduled   Merced Hanners A Issac Moure  11/10/2020 11:33 AM   Chart reviewed for nurse visit. Agree with plan of care.  Roma Schanz, [place] 11/10/2020 12:52 PM

## 2020-11-10 NOTE — Progress Notes (Addendum)
   NURSE VISIT- INJECTION  SUBJECTIVE:  Gina Snow is a 30 y.o. G36P0101 female here for a Makena for history of preterm birth. She is [redacted]w[redacted]d pregnant.   OBJECTIVE:  BP 137/65   Pulse 90   Wt 269 lb (122 kg)   LMP 06/10/2020 (Approximate)   BMI 46.17 kg/m   Appears well, in no apparent distress  Injection administered in: Right arm  Meds ordered this encounter  Medications   HYDROXYprogesterone caproate (Makena) autoinjector 275 mg    ASSESSMENT: Pregnancy [redacted]w[redacted]d Makena for history of preterm birth PLAN: Follow-up: in 1 week for next Makena   Hasten Sweitzer A Ziyon Cedotal  11/10/2020 11:48 AM  Chart reviewed for nurse visit. Agree with plan of care.  Roma Schanz, [place] 11/10/2020 12:51 PM

## 2020-11-11 ENCOUNTER — Ambulatory Visit: Payer: Medicaid Other

## 2020-11-18 ENCOUNTER — Encounter: Payer: Self-pay | Admitting: Emergency Medicine

## 2020-11-18 ENCOUNTER — Ambulatory Visit
Admission: EM | Admit: 2020-11-18 | Discharge: 2020-11-18 | Disposition: A | Discharge status: Home / Self Care | Payer: Medicaid Other | Attending: Family Medicine | Admitting: Family Medicine

## 2020-11-18 ENCOUNTER — Other Ambulatory Visit: Payer: Self-pay

## 2020-11-18 ENCOUNTER — Ambulatory Visit: Payer: Medicaid Other

## 2020-11-18 DIAGNOSIS — N644 Mastodynia: Secondary | ICD-10-CM

## 2020-11-18 MED ORDER — CEFDINIR 300 MG PO CAPS: 300.0000 mg | ORAL_CAPSULE | Freq: Two times a day (BID) | ORAL | 0 refills | Status: DC

## 2020-11-18 NOTE — ED Triage Notes (Signed)
Pt here for left nipple pain and swelling x 3 weeks worse over last few days; pt is 6 months pregnant

## 2020-11-19 ENCOUNTER — Ambulatory Visit (INDEPENDENT_AMBULATORY_CARE_PROVIDER_SITE_OTHER): Payer: Medicaid Other

## 2020-11-19 VITALS — BP 129/81 | HR 101 | Wt 271.0 lb

## 2020-11-19 DIAGNOSIS — Z8751 Personal history of pre-term labor: Secondary | ICD-10-CM | POA: Diagnosis not present

## 2020-11-19 DIAGNOSIS — Z3A23 23 weeks gestation of pregnancy: Secondary | ICD-10-CM | POA: Diagnosis not present

## 2020-11-19 NOTE — Progress Notes (Signed)
   NURSE VISIT- INJECTION  SUBJECTIVE:  Gina Snow is a 30 y.o. G57P0101 female here for a Makena for history of preterm birth. She is [redacted]w[redacted]d pregnant.   OBJECTIVE:  BP 129/81   Pulse (!) 101   Wt 271 lb (122.9 kg)   LMP 06/10/2020 (Approximate)   BMI 46.52 kg/m   Appears well, in no apparent distress  Injection administered in: Right arm  No orders of the defined types were placed in this encounter.   ASSESSMENT: Pregnancy [redacted]w[redacted]d Makena for history of preterm birth PLAN: Follow-up: in 1 week for next Makena   Gina Snow A Dalyn Kjos  11/19/2020 11:32 AM

## 2020-11-21 NOTE — ED Provider Notes (Addendum)
Gina Snow   [id number] 11/18/20 Arrival Time: [place]:  1. Nipple pain    No sign of abscess formation. Begin: Meds ordered this encounter  Medications   cefdinir (OMNICEF) 300 MG capsule    Sig: Take 1 capsule (300 mg total) by mouth 2 (two) times daily.    Dispense:  14 capsule    Refill:  0   Will keep OB follow up. If worsens may need local U/S of L nipple.   Follow-up Information     [facility] at [facility].   Specialty: Urgent Care Why: If worsening or failing to improve as anticipated. Contact information: [address], [place] [address] [phone]                Reviewed expectations re: course of current medical issues. Questions answered. Outlined signs and symptoms indicating need for more acute intervention. Understanding verbalized. After Visit Summary given.   SUBJECTIVE: History from: patient. Gina Snow is a 30 y.o. female who reports L nipple pain; gradual; x 3 weeks; worse over past few d; no d/c or bleeding. Is 6 mos pregnant. Denies: fever. Normal PO intake without n/v/d.   OBJECTIVE:  Vitals:   11/18/20 1848  BP: 121/69  Pulse: 98  Resp: 16  Temp: 98.3 F (36.8 C)  TempSrc: Oral  SpO2: 98%    General appearance: alert; no distress Neck: supple  Breast: (chaperone present) L nipple with slight lateral thickening; mild TTP; no overlying erythema; no discharge or bleeding Lungs: speaks full sentences without difficulty; unlabored Extremities: no edema Skin: warm and dry Neurologic: normal gait Psychological: alert and cooperative; normal mood and affect   No Known Allergies  Past Medical History:  Diagnosis Date   Asthma    Obesity    PCO (polycystic ovaries)    Social History   Socioeconomic History   Marital status: Single    Spouse name: Buzzy Han   Number of children: 1   Years of education: 12   Highest education  level: High school graduate  Occupational History   Occupation: cna    Comment: [employer]  Tobacco Use   Smoking status: Every Day    Packs/day: 0.25    Years: 8.00    Pack years: 2.00    Types: Cigarettes   Smokeless tobacco: Never   Tobacco comments:    4/5 per day  Vaping Use   Vaping Use: Never used  Substance and Sexual Activity   Alcohol use: Not Currently    Comment: every once in a while   Drug use: No   Sexual activity: Yes    Birth control/protection: None  Other Topics Concern   Not on file  Social History Narrative   ** Merged History Encounter **       Social Determinants of Health   Financial Resource Strain: Low Risk    Difficulty of Paying Living Expenses: Not hard at all  Food Insecurity: Food Insecurity Present   Worried About Charity fundraiser in the Last Year: Never true   Arboriculturist in the Last Year: Sometimes true  Transportation Needs: No Transportation Needs   Lack of Transportation (Medical): No   Lack of Transportation (Non-Medical): No  Physical Activity: Sufficiently Active   Days of Exercise per Week: 6 days   Minutes of Exercise per Session: 30 min  Stress: No Stress Concern Present   Feeling of Stress : Only a  little  Social Connections: Socially Isolated   Frequency of Communication with Friends and Family: More than three times a week   Frequency of Social Gatherings with Friends and Family: More than three times a week   Attends Religious Services: Never   Marine scientist or Organizations: No   Attends Music therapist: Never   Marital Status: Never married  Human resources officer Violence: Not At Risk   Fear of Current or Ex-Partner: No   Emotionally Abused: No   Physically Abused: No   Sexually Abused: No   Family History  Problem Relation Age of Onset   Clotting disorder Mother 63       had "brain" clot?   Hypertension Maternal Grandmother    Diabetes Maternal Grandmother    Thyroid disease Maternal  Grandmother    Hypertension Maternal Grandfather    Diabetes Paternal Grandmother    Past Surgical History:  Procedure Laterality Date   NO PAST SURGERIES       Vanessa Kick, MD 11/21/20 1259    Vanessa Kick, MD 11/21/20 1259

## 2020-11-23 ENCOUNTER — Encounter: Payer: Medicaid Other | Admitting: Women's Health

## 2020-11-25 ENCOUNTER — Ambulatory Visit: Payer: Medicaid Other

## 2020-11-25 ENCOUNTER — Other Ambulatory Visit: Payer: Self-pay

## 2020-11-25 ENCOUNTER — Encounter: Payer: Self-pay | Admitting: Advanced Practice Midwife

## 2020-11-25 ENCOUNTER — Ambulatory Visit (INDEPENDENT_AMBULATORY_CARE_PROVIDER_SITE_OTHER): Payer: Medicaid Other | Admitting: Advanced Practice Midwife

## 2020-11-25 VITALS — BP 135/75 | HR 98 | Wt 276.0 lb

## 2020-11-25 DIAGNOSIS — Z3A24 24 weeks gestation of pregnancy: Secondary | ICD-10-CM | POA: Diagnosis not present

## 2020-11-25 DIAGNOSIS — Z8751 Personal history of pre-term labor: Secondary | ICD-10-CM | POA: Diagnosis not present

## 2020-11-25 MED ORDER — MUPIROCIN 2 % EX OINT: 1.0000 "application " | TOPICAL_OINTMENT | Freq: Two times a day (BID) | CUTANEOUS | 0 refills | Status: DC

## 2020-11-25 NOTE — Patient Instructions (Signed)
Gina Snow, I greatly value your feedback.  If you receive a survey following your visit with [place] today, we appreciate you taking the time to fill it out.  Thanks, Derrill Memo, CNM   You will have your sugar test next visit.  Please do not eat or drink anything after midnight the night before you come, not even water.  You will be here for at least two hours.  Please make an appointment online for the bloodwork at [email or web address] for 8:30am (or as close to this as possible). Make sure you select the [facility] service center. The day of the appointment, check in with our office first, then you will go to [place] to start the sugar test.    Gina Snow!!! It is now [place] at [facility] ([place], [place] [address]) Entrance C, located off of [address] parking  Go to [email or web address] to register for FREE online childbirth classes   Call the office [phone]) or go to [facility] if: You begin to have strong, frequent contractions Your water breaks.  Sometimes it is a big gush of fluid, sometimes it is just a trickle that keeps getting your panties wet or running down your legs You have vaginal bleeding.  It is normal to have a small amount of spotting if your cervix was checked.  You don't feel your baby moving like normal.  If you don't, get you something to eat and drink and lay down and focus on feeling your baby move.   If your baby is still not moving like normal, you should call the office or go to [place] Pediatricians/Family Doctors: [place] [phone]                [place] [phone] (usually not accepting new patients unless you have family there already, you are always welcome to call and ask)      [facility] [phone]       [facility] Pediatricians/Family Doctors:  [facility]: [phone] [facility]: [phone] [facility]: [place] Doctors:  [facility]: [place] [facility]: [place]: [place]: [phone]   Home Blood Pressure Monitoring for Patients   Your provider has recommended that you check your blood pressure (BP) at least once a week at home. If you do not have a blood pressure cuff at home, one will be provided for you. Contact your provider if you have not received your monitor within 1 week.   Helpful Tips for Accurate Home Blood Pressure Checks  Don't smoke, exercise, or drink caffeine 30 minutes before checking your BP Use the restroom before checking your BP (a full bladder can raise your pressure) Relax in a comfortable upright chair Feet on the ground Left arm resting comfortably on a flat surface at the level of your heart Legs uncrossed Back supported Sit quietly and don't talk Place the cuff on your bare arm Adjust snuggly, so that only two fingertips can fit between your skin and the top of the cuff Check 2 readings separated by at least one minute Keep a log of your BP readings For a visual, please reference this diagram: http://ccnc.care/bpdiagram  Provider Name: [facility]     Phone: [phone]  Zone 1: ALL CLEAR  Continue to monitor your symptoms:  BP reading is less than 140 (top number) or less than 90 (bottom number)  No right upper stomach pain No headaches or seeing spots No feeling nauseated or throwing up No swelling in face and hands  Zone 2: CAUTION Call your doctor's office for any of the following:  BP reading is greater than 140 (top number) or greater than 90 (bottom number)  Stomach pain under your ribs in the middle or right side Headaches or seeing spots Feeling nauseated or throwing up Swelling in face and hands  Zone 3: EMERGENCY   Seek immediate medical care if you have any of the following:  BP reading is greater than160 (top number) or greater than 110 (bottom number) Severe headaches not improving with Tylenol Serious difficulty catching your breath Any worsening symptoms from Zone 2   Second Trimester of Pregnancy The second trimester is from week 13 through week 28, months 4 through 6. The second trimester is often a time when you feel your best. Your body has also adjusted to being pregnant, and you begin to feel better physically. Usually, morning sickness has lessened or quit completely, you may have more energy, and you may have an increase in appetite. The second trimester is also a time when the fetus is growing rapidly. At the end of the sixth month, the fetus is about 9 inches long and weighs about 1 pounds. You will likely begin to feel the baby move (quickening) between 18 and 20 weeks of the pregnancy. BODY CHANGES Your body goes through many changes during pregnancy. The changes vary from woman to woman.  Your weight will continue to increase. You will notice your lower abdomen bulging out. You may begin to get stretch marks on your hips, abdomen, and breasts. You may develop headaches that can be relieved by medicines approved by your health care provider. You may urinate more often because the fetus is pressing on your bladder. You may develop or continue to have heartburn as a result of your pregnancy. You may develop constipation because certain hormones are causing the muscles that push waste through your intestines to slow down. You may develop hemorrhoids or swollen, bulging veins (varicose veins). You may have back pain because of the weight gain and pregnancy hormones relaxing your joints between the bones in your pelvis and as a result of a shift in weight and the muscles that support your balance. Your breasts will continue to grow and be tender. Your gums may bleed and may be sensitive to  brushing and flossing. Dark spots or blotches (chloasma, mask of pregnancy) may develop on your face. This will likely fade after the baby is born. A dark line from your belly button to the pubic area (linea nigra) may appear. This will likely fade after the baby is born. You may have changes in your hair. These can include thickening of your hair, rapid growth, and changes in texture. Some women also have hair loss during or after pregnancy, or hair that feels dry or thin. Your hair will most likely return to normal after your baby is born. WHAT TO EXPECT AT YOUR PRENATAL VISITS During a routine prenatal visit: You will be weighed to make sure you and the fetus are growing normally. Your blood pressure will be taken. Your abdomen will be measured to track your baby's growth. The fetal heartbeat will be listened to. Any test results from the previous visit will be discussed. Your health care provider  may ask you: How you are feeling. If you are feeling the baby move. If you have had any abnormal symptoms, such as leaking fluid, bleeding, severe headaches, or abdominal cramping. If you have any questions. Other tests that may be performed during your second trimester include: Blood tests that check for: Low iron levels (anemia). Gestational diabetes (between 24 and 28 weeks). Rh antibodies. Urine tests to check for infections, diabetes, or protein in the urine. An ultrasound to confirm the proper growth and development of the baby. An amniocentesis to check for possible genetic problems. Fetal screens for spina bifida and Down syndrome. HOME CARE INSTRUCTIONS  Avoid all smoking, herbs, alcohol, and unprescribed drugs. These chemicals affect the formation and growth of the baby. Follow your health care provider's instructions regarding medicine use. There are medicines that are either safe or unsafe to take during pregnancy. Exercise only as directed by your health care provider.  Experiencing uterine cramps is a good sign to stop exercising. Continue to eat regular, healthy meals. Wear a good support bra for breast tenderness. Do not use hot tubs, steam rooms, or saunas. Wear your seat belt at all times when driving. Avoid raw meat, uncooked cheese, cat litter boxes, and soil used by cats. These carry germs that can cause birth defects in the baby. Take your prenatal vitamins. Try taking a stool softener (if your health care provider approves) if you develop constipation. Eat more high-fiber foods, such as fresh vegetables or fruit and whole grains. Drink plenty of fluids to keep your urine clear or pale yellow. Take warm sitz baths to soothe any pain or discomfort caused by hemorrhoids. Use hemorrhoid cream if your health care provider approves. If you develop varicose veins, wear support hose. Elevate your feet for 15 minutes, 3-4 times a day. Limit salt in your diet. Avoid heavy lifting, wear low heel shoes, and practice good posture. Rest with your legs elevated if you have leg cramps or low back pain. Visit your dentist if you have not gone yet during your pregnancy. Use a soft toothbrush to brush your teeth and be gentle when you floss. A sexual relationship may be continued unless your health care provider directs you otherwise. Continue to go to all your prenatal visits as directed by your health care provider. SEEK MEDICAL CARE IF:  You have dizziness. You have mild pelvic cramps, pelvic pressure, or nagging pain in the abdominal area. You have persistent nausea, vomiting, or diarrhea. You have a bad smelling vaginal discharge. You have pain with urination. SEEK IMMEDIATE MEDICAL CARE IF:  You have a fever. You are leaking fluid from your vagina. You have spotting or bleeding from your vagina. You have severe abdominal cramping or pain. You have rapid weight gain or loss. You have shortness of breath with chest pain. You notice sudden or extreme swelling  of your face, hands, ankles, feet, or legs. You have not felt your baby move in over an hour. You have severe headaches that do not go away with medicine. You have vision changes. Document Released: 01/25/2001 Document Revised: 02/05/2013 Document Reviewed: 04/03/2012 [facility] Patient Information 2015 [place], [place]. This information is not intended to replace advice given to you by your health care provider. Make sure you discuss any questions you have with your health care provider.

## 2020-11-25 NOTE — Progress Notes (Signed)
   LOW-RISK PREGNANCY VISIT Patient name: Gina Snow MRN [id number]  Date of birth: 1990-08-20 Chief Complaint:   Routine Prenatal Visit  History of Present Illness:   Gina VANOVERBEKE is a 30 y.o. G35P0101 female at [redacted]w[redacted]d with an Estimated Date of Delivery: 03/17/21 being seen today for ongoing management of a low-risk pregnancy.  Today she reports  being seen at Urgent Care approx 1wk ago for L breast 'boil'> was given oral abx which she completed and reports that the boil came to a head and burst with white pus . Contractions: Not present. Vag. Bleeding: None.  Movement: Present. denies leaking of fluid. Review of Systems:   Pertinent items are noted in HPI Denies abnormal vaginal discharge w/ itching/odor/irritation, headaches, visual changes, shortness of breath, chest pain, abdominal pain, severe nausea/vomiting, or problems with urination or bowel movements unless otherwise stated above. Pertinent History Reviewed:  Reviewed past medical,surgical, social, obstetrical and family history.  Reviewed problem list, medications and allergies. Physical Assessment:   Vitals:   11/25/20 0853  BP: 135/75  Pulse: 98  Weight: 276 lb (125.2 kg)  Body mass index is 47.38 kg/m.        Physical Examination:   General appearance: Well appearing, and in no distress  Mental status: Alert, oriented to person, place, and time  Skin: Warm & dry  Cardiovascular: Normal heart rate noted  Breast L: at 3 o'clock is healing subareolar abscess approx 0.5cm; non tender, no further drainage; no erythema  Respiratory: Normal respiratory effort, no distress  Abdomen: Soft, gravid, nontender  Pelvic: Cervical exam deferred         Extremities: Edema: Trace  Fetal Status: Fetal Heart Rate (bpm): 158 Fundal Height: 24 cm Movement: Present    No results found for this or any previous visit (from the past 24 hour(s)).  Assessment & Plan:  1) Low-risk pregnancy G2P0101 at [redacted]w[redacted]d with an Estimated Date of  Delivery: 03/17/21   2) Subareolar abscess on L, completed oral abx; it spontaneously burst; will rx topical abx as it heals  3) Hx PTD at 34wk after PPROM at 33wk, weekly Makena  4) Prediabetes by HgA1c, nl early GTT; will get PN2 with next visit   Meds:  Meds ordered this encounter  Medications   mupirocin ointment (BACTROBAN) 2 %    Sig: Apply 1 application topically 2 (two) times daily.    Dispense:  22 g    Refill:  0    Order Specific Question:   Supervising Provider    Answer:   Florian Buff [[id number]]   Labs/procedures today: Makena inj  Plan:  Continue routine obstetrical care   Reviewed: Preterm labor symptoms and general obstetric precautions including but not limited to vaginal bleeding, contractions, leaking of fluid and fetal movement were reviewed in detail with the patient.  All questions were answered. Has home bp cuff.  Check bp weekly, let us know if >140/90.   Follow-up: Return in about 3 weeks (around 12/16/2020) for LROB, PN2, in person.  No orders of the defined types were placed in this encounter.  Myrtis Ser CNM 11/25/2020 9:27 AM

## 2020-12-01 ENCOUNTER — Telehealth: Payer: Self-pay | Admitting: Women's Health

## 2020-12-01 NOTE — Telephone Encounter (Signed)
Returned pt's call. Two identifiers used. Informed pt that her appt would be changed to a provider visit and confirmed the time. Pt understood.

## 2020-12-01 NOTE — Telephone Encounter (Signed)
Patient called to let us know that the antibiotics she had got put on seemed to be helping the spot on her breast, but it is now back and has a bubble on it. She stated urgent care said it may have to be lanced if it didn't go away. She has a nurse visit tomorrow for her 17P.

## 2020-12-02 ENCOUNTER — Encounter: Payer: Self-pay | Admitting: Obstetrics & Gynecology

## 2020-12-02 ENCOUNTER — Other Ambulatory Visit: Payer: Self-pay

## 2020-12-02 ENCOUNTER — Ambulatory Visit: Payer: Medicaid Other

## 2020-12-02 ENCOUNTER — Ambulatory Visit (INDEPENDENT_AMBULATORY_CARE_PROVIDER_SITE_OTHER): Payer: Medicaid Other | Admitting: Obstetrics & Gynecology

## 2020-12-02 VITALS — BP 133/73 | HR 95 | Wt 273.6 lb

## 2020-12-02 DIAGNOSIS — N6002 Solitary cyst of left breast: Secondary | ICD-10-CM

## 2020-12-02 DIAGNOSIS — Z8751 Personal history of pre-term labor: Secondary | ICD-10-CM

## 2020-12-02 DIAGNOSIS — Z3480 Encounter for supervision of other normal pregnancy, unspecified trimester: Secondary | ICD-10-CM

## 2020-12-02 NOTE — Progress Notes (Signed)
   GYN VISIT Patient name: Gina Snow MRN [id number]  Date of birth: October 31, 1990 Chief Complaint:   Routine Prenatal Visit (Breast problem left)  History of Present Illness:   Gina Snow is a 30 y.o. G44P0101 @[redacted]w[redacted]d  female being seen today for breast concerns.  Seen on 10/5 in ER due to breast infection- treated with Omnicef bid x 14 days.  Initially, pain significantly improved and the swelling went down.  Then seen by Derrill Memo on 10/12 for prenatal visit- given topical Abx, which did not seem to help.  She notes that the area is no longer painful, but rather the swelling is still present. Of note, pt had prior nipple piercing, but was removed.  She denies fever or chills.  No other acute complaints  Patient's last menstrual period was 06/10/2020 (approximate).  Depression screen [facility] 2/9 09/02/2020 10/03/2017 09/17/2012  Decreased Interest 1 0 0  Down, Depressed, Hopeless 2 0 1  PHQ - 2 Score 3 0 1  Altered sleeping 1 1 -  Tired, decreased energy 1 2 -  Change in appetite 0 1 -  Feeling bad or failure about yourself  0 0 -  Trouble concentrating 0 0 -  Moving slowly or fidgety/restless 0 0 -  Suicidal thoughts 0 0 -  PHQ-9 Score 5 4 -    Review of Systems:   Pertinent items are noted in HPI Denies fever/chills, dizziness, headaches, visual disturbances, fatigue, shortness of breath, chest pain, abdominal pain, vomiting, bowel movements, urination, or intercourse unless otherwise stated above.  Pertinent History Reviewed:  Reviewed past medical,surgical, social, obstetrical and family history.  Reviewed problem list, medications and allergies. Physical Assessment:   Vitals:   12/02/20 0927  BP: 133/73  Pulse: 95  Weight: 273 lb 9.6 oz (124.1 kg)  Body mass index is 46.96 kg/m.       Physical Examination:   General appearance: alert, well appearing, and in no distress  Psych: mood appropriate, normal affect  Skin: warm & dry   Breast: Normal right breast, left  breast- no masses, left nipple with 82mm circular swelling- slightly pink- no surrounding erythema, no other abnormalities appreciated  Cardiovascular: normal heart rate noted  Respiratory: normal respiratory effort, no distress   Chaperone:  declined     Assessment & Plan:  1) Nipple abnormalities -no evidence of acute infection/abscess -reviewed conservative treatment and to call if worsening swelling, pain, fever/chills or other acute changes  2) Pregnancy- follow up as scheduled for next OB visit, reviewed lab work/PN-2 at that time   No orders of the defined types were placed in this encounter.   Return for as scheduled.   Janyth Pupa, DO Attending [place], [facility] for [employer], [place]

## 2020-12-09 ENCOUNTER — Ambulatory Visit (INDEPENDENT_AMBULATORY_CARE_PROVIDER_SITE_OTHER): Payer: Medicaid Other | Admitting: Obstetrics & Gynecology

## 2020-12-09 ENCOUNTER — Other Ambulatory Visit: Payer: Self-pay

## 2020-12-09 VITALS — BP 129/72 | HR 102 | Wt 273.0 lb

## 2020-12-09 DIAGNOSIS — O0992 Supervision of high risk pregnancy, unspecified, second trimester: Secondary | ICD-10-CM

## 2020-12-09 DIAGNOSIS — Z3A26 26 weeks gestation of pregnancy: Secondary | ICD-10-CM | POA: Diagnosis not present

## 2020-12-09 DIAGNOSIS — R1013 Epigastric pain: Secondary | ICD-10-CM

## 2020-12-09 DIAGNOSIS — O09213 Supervision of pregnancy with history of pre-term labor, third trimester: Secondary | ICD-10-CM | POA: Diagnosis not present

## 2020-12-09 NOTE — Progress Notes (Signed)
WORK-IN LOW-RISK PREGNANCY VISIT Patient name: Gina Snow MRN [id number]  Date of birth: 12-01-1990 Chief Complaint:   Injections and pelvic pressure (W/I to see dr Nelda Marseille)  History of Present Illness:   SADAE Snow is a 30 y.o. G47P0101 female at [redacted]w[redacted]d with an Estimated Date of Delivery: 03/17/21 being seen today for ongoing management of a low-risk pregnancy.   -h/o PTD- pt seen today for Makena Reported to RN increased pelvic pressure and discomfort.  Denies vaginal bleeding or LOF.  +FM  Additionally, she notes epigastric/mid chest pressure.  Denies difficulty breathing, but just feels like she can't take a deep breath.  Does not report worsening of her symptoms with activity.  States this has been going on for the past 2 days- not getting worse or better.  Does seem to be worse after she eats.   Depression screen [facility] 2/9 09/02/2020 10/03/2017 09/17/2012  Decreased Interest 1 0 0  Down, Depressed, Hopeless 2 0 1  PHQ - 2 Score 3 0 1  Altered sleeping 1 1 -  Tired, decreased energy 1 2 -  Change in appetite 0 1 -  Feeling bad or failure about yourself  0 0 -  Trouble concentrating 0 0 -  Moving slowly or fidgety/restless 0 0 -  Suicidal thoughts 0 0 -  PHQ-9 Score 5 4 -     Review of Systems:   Pertinent items are noted in HPI Denies abnormal vaginal discharge w/ itching/odor/irritation, headaches, visual changes, severe nausea/vomiting, or problems with urination or bowel movements unless otherwise stated above. Pertinent History Reviewed:  Reviewed past medical,surgical, social, obstetrical and family history.  Reviewed problem list, medications and allergies.  Physical Assessment:   Vitals:   12/09/20 0948  BP: 129/72  Pulse: (!) 102  Weight: 273 lb (123.8 kg)  Body mass index is 46.86 kg/m.  O2 sat 97%       Physical Examination:   General appearance: Well appearing, and in no distress  Mental status: Alert, oriented to person, place, and time  Skin:  Warm & dry  Respiratory: Normal respiratory effort, no distress, CTAB  Abdomen: Soft, gravid, nontender  Pelvic: SSE-  cervix closedCervical exam performed  Dilation: Closed Effacement (%): Thick Station: Ballotable  Extremities: Edema: Trace  Psych:  mood and affect appropriate  Fetal Status: Fetal Heart Rate (bpm): 150   Movement: Present    Chaperone:  Angel Neas     No results found for this or any previous visit (from the past 24 hour(s)).   Assessment & Plan:  1) Low-risk pregnancy G2P0101 at [redacted]w[redacted]d with an Estimated Date of Delivery: 03/17/21   2) Pelvic pressure- no evidence of labor, reviewed LRP precautions  3) Epigastric pain- no acute abnormalities noted on exam.  Advised trial of small meals and pepcid.  Should she note any change- ie no improvement with Pepcid or worsening of symptoms, pt advised to go immediately to hospital   Meds: No orders of the defined types were placed in this encounter.  Labs/procedures today: none  Plan:  Continue routine obstetrical care  Next visit: prefers in person    Reviewed: Preterm labor symptoms and general obstetric precautions including but not limited to vaginal bleeding, contractions, leaking of fluid and fetal movement were reviewed in detail with the patient.  All questions were answered. Pt has home bp cuff. Check bp weekly, let us know if >140/90.   Follow-up: Return for as scheduled.  Orders Placed This Encounter  Procedures   Fetal fibronectin    Janyth Pupa, DO Attending [place], [facility] for [facility], [place]

## 2020-12-10 LAB — FETAL FIBRONECTIN: Fetal Fibronectin: NEGATIVE

## 2020-12-16 ENCOUNTER — Ambulatory Visit: Payer: Medicaid Other

## 2020-12-17 ENCOUNTER — Other Ambulatory Visit: Payer: Medicaid Other

## 2020-12-17 ENCOUNTER — Other Ambulatory Visit: Payer: Self-pay

## 2020-12-17 ENCOUNTER — Ambulatory Visit (INDEPENDENT_AMBULATORY_CARE_PROVIDER_SITE_OTHER): Payer: Medicaid Other | Admitting: Advanced Practice Midwife

## 2020-12-17 ENCOUNTER — Telehealth: Payer: Self-pay | Admitting: Advanced Practice Midwife

## 2020-12-17 VITALS — BP 136/78 | HR 99 | Wt 273.0 lb

## 2020-12-17 DIAGNOSIS — Z23 Encounter for immunization: Secondary | ICD-10-CM | POA: Diagnosis not present

## 2020-12-17 DIAGNOSIS — Z3A27 27 weeks gestation of pregnancy: Secondary | ICD-10-CM

## 2020-12-17 DIAGNOSIS — F1290 Cannabis use, unspecified, uncomplicated: Secondary | ICD-10-CM

## 2020-12-17 DIAGNOSIS — Z5941 Food insecurity: Secondary | ICD-10-CM

## 2020-12-17 DIAGNOSIS — Z3480 Encounter for supervision of other normal pregnancy, unspecified trimester: Secondary | ICD-10-CM | POA: Diagnosis not present

## 2020-12-17 NOTE — Telephone Encounter (Signed)
Patient called stating that she had an appointment today and forgot to mention that she has a sinus pressure it might an infection and she would like to know what she can take. Is 27 wk and 1 day. Please contact pt

## 2020-12-17 NOTE — Patient Instructions (Addendum)
Gina Snow, I sent in a referral for the food pantry.  Let me know if you don't hear from someone in a week.  Have a good day!  Gina Snow, I greatly value your feedback.  If you receive a survey following your visit with [place] today, we appreciate you taking the time to fill it out.  Thanks, Nigel Berthold, CNM   [place]!!! It is now Gina Snow at [facility] ([place], [place] [address]) Entrance located off of [address] parking   Go to [email or web address] to register for FREE online childbirth classes    Call the office [phone]) or go to [facility] if: You begin to have strong, frequent contractions Your water breaks.  Sometimes it is a big gush of fluid, sometimes it is just a trickle that keeps getting your panties wet or running down your legs You have vaginal bleeding.  It is normal to have a small amount of spotting if your cervix was checked.  You don't feel your baby moving like normal.  If you don't, get you something to eat and drink and lay down and focus on feeling your baby move.  You should feel at least 10 movements in 2 hours.  If you don't, you should call the office or go to [facility].    Tdap Vaccine It is recommended that you get the Tdap vaccine during the third trimester of EACH pregnancy to help protect your baby from getting pertussis (whooping cough) 27-36 weeks is the BEST time to do this so that you can pass the protection on to your baby. During pregnancy is better than after pregnancy, but if you are unable to get it during pregnancy it will be offered at the hospital.  You will be offered this vaccine in the office after 27 weeks. If you do not have health insurance, you can get this vaccine at the health department or your family doctor Everyone who will be around your baby should also be up-to-date on their vaccines. Adults (who are not pregnant) only need 1 dose of Tdap  during adulthood.   Third Trimester of Pregnancy The third trimester is from week 29 through week 42, months 7 through 9. The third trimester is a time when the fetus is growing rapidly. At the end of the ninth month, the fetus is about 20 inches in length and weighs 6-10 pounds.  BODY CHANGES Your body goes through many changes during pregnancy. The changes vary from woman to woman.  Your weight will continue to increase. You can expect to gain 25-35 pounds (11-16 kg) by the end of the pregnancy. You may begin to get stretch marks on your hips, abdomen, and breasts. You may urinate more often because the fetus is moving lower into your pelvis and pressing on your bladder. You may develop or continue to have heartburn as a result of your pregnancy. You may develop constipation because certain hormones are causing the muscles that push waste through your intestines to slow down. You may develop hemorrhoids or swollen, bulging veins (varicose veins). You may have pelvic pain because of the weight gain and pregnancy hormones relaxing your joints between the bones in your pelvis. Backaches may result from overexertion of the muscles supporting your posture. You may have changes in your hair. These can include thickening of your hair, rapid growth, and changes in texture. Some women also have hair loss  during or after pregnancy, or hair that feels dry or thin. Your hair will most likely return to normal after your baby is born. Your breasts will continue to grow and be tender. A yellow discharge may leak from your breasts called colostrum. Your belly button may stick out. You may feel short of breath because of your expanding uterus. You may notice the fetus "dropping," or moving lower in your abdomen. You may have a bloody mucus discharge. This usually occurs a few days to a week before labor begins. Your cervix becomes thin and soft (effaced) near your due date. WHAT TO EXPECT AT YOUR PRENATAL EXAMS   You will have prenatal exams every 2 weeks until week 36. Then, you will have weekly prenatal exams. During a routine prenatal visit: You will be weighed to make sure you and the fetus are growing normally. Your blood pressure is taken. Your abdomen will be measured to track your baby's growth. The fetal heartbeat will be listened to. Any test results from the previous visit will be discussed. You may have a cervical check near your due date to see if you have effaced. At around 36 weeks, your caregiver will check your cervix. At the same time, your caregiver will also perform a test on the secretions of the vaginal tissue. This test is to determine if a type of bacteria, Group B streptococcus, is present. Your caregiver will explain this further. Your caregiver may ask you: What your birth plan is. How you are feeling. If you are feeling the baby move. If you have had any abnormal symptoms, such as leaking fluid, bleeding, severe headaches, or abdominal cramping. If you have any questions. Other tests or screenings that may be performed during your third trimester include: Blood tests that check for low iron levels (anemia). Fetal testing to check the health, activity level, and growth of the fetus. Testing is done if you have certain medical conditions or if there are problems during the pregnancy. FALSE LABOR You may feel small, irregular contractions that eventually go away. These are called Braxton Hicks contractions, or false labor. Contractions may last for hours, days, or even weeks before true labor sets in. If contractions come at regular intervals, intensify, or become painful, it is best to be seen by your caregiver.  SIGNS OF LABOR  Menstrual-like cramps. Contractions that are 5 minutes apart or less. Contractions that start on the top of the uterus and spread down to the lower abdomen and back. A sense of increased pelvic pressure or back pain. A watery or bloody mucus  discharge that comes from the vagina. If you have any of these signs before the 37th week of pregnancy, call your caregiver right away. You need to go to the hospital to get checked immediately. HOME CARE INSTRUCTIONS  Avoid all smoking, herbs, alcohol, and unprescribed drugs. These chemicals affect the formation and growth of the baby. Follow your caregiver's instructions regarding medicine use. There are medicines that are either safe or unsafe to take during pregnancy. Exercise only as directed by your caregiver. Experiencing uterine cramps is a good sign to stop exercising. Continue to eat regular, healthy meals. Wear a good support bra for breast tenderness. Do not use hot tubs, steam rooms, or saunas. Wear your seat belt at all times when driving. Avoid raw meat, uncooked cheese, cat litter boxes, and soil used by cats. These carry germs that can cause birth defects in the baby. Take your prenatal vitamins. Try taking a stool  softener (if your caregiver approves) if you develop constipation. Eat more high-fiber foods, such as fresh vegetables or fruit and whole grains. Drink plenty of fluids to keep your urine clear or pale yellow. Take warm sitz baths to soothe any pain or discomfort caused by hemorrhoids. Use hemorrhoid cream if your caregiver approves. If you develop varicose veins, wear support hose. Elevate your feet for 15 minutes, 3-4 times a day. Limit salt in your diet. Avoid heavy lifting, wear low heal shoes, and practice good posture. Rest a lot with your legs elevated if you have leg cramps or low back pain. Visit your dentist if you have not gone during your pregnancy. Use a soft toothbrush to brush your teeth and be gentle when you floss. A sexual relationship may be continued unless your caregiver directs you otherwise. Do not travel far distances unless it is absolutely necessary and only with the approval of your caregiver. Take prenatal classes to understand, practice,  and ask questions about the labor and delivery. Make a trial run to the hospital. Pack your hospital bag. Prepare the baby's nursery. Continue to go to all your prenatal visits as directed by your caregiver. SEEK MEDICAL CARE IF: You are unsure if you are in labor or if your water has broken. You have dizziness. You have mild pelvic cramps, pelvic pressure, or nagging pain in your abdominal area. You have persistent nausea, vomiting, or diarrhea. You have a bad smelling vaginal discharge. You have pain with urination. SEEK IMMEDIATE MEDICAL CARE IF:  You have a fever. You are leaking fluid from your vagina. You have spotting or bleeding from your vagina. You have severe abdominal cramping or pain. You have rapid weight loss or gain. You have shortness of breath with chest pain. You notice sudden or extreme swelling of your face, hands, ankles, feet, or legs. You have not felt your baby move in over an hour. You have severe headaches that do not go away with medicine. You have vision changes. Document Released: 01/25/2001 Document Revised: 02/05/2013 Document Reviewed: 04/03/2012 [facility] Patient Information 2015 [place], [place]. This information is not intended to replace advice given to you by your health care provider. Make sure you discuss any questions you have with your health care provider.

## 2020-12-17 NOTE — Progress Notes (Addendum)
   LOW-RISK PREGNANCY VISIT Patient name: Gina Snow MRN [id number]  Date of birth: 22-Jul-1990 Chief Complaint:   Routine Prenatal Visit (PN2, 17P)  History of Present Illness:   Gina Snow is a 30 y.o. G1P0101 female at [redacted]w[redacted]d with an Estimated Date of Delivery: 03/17/21 being seen today for ongoing management of a low-risk pregnancy.  Today she reports no complaints. Contractions: Not present. Vag. Bleeding: None.  Movement: Present. denies leaking of fluid. Has lost food stamp benefits d/t increase in threshold.  Offered referral to [employer], pt accepted. Elevated Pq9 score.  Offered therapy referral and pt declined.  States she thinks it's just being pregnant, hasn't had hx of depression.  Encouraged to let me know if her mental health declines.  Review of Systems:   Pertinent items are noted in HPI Denies abnormal vaginal discharge w/ itching/odor/irritation, headaches, visual changes, shortness of breath, chest pain, abdominal pain, severe nausea/vomiting, or problems with urination or bowel movements unless otherwise stated above. Pertinent History Reviewed:  Reviewed past medical,surgical, social, obstetrical and family history.  Reviewed problem list, medications and allergies. Physical Assessment:   Vitals:   12/17/20 0906  BP: 136/78  Pulse: 99  Weight: 273 lb (123.8 kg)  Body mass index is 46.86 kg/m.        Physical Examination:   General appearance: Well appearing, and in no distress  Mental status: Alert, oriented to person, place, and time  Skin: Warm & dry  Cardiovascular: Normal heart rate noted  Respiratory: Normal respiratory effort, no distress  Abdomen: Soft, gravid, nontender  Pelvic: Cervical exam deferred         Extremities: Edema: Trace  Fetal Status: Fetal Heart Rate (bpm): 148 Fundal Height: 27 cm Movement: Present    Chaperone: n/a    No results found for this or any previous visit (from the past 24 hour(s)).  Assessment & Plan:  1)  Low-risk pregnancy G2P0101 at [redacted]w[redacted]d with an Estimated Date of Delivery: 03/17/21   2)  Hx PTD, on Makena  3)  Food insecurity_ referral sent to [employer]   Meds: No orders of the defined types were placed in this encounter.  Labs/procedures today: PN2, Tdap, Makena  Plan:  Continue routine obstetrical care  Next visit: prefers in person    Reviewed: Preterm labor symptoms and general obstetric precautions including but not limited to vaginal bleeding, contractions, leaking of fluid and fetal movement were reviewed in detail with the patient.  All questions were answered. Has home bp cuff. Check bp weekly, let us know if >140/90.   Follow-up: Return in about 3 weeks (around 01/07/2021) for [place].  Orders Placed This Encounter  Procedures   Tdap vaccine greater than or equal to 7yo IM   AMBULATORY REFERRAL TO [place] Cresenzo-Dishmon DNP, CNM 12/17/2020 1:58 PM

## 2020-12-18 LAB — CBC
Hematocrit: 34.7 % (ref 34.0–46.6)
Hemoglobin: 12.1 g/dL (ref 11.1–15.9)
MCH: 32.4 pg (ref 26.6–33.0)
MCHC: 34.9 g/dL (ref 31.5–35.7)
MCV: 93 fL (ref 79–97)
Platelets: 228 10*3/uL (ref 150–450)
RBC: 3.74 x10E6/uL — ABNORMAL LOW (ref 3.77–5.28)
RDW: 13.2 % (ref 11.7–15.4)
WBC: 16 10*3/uL — ABNORMAL HIGH (ref 3.4–10.8)

## 2020-12-18 LAB — HIV ANTIBODY (ROUTINE TESTING W REFLEX): HIV Screen 4th Generation wRfx: NONREACTIVE

## 2020-12-18 LAB — GLUCOSE TOLERANCE, 2 HOURS W/ 1HR
Glucose, 1 hour: 122 mg/dL (ref 70–179)
Glucose, 2 hour: 116 mg/dL (ref 70–152)
Glucose, Fasting: 84 mg/dL (ref 70–91)

## 2020-12-18 LAB — RPR: RPR Ser Ql: NONREACTIVE

## 2020-12-18 LAB — ANTIBODY SCREEN: Antibody Screen: NEGATIVE

## 2020-12-23 ENCOUNTER — Ambulatory Visit: Payer: Medicaid Other

## 2020-12-24 ENCOUNTER — Other Ambulatory Visit: Payer: Self-pay

## 2020-12-24 ENCOUNTER — Other Ambulatory Visit (INDEPENDENT_AMBULATORY_CARE_PROVIDER_SITE_OTHER): Payer: Medicaid Other

## 2020-12-24 VITALS — BP 124/73 | HR 101 | Wt 276.0 lb

## 2020-12-24 DIAGNOSIS — Z8751 Personal history of pre-term labor: Secondary | ICD-10-CM

## 2020-12-24 NOTE — Progress Notes (Signed)
   NURSE VISIT- INJECTION  SUBJECTIVE:  Gina Snow is a 30 y.o. G23P0101 female here for a Makena for history of preterm birth. She is [redacted]w[redacted]d pregnant.   OBJECTIVE:  BP 124/73   Pulse (!) 101   Wt 276 lb (125.2 kg)   LMP 06/10/2020 (Approximate)   BMI 47.38 kg/m   Appears well, in no apparent distress  Injection administered in: Left arm  No orders of the defined types were placed in this encounter.   ASSESSMENT: Pregnancy [redacted]w[redacted]d Makena for history of preterm birth PLAN: Follow-up: in 1 week for next Makena   Arielis Leonhart A Taraneh Metheney  12/24/2020 12:23 PM

## 2020-12-30 ENCOUNTER — Ambulatory Visit (INDEPENDENT_AMBULATORY_CARE_PROVIDER_SITE_OTHER): Payer: Medicaid Other | Admitting: *Deleted

## 2020-12-30 ENCOUNTER — Other Ambulatory Visit: Payer: Self-pay

## 2020-12-30 VITALS — BP 135/77 | HR 103 | Wt 276.0 lb

## 2020-12-30 DIAGNOSIS — Z8751 Personal history of pre-term labor: Secondary | ICD-10-CM

## 2020-12-30 DIAGNOSIS — Z3480 Encounter for supervision of other normal pregnancy, unspecified trimester: Secondary | ICD-10-CM

## 2020-12-30 DIAGNOSIS — F1290 Cannabis use, unspecified, uncomplicated: Secondary | ICD-10-CM

## 2020-12-30 NOTE — Progress Notes (Signed)
   NURSE VISIT- INJECTION  SUBJECTIVE:  Gina Snow is a 30 y.o. G51P0101 female here for a Makena for history of preterm birth. She is [redacted]w[redacted]d pregnant.   OBJECTIVE:  BP 135/77   Pulse (!) 103   Wt 276 lb (125.2 kg)   LMP 06/10/2020 (Approximate)   BMI 47.38 kg/m   Appears well, in no apparent distress  Injection administered in: Right arm  No orders of the defined types were placed in this encounter.   ASSESSMENT: Pregnancy [redacted]w[redacted]d Makena for history of preterm birth PLAN: Follow-up: in 1 week for next Clementeen Hoof  12/30/2020 11:37 AM

## 2021-01-06 ENCOUNTER — Other Ambulatory Visit: Payer: Self-pay

## 2021-01-06 ENCOUNTER — Encounter: Payer: Self-pay | Admitting: *Deleted

## 2021-01-06 ENCOUNTER — Ambulatory Visit (INDEPENDENT_AMBULATORY_CARE_PROVIDER_SITE_OTHER): Payer: Medicaid Other | Admitting: *Deleted

## 2021-01-06 VITALS — BP 139/75 | HR 101 | Wt 283.0 lb

## 2021-01-06 DIAGNOSIS — F1290 Cannabis use, unspecified, uncomplicated: Secondary | ICD-10-CM

## 2021-01-06 DIAGNOSIS — Z3A30 30 weeks gestation of pregnancy: Secondary | ICD-10-CM | POA: Diagnosis not present

## 2021-01-06 DIAGNOSIS — Z3480 Encounter for supervision of other normal pregnancy, unspecified trimester: Secondary | ICD-10-CM

## 2021-01-06 NOTE — Progress Notes (Signed)
   NURSE VISIT- INJECTION  SUBJECTIVE:  Gina Snow is a 30 y.o. G38P0101 female here for a Makena for history of preterm birth. She is [redacted]w[redacted]d pregnant.   OBJECTIVE:  BP 139/75   Pulse (!) 101   Wt 283 lb (128.4 kg)   LMP 06/10/2020 (Approximate)   BMI 48.58 kg/m   Appears well, in no apparent distress  Injection administered in: Left arm  No orders of the defined types were placed in this encounter.   ASSESSMENT: Pregnancy [redacted]w[redacted]d Makena for history of preterm birth PLAN: Follow-up: in 1 week for next Alric Ran  01/06/2021 9:50 AM

## 2021-01-11 ENCOUNTER — Encounter: Payer: Medicaid Other | Admitting: Women's Health

## 2021-01-13 ENCOUNTER — Emergency Department (HOSPITAL_COMMUNITY)
Admission: EM | Admit: 2021-01-13 | Discharge: 2021-01-13 | Disposition: A | Discharge status: Home / Self Care | Payer: Medicaid Other | Attending: Emergency Medicine | Admitting: Emergency Medicine

## 2021-01-13 ENCOUNTER — Ambulatory Visit: Payer: Medicaid Other

## 2021-01-13 ENCOUNTER — Other Ambulatory Visit: Payer: Self-pay

## 2021-01-13 ENCOUNTER — Encounter (HOSPITAL_COMMUNITY): Payer: Self-pay | Admitting: *Deleted

## 2021-01-13 DIAGNOSIS — Z7982 Long term (current) use of aspirin: Secondary | ICD-10-CM | POA: Diagnosis not present

## 2021-01-13 DIAGNOSIS — J101 Influenza due to other identified influenza virus with other respiratory manifestations: Secondary | ICD-10-CM | POA: Insufficient documentation

## 2021-01-13 DIAGNOSIS — R000 Tachycardia, unspecified: Secondary | ICD-10-CM | POA: Diagnosis not present

## 2021-01-13 DIAGNOSIS — J45909 Unspecified asthma, uncomplicated: Secondary | ICD-10-CM | POA: Diagnosis not present

## 2021-01-13 DIAGNOSIS — Z20822 Contact with and (suspected) exposure to covid-19: Secondary | ICD-10-CM | POA: Insufficient documentation

## 2021-01-13 DIAGNOSIS — J3489 Other specified disorders of nose and nasal sinuses: Secondary | ICD-10-CM | POA: Diagnosis not present

## 2021-01-13 DIAGNOSIS — F17210 Nicotine dependence, cigarettes, uncomplicated: Secondary | ICD-10-CM | POA: Diagnosis not present

## 2021-01-13 DIAGNOSIS — R059 Cough, unspecified: Secondary | ICD-10-CM | POA: Diagnosis present

## 2021-01-13 LAB — RESP PANEL BY RT-PCR (FLU A&B, COVID) ARPGX2
Influenza A by PCR: POSITIVE — AB
Influenza B by PCR: NEGATIVE
SARS Coronavirus 2 by RT PCR: NEGATIVE

## 2021-01-13 NOTE — ED Provider Notes (Signed)
[facility] Provider Note   CSN: [id number] Arrival date & time: 01/13/21  1930     History Chief Complaint  Patient presents with   Generalized Body Aches    Gina Snow is a 30 y.o. female.  Patient reports generalized body aches, along with headache, sneezing, cough. Has been taking tylenol and zyrtec.   Influenza Presenting symptoms: cough, fatigue, headache, myalgias, rhinorrhea and sore throat   Cough:    Cough characteristics:  Productive   Severity:  Moderate   Onset quality:  Gradual   Duration:  1 week   Timing:  Intermittent   Progression:  Waxing and waning   Chronicity:  New Severity:  Moderate Onset quality:  Gradual Duration:  1 week Progression:  Partially resolved Chronicity:  New Associated symptoms: nasal congestion       Past Medical History:  Diagnosis Date   Asthma    Obesity    PCO (polycystic ovaries)     Patient Active Problem List   Diagnosis Date Noted   Marijuana use 09/09/2020   Encounter for supervision of normal pregnancy, antepartum 09/02/2020   History of preterm delivery 04/16/2018   History of abnormal cervical Pap smear 01/03/2018   PCOS (polycystic ovarian syndrome) 12/21/2012   Prediabetes 10/26/2012   Tobacco use 09/17/2012    Past Surgical History:  Procedure Laterality Date   NO PAST SURGERIES       OB History     Gravida  2   Para  1   Term  0   Preterm  1   AB  0   Living  1      SAB  0   IAB  0   Ectopic  0   Multiple      Live Births  1           Family History  Problem Relation Age of Onset   Clotting disorder Mother 45       had "brain" clot?   Hypertension Maternal Grandmother    Diabetes Maternal Grandmother    Thyroid disease Maternal Grandmother    Hypertension Maternal Grandfather    Diabetes Paternal Grandmother     Social History   Tobacco Use   Smoking status: Every Day    Packs/day: 0.25    Years: 8.00    Pack years: 2.00     Types: Cigarettes   Smokeless tobacco: Never   Tobacco comments:    4/5 per day  Vaping Use   Vaping Use: Never used  Substance Use Topics   Alcohol use: Not Currently    Comment: every once in a while   Drug use: No    Home Medications Prior to Admission medications   Medication Sig Start Date End Date Taking? Authorizing Provider  acetaminophen (TYLENOL) 500 MG tablet Take 500 mg by mouth every 6 (six) hours as needed for mild pain.    [provider]  albuterol (PROVENTIL HFA;VENTOLIN HFA) 108 (90 Base) MCG/ACT inhaler Inhale 1-2 puffs into the lungs every 6 (six) hours as needed for wheezing or shortness of breath. 02/12/18   Tacy Learn, PA-C  aspirin 81 MG chewable tablet Chew 2 tablets (162 mg total) by mouth daily. 09/02/20   Myrtis Ser, CNM  Prenat w/o [id number] [facility] ASSURE) 35-1 & 300 MG tablet One tablet and one capsule daily 07/28/20   Roma Schanz, CNM    Allergies    Patient has no known allergies.  Review  of Systems   Review of Systems  Constitutional:  Positive for fatigue.  HENT:  Positive for congestion, rhinorrhea and sore throat.   Respiratory:  Positive for cough.   Musculoskeletal:  Positive for myalgias.  Neurological:  Positive for headaches.  All other systems reviewed and are negative.  Physical Exam Updated Vital Signs BP (!) 148/83 (BP Location: Right Arm)   Pulse (!) 108   Temp 98 F (36.7 C) (Oral)   Resp 19   Ht 5\' 4"  (1.626 m)   Wt 125.2 kg   LMP 06/10/2020 (Approximate)   SpO2 100%   BMI 47.38 kg/m   Physical Exam Constitutional:      General: She is not in acute distress. HENT:     Right Ear: Tympanic membrane normal.     Left Ear: Tympanic membrane normal.     Nose: Congestion present.     Mouth/Throat:     Pharynx: Posterior oropharyngeal erythema present. No oropharyngeal exudate.  Eyes:     Extraocular Movements: Extraocular movements intact.     Conjunctiva/sclera: Conjunctivae  normal.  Cardiovascular:     Rate and Rhythm: Tachycardia present.  Pulmonary:     Effort: Pulmonary effort is normal.     Breath sounds: Normal breath sounds.  Abdominal:     Palpations: Abdomen is soft.  Musculoskeletal:        General: Normal range of motion.     Cervical back: Normal range of motion.  Lymphadenopathy:     Cervical: No cervical adenopathy.  Skin:    General: Skin is warm and dry.     Findings: No rash.  Neurological:     Mental Status: She is alert and oriented to person, place, and time.  Psychiatric:        Mood and Affect: Mood normal.        Behavior: Behavior normal.    ED Results / Procedures / Treatments   Labs (all labs ordered are listed, but only abnormal results are displayed) Labs Reviewed  RESP PANEL BY RT-PCR (FLU A&B, COVID) [id number]    EKG None  Radiology No results found.  Procedures Procedures   Medications Ordered in ED Medications - No data to display  ED Course  I have reviewed the triage vital signs and the nursing notes.  Pertinent labs & imaging results that were available during my care of the patient were reviewed by me and considered in my medical decision making (see chart for details).    MDM Rules/Calculators/A&P                           Patient with symptoms consistent with influenza.  Vitals are stable, low-grade fever.  No signs of dehydration, tolerating PO's.  Lungs are clear.   Patient will be discharged with instructions to orally hydrate, rest, and use over-the-counter medications such as Tylenol for fever.   Final Clinical Impression(s) / ED Diagnoses Final diagnoses:  Influenza A    Rx / DC Orders ED Discharge Orders     None        Etta Quill, NP 01/13/21 2317    Hayden Rasmussen, MD 01/14/21 1105

## 2021-01-13 NOTE — ED Triage Notes (Signed)
Pt c/o generalized body aches x one week with headache, cough, sneezing; pt has been taking zyrtec and tylenol with no relief; pt is [redacted] weeks pregnant

## 2021-01-13 NOTE — ED Notes (Signed)
Dc instructions reviewed with pt no questions or concerns at this time. Will follow up with obgyn.

## 2021-01-13 NOTE — Discharge Instructions (Addendum)
Tylenol is safe to use for fever and body aches. Do not use ibuprofen. Please follow-up with your OB provider.

## 2021-01-14 ENCOUNTER — Ambulatory Visit: Payer: Medicaid Other

## 2021-01-20 ENCOUNTER — Ambulatory Visit (INDEPENDENT_AMBULATORY_CARE_PROVIDER_SITE_OTHER): Payer: Medicaid Other | Admitting: Advanced Practice Midwife

## 2021-01-20 ENCOUNTER — Ambulatory Visit: Payer: Medicaid Other

## 2021-01-20 ENCOUNTER — Other Ambulatory Visit: Payer: Self-pay

## 2021-01-20 VITALS — BP 129/71 | HR 110 | Wt 280.0 lb

## 2021-01-20 DIAGNOSIS — O0990 Supervision of high risk pregnancy, unspecified, unspecified trimester: Secondary | ICD-10-CM

## 2021-01-20 DIAGNOSIS — Z3A32 32 weeks gestation of pregnancy: Secondary | ICD-10-CM | POA: Diagnosis not present

## 2021-01-20 MED ORDER — HYDROXYPROGESTERONE CAPROATE 275 MG/1.1ML ~~LOC~~ SOAJ
275.0000 mg | Freq: Once | SUBCUTANEOUS | Status: AC
Start: 2021-01-20 — End: 2021-01-20
  Administered 2021-01-20: 275 mg via SUBCUTANEOUS

## 2021-01-20 NOTE — Progress Notes (Signed)
   LOW-RISK PREGNANCY VISIT Patient name: Gina Snow MRN [id number]  Date of birth: 16-Apr-1990 Chief Complaint:   Routine Prenatal Visit and Injections  History of Present Illness:   Gina Snow is a 30 y.o. G34P0101 female at [redacted]w[redacted]d with an Estimated Date of Delivery: 03/17/21 being seen today for ongoing management of a low-risk pregnancy.  Today she reports no complaints. Contractions: Irritability. Vag. Bleeding: None.  Movement: Present. denies leaking of fluid. Review of Systems:   Pertinent items are noted in HPI Denies abnormal vaginal discharge w/ itching/odor/irritation, headaches, visual changes, shortness of breath, chest pain, abdominal pain, severe nausea/vomiting, or problems with urination or bowel movements unless otherwise stated above. Pertinent History Reviewed:  Reviewed past medical,surgical, social, obstetrical and family history.  Reviewed problem list, medications and allergies. Physical Assessment:   Vitals:   01/20/21 1332  BP: 129/71  Pulse: (!) 110  Weight: 280 lb (127 kg)  Body mass index is 48.06 kg/m.        Physical Examination:   General appearance: Well appearing, and in no distress  Mental status: Alert, oriented to person, place, and time  Skin: Warm & dry  Cardiovascular: Normal heart rate noted  Respiratory: Normal respiratory effort, no distress  Abdomen: Soft, gravid, nontender  Pelvic: Cervical exam deferred         Extremities: Edema: Trace  Fetal Status: Fetal Heart Rate (bpm): 148 Fundal Height: 34 cm Movement: Present    No results found for this or any previous visit (from the past 24 hour(s)).  Assessment & Plan:  1) Low-risk pregnancy G2P0101 at [redacted]w[redacted]d with an Estimated Date of Delivery: 03/17/21   2) Hx PTD, weekly Makena until 36wks   Meds:  Meds ordered this encounter  Medications   HYDROXYprogesterone caproate (Makena) autoinjector 275 mg   Labs/procedures today: Makena inj  Plan:  Continue routine obstetrical  care   Reviewed: Preterm labor symptoms and general obstetric precautions including but not limited to vaginal bleeding, contractions, leaking of fluid and fetal movement were reviewed in detail with the patient.  All questions were answered. Has home bp cuff.  Check bp weekly, let us know if >140/90.   Follow-up: Return in about 2 weeks (around 02/03/2021) for [place], in person.  No orders of the defined types were placed in this encounter.  Myrtis Ser CNM 01/20/2021 1:54 PM

## 2021-01-20 NOTE — Patient Instructions (Signed)
Gina Snow, thank you for choosing our office today! We appreciate the opportunity to meet your healthcare needs. You may receive a short survey by mail, e-mail, or through [employer]. If you are happy with your care we would appreciate if you could take just a few minutes to complete the survey questions. We read all of your comments and take your feedback very seriously. Thank you again for choosing our office.  Center for [employer] Team at [place] at [facility] ([place], [place] [address]) Entrance C, located off of [address] parking   CLASSES: Go to [email or web address] to register for classes (childbirth, breastfeeding, waterbirth, infant CPR, daddy bootcamp, etc.)  Call the office [phone]) or go to [facility] if: You begin to have strong, frequent contractions Your water breaks.  Sometimes it is a big gush of fluid, sometimes it is just a trickle that keeps getting your panties wet or running down your legs You have vaginal bleeding.  It is normal to have a small amount of spotting if your cervix was checked.  You don't feel your baby moving like normal.  If you don't, get you something to eat and drink and lay down and focus on feeling your baby move.   If your baby is still not moving like normal, you should call the office or go to [facility].  Call the office [phone]) or go to [facility] hospital for these signs of pre-eclampsia: Severe headache that does not go away with Tylenol Visual changes- seeing spots, double, blurred vision Pain under your right breast or upper abdomen that does not go away with Tums or heartburn medicine Nausea and/or vomiting Severe swelling in your hands, feet, and face   Tdap Vaccine It is recommended that you get the Tdap vaccine during the third trimester of EACH pregnancy to help protect your baby from getting pertussis (whooping cough) 27-36 weeks is the BEST time to do  this so that you can pass the protection on to your baby. During pregnancy is better than after pregnancy, but if you are unable to get it during pregnancy it will be offered at the hospital.  You can get this vaccine with [place], at the health department, your family doctor, or some local pharmacies Everyone who will be around your baby should also be up-to-date on their vaccines before the baby comes. Adults (who are not pregnant) only need 1 dose of Tdap during adulthood.   [facility] Pediatricians/Family Doctors [facility]): [address] [place], [facility]: [address] [place], [phone]                [facility]): [place], [phone] (call to ask if accepting patients) [facility] Department: [address], [place], [place] Pediatricians/Family Doctors [facility]): [place] [place], [address], [facility]: [address] [place], [place] [facility]: [place], [facility]): [phone] [facility]: [address] [place]: [address] [facility]: [phone], [phone]  Home Blood Pressure Monitoring for Patients   Your provider has recommended that you check your  blood pressure (BP) at least once a week at home. If you do not have a blood pressure cuff at home, one will be provided for you. Contact your provider if you have not received your monitor within 1 week.   Helpful Tips for Accurate Home Blood Pressure Checks  Don't smoke, exercise, or drink caffeine 30 minutes before checking your BP Use the restroom before checking your BP (a full bladder can raise your  pressure) Relax in a comfortable upright chair Feet on the ground Left arm resting comfortably on a flat surface at the level of your heart Legs uncrossed Back supported Sit quietly and don't talk Place the cuff on your bare arm Adjust snuggly, so that only two fingertips can fit between your skin and the top of the cuff Check 2 readings separated by at least one minute Keep a log of your BP readings For a visual, please reference this diagram: http://ccnc.care/bpdiagram  Provider Name: [facility]     Phone: [phone]  Zone 1: ALL CLEAR  Continue to monitor your symptoms:  BP reading is less than 140 (top number) or less than 90 (bottom number)  No right upper stomach pain No headaches or seeing spots No feeling nauseated or throwing up No swelling in face and hands  Zone 2: CAUTION Call your doctor's office for any of the following:  BP reading is greater than 140 (top number) or greater than 90 (bottom number)  Stomach pain under your ribs in the middle or right side Headaches or seeing spots Feeling nauseated or throwing up Swelling in face and hands  Zone 3: EMERGENCY  Seek immediate medical care if you have any of the following:  BP reading is greater than160 (top number) or greater than 110 (bottom number) Severe headaches not improving with Tylenol Serious difficulty catching your breath Any worsening symptoms from Zone 2   Third Trimester of Pregnancy The third trimester is from week 29 through week 42, months 7 through 9. The third trimester is a time when the fetus is growing rapidly. At the end of the ninth month, the fetus is about 20 inches in length and weighs 6-10 pounds.  BODY CHANGES Your body goes through many changes during pregnancy. The changes vary from woman to woman.  Your weight will continue to increase. You can expect to gain 25-35 pounds (11-16 kg) by the end of the pregnancy. You may begin to get stretch marks on your hips, abdomen,  and breasts. You may urinate more often because the fetus is moving lower into your pelvis and pressing on your bladder. You may develop or continue to have heartburn as a result of your pregnancy. You may develop constipation because certain hormones are causing the muscles that push waste through your intestines to slow down. You may develop hemorrhoids or swollen, bulging veins (varicose veins). You may have pelvic pain because of the weight gain and pregnancy hormones relaxing your joints between the bones in your pelvis. Backaches may result from overexertion of the muscles supporting your posture. You may have changes in your hair. These can include thickening of your hair, rapid growth, and changes in texture. Some women also have hair loss during or after pregnancy, or hair that feels dry or thin. Your hair will most likely return to normal after your baby is born. Your breasts will continue to grow and be tender. A yellow discharge may leak from your breasts called colostrum. Your belly button may stick out. You may   feel short of breath because of your expanding uterus. You may notice the fetus "dropping," or moving lower in your abdomen. You may have a bloody mucus discharge. This usually occurs a few days to a week before labor begins. Your cervix becomes thin and soft (effaced) near your due date. WHAT TO EXPECT AT YOUR PRENATAL EXAMS  You will have prenatal exams every 2 weeks until week 36. Then, you will have weekly prenatal exams. During a routine prenatal visit: You will be weighed to make sure you and the fetus are growing normally. Your blood pressure is taken. Your abdomen will be measured to track your baby's growth. The fetal heartbeat will be listened to. Any test results from the previous visit will be discussed. You may have a cervical check near your due date to see if you have effaced. At around 36 weeks, your caregiver will check your cervix. At the same time, your  caregiver will also perform a test on the secretions of the vaginal tissue. This test is to determine if a type of bacteria, Group B streptococcus, is present. Your caregiver will explain this further. Your caregiver may ask you: What your birth plan is. How you are feeling. If you are feeling the baby move. If you have had any abnormal symptoms, such as leaking fluid, bleeding, severe headaches, or abdominal cramping. If you have any questions. Other tests or screenings that may be performed during your third trimester include: Blood tests that check for low iron levels (anemia). Fetal testing to check the health, activity level, and growth of the fetus. Testing is done if you have certain medical conditions or if there are problems during the pregnancy. FALSE LABOR You may feel small, irregular contractions that eventually go away. These are called Braxton Hicks contractions, or false labor. Contractions may last for hours, days, or even weeks before true labor sets in. If contractions come at regular intervals, intensify, or become painful, it is best to be seen by your caregiver.  SIGNS OF LABOR  Menstrual-like cramps. Contractions that are 5 minutes apart or less. Contractions that start on the top of the uterus and spread down to the lower abdomen and back. A sense of increased pelvic pressure or back pain. A watery or bloody mucus discharge that comes from the vagina. If you have any of these signs before the 37th week of pregnancy, call your caregiver right away. You need to go to the hospital to get checked immediately. HOME CARE INSTRUCTIONS  Avoid all smoking, herbs, alcohol, and unprescribed drugs. These chemicals affect the formation and growth of the baby. Follow your caregiver's instructions regarding medicine use. There are medicines that are either safe or unsafe to take during pregnancy. Exercise only as directed by your caregiver. Experiencing uterine cramps is a good sign to  stop exercising. Continue to eat regular, healthy meals. Wear a good support bra for breast tenderness. Do not use hot tubs, steam rooms, or saunas. Wear your seat belt at all times when driving. Avoid raw meat, uncooked cheese, cat litter boxes, and soil used by cats. These carry germs that can cause birth defects in the baby. Take your prenatal vitamins. Try taking a stool softener (if your caregiver approves) if you develop constipation. Eat more high-fiber foods, such as fresh vegetables or fruit and whole grains. Drink plenty of fluids to keep your urine clear or pale yellow. Take warm sitz baths to soothe any pain or discomfort caused by hemorrhoids. Use hemorrhoid cream if   your caregiver approves. If you develop varicose veins, wear support hose. Elevate your feet for 15 minutes, 3-4 times a day. Limit salt in your diet. Avoid heavy lifting, wear low heal shoes, and practice good posture. Rest a lot with your legs elevated if you have leg cramps or low back pain. Visit your dentist if you have not gone during your pregnancy. Use a soft toothbrush to brush your teeth and be gentle when you floss. A sexual relationship may be continued unless your caregiver directs you otherwise. Do not travel far distances unless it is absolutely necessary and only with the approval of your caregiver. Take prenatal classes to understand, practice, and ask questions about the labor and delivery. Make a trial run to the hospital. Pack your hospital bag. Prepare the baby's nursery. Continue to go to all your prenatal visits as directed by your caregiver. SEEK MEDICAL CARE IF: You are unsure if you are in labor or if your water has broken. You have dizziness. You have mild pelvic cramps, pelvic pressure, or nagging pain in your abdominal area. You have persistent nausea, vomiting, or diarrhea. You have a bad smelling vaginal discharge. You have pain with urination. SEEK IMMEDIATE MEDICAL CARE IF:  You  have a fever. You are leaking fluid from your vagina. You have spotting or bleeding from your vagina. You have severe abdominal cramping or pain. You have rapid weight loss or gain. You have shortness of breath with chest pain. You notice sudden or extreme swelling of your face, hands, ankles, feet, or legs. You have not felt your baby move in over an hour. You have severe headaches that do not go away with medicine. You have vision changes. Document Released: 01/25/2001 Document Revised: 02/05/2013 Document Reviewed: 04/03/2012 [facility] Patient Information 2015 [place], [place]. This information is not intended to replace advice given to you by your health care provider. Make sure you discuss any questions you have with your health care provider.

## 2021-01-27 ENCOUNTER — Other Ambulatory Visit: Payer: Self-pay

## 2021-01-27 ENCOUNTER — Ambulatory Visit (INDEPENDENT_AMBULATORY_CARE_PROVIDER_SITE_OTHER): Payer: Medicaid Other

## 2021-01-27 DIAGNOSIS — Z8751 Personal history of pre-term labor: Secondary | ICD-10-CM

## 2021-01-27 MED ORDER — HYDROXYPROGESTERONE CAPROATE 275 MG/1.1ML ~~LOC~~ SOAJ
275.0000 mg | Freq: Once | SUBCUTANEOUS | Status: AC
  Administered 2021-01-27: 10:00:00 275 mg via SUBCUTANEOUS

## 2021-01-27 NOTE — Progress Notes (Signed)
° °  NURSE VISIT- INJECTION  SUBJECTIVE:  Gina Snow is a 30 y.o. G76P0101 female here for a Makena for history of preterm birth. She is [redacted]w[redacted]d pregnant.   OBJECTIVE:  BP (!) 125/55    Pulse 96    Wt 278 lb 3.2 oz (126.2 kg)    LMP 06/10/2020 (Approximate)    BMI 47.75 kg/m   Appears well, in no apparent distress  Injection administered in: Right arm  Meds ordered this encounter  Medications   HYDROXYprogesterone caproate (Makena) autoinjector 275 mg    ASSESSMENT: Pregnancy [redacted]w[redacted]d Makena for history of preterm birth PLAN: Follow-up: in 1 week for next Makena   Hareem Surowiec A Kyrus Hyde  01/27/2021 9:43 AM

## 2021-01-29 ENCOUNTER — Encounter: Payer: Self-pay | Admitting: Obstetrics & Gynecology

## 2021-02-03 ENCOUNTER — Ambulatory Visit: Payer: Medicaid Other

## 2021-02-10 ENCOUNTER — Ambulatory Visit (INDEPENDENT_AMBULATORY_CARE_PROVIDER_SITE_OTHER): Payer: Medicaid Other | Admitting: Obstetrics & Gynecology

## 2021-02-10 ENCOUNTER — Ambulatory Visit: Payer: Medicaid Other

## 2021-02-10 ENCOUNTER — Other Ambulatory Visit: Payer: Self-pay

## 2021-02-10 ENCOUNTER — Encounter: Payer: Self-pay | Admitting: Obstetrics & Gynecology

## 2021-02-10 VITALS — BP 135/78 | HR 104 | Wt 285.8 lb

## 2021-02-10 DIAGNOSIS — Z6841 Body Mass Index (BMI) 40.0 and over, adult: Secondary | ICD-10-CM | POA: Diagnosis not present

## 2021-02-10 DIAGNOSIS — Z8751 Personal history of pre-term labor: Secondary | ICD-10-CM | POA: Diagnosis not present

## 2021-02-10 DIAGNOSIS — Z3480 Encounter for supervision of other normal pregnancy, unspecified trimester: Secondary | ICD-10-CM

## 2021-02-10 NOTE — Progress Notes (Signed)
° °  LOW-RISK PREGNANCY VISIT Patient name: Gina Snow MRN [id number]  Date of birth: 1990/08/29 Chief Complaint:   Routine Prenatal Visit (17P today)  History of Present Illness:   Gina Snow is a 30 y.o. G52P0101 female at [redacted]w[redacted]d with an Estimated Date of Delivery: 03/17/21 being seen today for ongoing management of a low-risk pregnancy.   Depression screen [facility] 2/9 12/17/2020 09/02/2020 10/03/2017 09/17/2012  Decreased Interest 2 1 0 0  Down, Depressed, Hopeless 0 2 0 1  PHQ - 2 Score 2 3 0 1  Altered sleeping 2 1 1  -  Tired, decreased energy 3 1 2  -  Change in appetite 0 0 1 -  Feeling bad or failure about yourself  0 0 0 -  Trouble concentrating 0 0 0 -  Moving slowly or fidgety/restless 0 0 0 -  Suicidal thoughts 0 0 0 -  PHQ-9 Score 7 5 4  -    Today she reports no complaints. Contractions: Not present. Vag. Bleeding: None.  Movement: Present. denies leaking of fluid. Review of Systems:   Pertinent items are noted in HPI Denies abnormal vaginal discharge w/ itching/odor/irritation, headaches, visual changes, shortness of breath, chest pain, abdominal pain, severe nausea/vomiting, or problems with urination or bowel movements unless otherwise stated above. Pertinent History Reviewed:  Reviewed past medical,surgical, social, obstetrical and family history.  Reviewed problem list, medications and allergies.  Physical Assessment:   Vitals:   02/10/21 1152  BP: 135/78  Pulse: (!) 104  Weight: 285 lb 12.8 oz (129.6 kg)  Body mass index is 49.06 kg/m.        Physical Examination:   General appearance: Well appearing, and in no distress  Mental status: Alert, oriented to person, place, and time  Skin: Warm & dry  Respiratory: Normal respiratory effort, no distress  Abdomen: Soft, gravid, nontender  Pelvic: Cervical exam deferred         Extremities: Edema: Trace  Psych:  mood and affect appropriate  Fetal Status: Fetal Heart Rate (bpm): 150 Fundal Height: 36 cm  Movement: Present    Chaperone: n/a    No results found for this or any previous visit (from the past 24 hour(s)).   Assessment & Plan:  1) Low-risk pregnancy G2P0101 at [redacted]w[redacted]d with an Estimated Date of Delivery: 03/17/21   2) prior PTD- Makena weekly  3) Obesity- BMI 49 []  growth scan in 2-3 wks   Meds: No orders of the defined types were placed in this encounter.  Labs/procedures today: none  Plan:  Continue routine obstetrical care, []  GBS to be collected next visit Next visit: prefers in person    Reviewed: Preterm labor symptoms and general obstetric precautions including but not limited to vaginal bleeding, contractions, leaking of fluid and fetal movement were reviewed in detail with the patient.  All questions were answered. Pt has home bp cuff- encouraged pt to check BP and call if >140/90.  Follow-up: Return in about 1 week (around 02/17/2021) for [place] visit.  No orders of the defined types were placed in this encounter.   Janyth Pupa, DO Attending [place], [facility] for [employer], [place]

## 2021-02-14 NOTE — L&D Delivery Note (Signed)
OB/GYN Faculty Practice Delivery Note  Gina Snow is a 31 y.o. G2P0101 s/p SVD at [redacted]w[redacted]d. She was admitted for elective IOL in setting of suspected macrosomia.   ROM: 18h 28m with clear fluid GBS Status: negative Maximum Maternal Temperature: 100.1  Labor Progress: Presented for IOL, received cytotec and foley balloon. Was started on pitocin and AROMed and after protracted labor course did progress to complete with IUPC placement and pitocin uptitration.   Delivery Date/Time: 1923 on 03/14/2021 Delivery: Presented to room and pushed with patient while patient was complete and pushing. Head delivered LOA. Turtling of head noted immediately after delivery of head. At that time McRoberts maneuver engaged, as well as suprapubic pressure, and ultimately the posterior arm was delivered which lead to successful delivery of shoulder and body  in usual fashion.   Infant with spontaneous cry, placed on mother's abdomen, dried and stimulated. Cord clamped x 2 after 1-minute delay, and cut by father of baby. Cord blood drawn. Placenta delivered spontaneously with gentle cord traction. Fundus firm with massage and Pitocin. Labia, perineum, vagina, and cervix found to be right periurethral laceration which was repaired with three interrupted sutures with 4-0 Monocryl.   Placenta: intact, 3V cord, to L&D Complications: none Lacerations: right periurethral EBL: 100cc Analgesia: epidural   Infant: female   APGARs 8,9   weight pending  Renard Matter, MD, MPH OB Fellow, [place] for [facility], [place] [facility] 03/14/2021, 8:15 PM

## 2021-02-17 ENCOUNTER — Ambulatory Visit (INDEPENDENT_AMBULATORY_CARE_PROVIDER_SITE_OTHER): Payer: Medicaid Other | Admitting: Advanced Practice Midwife

## 2021-02-17 ENCOUNTER — Encounter: Payer: Self-pay | Admitting: Advanced Practice Midwife

## 2021-02-17 ENCOUNTER — Other Ambulatory Visit (HOSPITAL_COMMUNITY)
Admission: RE | Admit: 2021-02-17 | Discharge: 2021-02-17 | Disposition: A | Discharge status: Home / Self Care | Payer: Medicaid Other | Source: Ambulatory Visit | Attending: Advanced Practice Midwife | Admitting: Advanced Practice Midwife

## 2021-02-17 ENCOUNTER — Ambulatory Visit: Payer: Medicaid Other

## 2021-02-17 ENCOUNTER — Other Ambulatory Visit: Payer: Self-pay

## 2021-02-17 VITALS — BP 134/82 | HR 115 | Wt 284.0 lb

## 2021-02-17 DIAGNOSIS — Z3A36 36 weeks gestation of pregnancy: Secondary | ICD-10-CM

## 2021-02-17 DIAGNOSIS — Z3480 Encounter for supervision of other normal pregnancy, unspecified trimester: Secondary | ICD-10-CM

## 2021-02-17 LAB — OB RESULTS CONSOLE GC/CHLAMYDIA: Gonorrhea: NEGATIVE

## 2021-02-17 NOTE — Progress Notes (Addendum)
° °  LOW-RISK PREGNANCY VISIT Patient name: Gina Snow MRN [id number]  Date of birth: 1990/04/04 Chief Complaint:   Routine Prenatal Visit (17P today; GBS, GC/CHL)  History of Present Illness:   Gina Snow is a 31 y.o. G17P0101 female at [redacted]w[redacted]d with an Estimated Date of Delivery: 03/17/21 being seen today for ongoing management of a low-risk pregnancy.  Today she reports  being happy to be finished with [facility], now ready for labor! . Contractions: Irritability. Vag. Bleeding: None.  Movement: Present. denies leaking of fluid. Review of Systems:   Pertinent items are noted in HPI Denies abnormal vaginal discharge w/ itching/odor/irritation, headaches, visual changes, shortness of breath, chest pain, abdominal pain, severe nausea/vomiting, or problems with urination or bowel movements unless otherwise stated above. Pertinent History Reviewed:  Reviewed past medical,surgical, social, obstetrical and family history.  Reviewed problem list, medications and allergies. Physical Assessment:   Vitals:   02/17/21 0917  BP: 134/82  Pulse: (!) 115  Weight: 284 lb (128.8 kg)  Body mass index is 48.75 kg/m.        Physical Examination:   General appearance: Well appearing, and in no distress  Mental status: Alert, oriented to person, place, and time  Skin: Warm & dry  Cardiovascular: Normal heart rate noted  Respiratory: Normal respiratory effort, no distress  Abdomen: Soft, gravid, nontender  Pelvic: Cervical exam performed  Dilation: Closed Effacement (%): Thick Station: Ballotable  Extremities: Edema: Trace  Fetal Status: Fetal Heart Rate (bpm): 147 Fundal Height: 37 cm Movement: Present Presentation: Vertex  No results found for this or any previous visit (from the past 24 hour(s)).  Assessment & Plan:  1) Low-risk pregnancy G2P0101 at [redacted]w[redacted]d with an Estimated Date of Delivery: 03/17/21   2) Hx 34wk del, now 36wks and finished with Makena   Meds: No orders of the defined types  were placed in this encounter.  Labs/procedures today: SVE; GBS & cultures  Plan:  Continue routine obstetrical care   Reviewed: Term labor symptoms and general obstetric precautions including but not limited to vaginal bleeding, contractions, leaking of fluid and fetal movement were reviewed in detail with the patient.  All questions were answered. Has home bp cuff.  Check bp weekly, let us know if >140/90.   Follow-up: Return in about 1 week (around 02/24/2021) for [place], in person.  Orders Placed This Encounter  Procedures   Strep Gp B NAA   Myrtis Ser [facility] 02/17/2021 9:44 AM

## 2021-02-18 ENCOUNTER — Encounter: Payer: Self-pay | Admitting: *Deleted

## 2021-02-18 LAB — CERVICOVAGINAL ANCILLARY ONLY
Chlamydia: NEGATIVE
Comment: NEGATIVE
Comment: NORMAL
Neisseria Gonorrhea: NEGATIVE

## 2021-02-19 ENCOUNTER — Encounter: Payer: Self-pay | Admitting: Advanced Practice Midwife

## 2021-02-19 DIAGNOSIS — B951 Streptococcus, group B, as the cause of diseases classified elsewhere: Secondary | ICD-10-CM | POA: Insufficient documentation

## 2021-02-19 LAB — STREP GP B NAA: Strep Gp B NAA: POSITIVE — AB

## 2021-02-24 ENCOUNTER — Ambulatory Visit (INDEPENDENT_AMBULATORY_CARE_PROVIDER_SITE_OTHER): Payer: Medicaid Other | Admitting: Advanced Practice Midwife

## 2021-02-24 ENCOUNTER — Other Ambulatory Visit: Payer: Self-pay

## 2021-02-24 VITALS — BP 132/72 | HR 84 | Wt 286.0 lb

## 2021-02-24 DIAGNOSIS — Z3480 Encounter for supervision of other normal pregnancy, unspecified trimester: Secondary | ICD-10-CM

## 2021-02-24 DIAGNOSIS — Z3A37 37 weeks gestation of pregnancy: Secondary | ICD-10-CM

## 2021-02-24 MED ORDER — HYDROCORTISONE ACE-PRAMOXINE 1-1 % EX CREA: 1.0000 "application " | TOPICAL_CREAM | Freq: Two times a day (BID) | CUTANEOUS | 1 refills | Status: DC

## 2021-02-24 NOTE — Progress Notes (Signed)
° °  LOW-RISK PREGNANCY VISIT Patient name: Gina Snow MRN [id number]  Date of birth: [birth year] Chief Complaint:   Routine Prenatal Visit  History of Present Illness:   Gina Snow is a 31 y.o. G107P0101 female at [redacted]w[redacted]d with an Estimated Date of Delivery: 03/17/21 being seen today for ongoing management of a low-risk pregnancy.  Today she reports  being ready!; noted 'cyst' at anus after being constipated and using enema . Contractions: Irritability. Vag. Bleeding: None.  Movement: Present. denies leaking of fluid. Review of Systems:   Pertinent items are noted in HPI Denies abnormal vaginal discharge w/ itching/odor/irritation, headaches, visual changes, shortness of breath, chest pain, abdominal pain, severe nausea/vomiting, or problems with urination or bowel movements unless otherwise stated above. Pertinent History Reviewed:  Reviewed past medical,surgical, social, obstetrical and family history.  Reviewed problem list, medications and allergies. Physical Assessment:   Vitals:   02/24/21 1344  BP: 132/72  Pulse: 84  Weight: 286 lb (129.7 kg)  Body mass index is 49.09 kg/m.        Physical Examination:   General appearance: Well appearing, and in no distress  Mental status: Alert, oriented to person, place, and time  Skin: Warm & dry  Cardiovascular: Normal heart rate noted  Respiratory: Normal respiratory effort, no distress  Abdomen: Soft, gravid, nontender  Pelvic: Cervical exam performed  Dilation: Fingertip Effacement (%): Thick Station: Ballotable  Rectal: sm nonthrombosed hemorrhoid noted, 1cm  Extremities: Edema: Trace  Fetal Status: Fetal Heart Rate (bpm): 148 Fundal Height: 39 cm Movement: Present Presentation: Vertex  No results found for this or any previous visit (from the past 24 hour(s)).  Assessment & Plan:  1) Low-risk pregnancy G2P0101 at [redacted]w[redacted]d with an Estimated Date of Delivery: 03/17/21   2) GBS+, ppx in labor with PCN  3) Hemorrhoid, rx  Proctocream; try Tucks and measures to decrease constipation rev'd   Meds:  Meds ordered this encounter  Medications   pramoxine-hydrocortisone (PROCTOCREAM-HC) 1-1 % rectal cream    Sig: Place 1 application rectally 2 (two) times daily.    Dispense:  30 g    Refill:  1    Order Specific Question:   Supervising Provider    Answer:   Tania Ade H [[id number]]   Labs/procedures today: SVE and bedside u/s  Plan:  Continue routine obstetrical care   Reviewed: Term labor symptoms and general obstetric precautions including but not limited to vaginal bleeding, contractions, leaking of fluid and fetal movement were reviewed in detail with the patient.  All questions were answered. Has home bp cuff. Check bp weekly, let us know if >140/90.   Follow-up: Return in about 1 week (around 03/03/2021) for [place], in person.  No orders of the defined types were placed in this encounter.  Myrtis Ser CNM 02/24/2021 2:07 PM

## 2021-03-02 ENCOUNTER — Other Ambulatory Visit: Payer: Self-pay | Admitting: Obstetrics & Gynecology

## 2021-03-02 DIAGNOSIS — O99213 Obesity complicating pregnancy, third trimester: Secondary | ICD-10-CM

## 2021-03-03 ENCOUNTER — Other Ambulatory Visit: Payer: Self-pay

## 2021-03-03 ENCOUNTER — Ambulatory Visit (INDEPENDENT_AMBULATORY_CARE_PROVIDER_SITE_OTHER): Payer: Medicaid Other

## 2021-03-03 ENCOUNTER — Encounter: Payer: Self-pay | Admitting: Women's Health

## 2021-03-03 ENCOUNTER — Ambulatory Visit (INDEPENDENT_AMBULATORY_CARE_PROVIDER_SITE_OTHER): Payer: Medicaid Other | Admitting: Women's Health

## 2021-03-03 VITALS — BP 137/80 | HR 106 | Wt 289.5 lb

## 2021-03-03 DIAGNOSIS — Z3480 Encounter for supervision of other normal pregnancy, unspecified trimester: Secondary | ICD-10-CM

## 2021-03-03 DIAGNOSIS — Z3483 Encounter for supervision of other normal pregnancy, third trimester: Secondary | ICD-10-CM

## 2021-03-03 DIAGNOSIS — O99213 Obesity complicating pregnancy, third trimester: Secondary | ICD-10-CM | POA: Diagnosis not present

## 2021-03-03 DIAGNOSIS — Z3A38 38 weeks gestation of pregnancy: Secondary | ICD-10-CM | POA: Diagnosis not present

## 2021-03-03 DIAGNOSIS — F1290 Cannabis use, unspecified, uncomplicated: Secondary | ICD-10-CM

## 2021-03-03 DIAGNOSIS — B951 Streptococcus, group B, as the cause of diseases classified elsewhere: Secondary | ICD-10-CM

## 2021-03-03 NOTE — Progress Notes (Signed)
[place] 38 wks,cephalic,posterior placenta gr 3,fhr 140 BPM,AFI 15.4 cm,EFW 3965 g 96%,ac 99%

## 2021-03-03 NOTE — Progress Notes (Signed)
LOW-RISK PREGNANCY VISIT Patient name: Gina Snow MRN [id number]  Date of birth: [birth year] Chief Complaint:   Routine Prenatal Visit ([place] today )  History of Present Illness:   Gina Snow is a 31 y.o. G43P0101 female at [redacted]w[redacted]d with an Estimated Date of Delivery: 03/17/21 being seen today for ongoing management of a low-risk pregnancy.   Today she reports no complaints. Contractions: Irritability. Vag. Bleeding: None.  Movement: Present. denies leaking of fluid.  Depression screen [facility] 2/9 12/17/2020 09/02/2020 10/03/2017 09/17/2012  Decreased Interest 2 1 0 0  Down, Depressed, Hopeless 0 2 0 1  PHQ - 2 Score 2 3 0 1  Altered sleeping 2 1 1  -  Tired, decreased energy 3 1 2  -  Change in appetite 0 0 1 -  Feeling bad or failure about yourself  0 0 0 -  Trouble concentrating 0 0 0 -  Moving slowly or fidgety/restless 0 0 0 -  Suicidal thoughts 0 0 0 -  PHQ-9 Score 7 5 4  -     GAD 7 : Generalized Anxiety Score 12/17/2020 09/02/2020  Nervous, Anxious, on Edge 0 0  Control/stop worrying 0 1  Worry too much - different things 0 0  Trouble relaxing 1 1  Restless 0 0  Easily annoyed or irritable 1 0  Afraid - awful might happen 0 0  Total GAD 7 Score 2 2      Review of Systems:   Pertinent items are noted in HPI Denies abnormal vaginal discharge w/ itching/odor/irritation, headaches, visual changes, shortness of breath, chest pain, abdominal pain, severe nausea/vomiting, or problems with urination or bowel movements unless otherwise stated above. Pertinent History Reviewed:  Reviewed past medical,surgical, social, obstetrical and family history.  Reviewed problem list, medications and allergies. Physical Assessment:   Vitals:   03/03/21 0924  BP: 137/80  Pulse: (!) 106  Weight: 289 lb 8 oz (131.3 kg)  Body mass index is 49.69 kg/m.        Physical Examination:   General appearance: Well appearing, and in no distress  Mental status: Alert, oriented to person, place,  and time  Skin: Warm & dry  Cardiovascular: Normal heart rate noted  Respiratory: Normal respiratory effort, no distress  Abdomen: Soft, gravid, nontender  Pelvic: Cervical exam performed  Dilation: 1.5 Effacement (%): 50 Station: Ballotable  Extremities: Edema: Trace  Fetal Status: Fetal Heart Rate (bpm): 140 u/s   Movement: Present Presentation: VertexUS 38 wks,cephalic,posterior placenta gr 3,fhr 140 BPM,AFI 15.4 cm,EFW 3965 g 96%,ac 99%  Chaperone: Levy Pupa   No results found for this or any previous visit (from the past 24 hour(s)).  Assessment & Plan:  1) Low-risk pregnancy G2P0101 at [redacted]w[redacted]d with an Estimated Date of Delivery: 03/17/21   2) Suspected LGA, EFW 96%, normal GTT   Meds: No orders of the defined types were placed in this encounter.  Labs/procedures today: SVE and U/S  Plan:  Continue routine obstetrical care  Next visit: prefers in person    Reviewed: Term labor symptoms and general obstetric precautions including but not limited to vaginal bleeding, contractions, leaking of fluid and fetal movement were reviewed in detail with the patient.  All questions were answered. Does have home bp cuff. Office bp cuff given: not applicable. Check bp weekly, let us know if consistently >140 and/or >90.  Follow-up: Return for As scheduled.  Future Appointments  Date Time Provider Tensed  03/10/2021  8:30 AM Janyth Pupa, DO [facility]  No orders of the defined types were placed in this encounter.  [place], [facility] 03/03/2021 10:22 AM

## 2021-03-03 NOTE — Patient Instructions (Signed)
Gina Snow, thank you for choosing our office today! We appreciate the opportunity to meet your healthcare needs. You may receive a short survey by mail, e-mail, or through [employer]. If you are happy with your care we would appreciate if you could take just a few minutes to complete the survey questions. We read all of your comments and take your feedback very seriously. Thank you again for choosing our office.  Center for [employer] Team at [place] at [facility] ([place], [place] [address]) Entrance C, located off of [address] parking   CLASSES: Go to [email or web address] to register for classes (childbirth, breastfeeding, waterbirth, infant CPR, daddy bootcamp, etc.)  Call the office [phone]) or go to [facility] if: You begin to have strong, frequent contractions Your water breaks.  Sometimes it is a big gush of fluid, sometimes it is just a trickle that keeps getting your panties wet or running down your legs You have vaginal bleeding.  It is normal to have a small amount of spotting if your cervix was checked.  You don't feel your baby moving like normal.  If you don't, get you something to eat and drink and lay down and focus on feeling your baby move.   If your baby is still not moving like normal, you should call the office or go to [facility].  Call the office [phone]) or go to [facility] hospital for these signs of pre-eclampsia: Severe headache that does not go away with Tylenol Visual changes- seeing spots, double, blurred vision Pain under your right breast or upper abdomen that does not go away with Tums or heartburn medicine Nausea and/or vomiting Severe swelling in your hands, feet, and face   [facility] Pediatricians/Family Doctors [facility]): [address] [place], [employer]: [address] [place], [facility]): [place], [phone] (call to ask if accepting patients) [place] Department: [address], [place], [place] [facility]): [address], [place] [facility]: [address] [place], [place] [facility] [place] [facility] [place]: [place], [place] [facility]): [phone] [facility]: [address] [place]: [address], [place] [facility]: [phone], [phone]  Home Blood Pressure Monitoring for Patients   Your provider has recommended that you check your blood pressure (BP) at least once a week at home. If you do not have a blood pressure cuff at home, one will be provided for you. Contact your provider if you have not received your monitor within 1 week.   Helpful Tips for Accurate Home Blood Pressure Checks  Don't smoke, exercise, or drink caffeine 30 minutes before checking your BP Use the restroom before checking your BP (a full bladder can raise your pressure) Relax in a comfortable upright chair Feet on the ground Left arm resting comfortably on a flat surface at the level of your heart Legs uncrossed Back supported Sit quietly and don't talk Place the cuff on your bare arm Adjust snuggly, so that only two fingertips  can fit between your skin and the top of the cuff Check 2 readings separated by at least one minute Keep a log of your BP readings For a visual, please reference this diagram: http://ccnc.care/bpdiagram  Provider Name: [facility]     Phone: [phone]  Zone 1: ALL CLEAR  Continue to monitor your symptoms:  BP reading is less than 140 (top number) or less than 90 (bottom number)  No right  upper stomach pain No headaches or seeing spots No feeling nauseated or throwing up No swelling in face and hands  Zone 2: CAUTION Call your doctor's office for any of the following:  BP reading is greater than 140 (top number) or greater than 90 (bottom number)  Stomach pain under your ribs in the middle or right side Headaches or seeing spots Feeling nauseated or throwing up Swelling in face and hands  Zone 3: EMERGENCY  Seek immediate medical care if you have any of the following:  BP reading is greater than160 (top number) or greater than 110 (bottom number) Severe headaches not improving with Tylenol Serious difficulty catching your breath Any worsening symptoms from Zone 2   Braxton Hicks Contractions Contractions of the uterus can occur throughout pregnancy, but they are not always a sign that you are in labor. You may have practice contractions called Braxton Hicks contractions. These false labor contractions are sometimes confused with true labor. What are [place] contractions? Braxton Hicks contractions are tightening movements that occur in the muscles of the uterus before labor. Unlike true labor contractions, these contractions do not result in opening (dilation) and thinning of the cervix. Toward the end of pregnancy (32-34 weeks), Braxton Hicks contractions can happen more often and may become stronger. These contractions are sometimes difficult to tell apart from true labor because they can be very uncomfortable. You should not feel embarrassed if you go to the hospital with false labor. Sometimes, the only way to tell if you are in true labor is for your health care provider to look for changes in the cervix. The health care provider will do a physical exam and may monitor your contractions. If you are not in true labor, the exam should show that your cervix is not dilating and your water has not broken. If there are no other health problems associated with your  pregnancy, it is completely safe for you to be sent home with false labor. You may continue to have Braxton Hicks contractions until you go into true labor. How to tell the difference between true labor and false labor True labor Contractions last 30-70 seconds. Contractions become very regular. Discomfort is usually felt in the top of the uterus, and it spreads to the lower abdomen and low back. Contractions do not go away with walking. Contractions usually become more intense and increase in frequency. The cervix dilates and gets thinner. False labor Contractions are usually shorter and not as strong as true labor contractions. Contractions are usually irregular. Contractions are often felt in the front of the lower abdomen and in the groin. Contractions may go away when you walk around or change positions while lying down. Contractions get weaker and are shorter-lasting as time goes on. The cervix usually does not dilate or become thin. Follow these instructions at home:  Take over-the-counter and prescription medicines only as told by your health care provider. Keep up with your usual exercises and follow other instructions from your health care provider. Eat and drink lightly if you think  you are going into labor. If Braxton Hicks contractions are making you uncomfortable: Change your position from lying down or resting to walking, or change from walking to resting. Sit and rest in a tub of warm water. Drink enough fluid to keep your urine pale yellow. Dehydration may cause these contractions. Do slow and deep breathing several times an hour. Keep all follow-up prenatal visits as told by your health care provider. This is important. Contact a health care provider if: You have a fever. You have continuous pain in your abdomen. Get help right away if: Your contractions become stronger, more regular, and closer together. You have fluid leaking or gushing from your vagina. You pass  blood-tinged mucus (bloody show). You have bleeding from your vagina. You have low back pain that you never had before. You feel your babys head pushing down and causing pelvic pressure. Your baby is not moving inside you as much as it used to. Summary Contractions that occur before labor are called Braxton Hicks contractions, false labor, or practice contractions. Braxton Hicks contractions are usually shorter, weaker, farther apart, and less regular than true labor contractions. True labor contractions usually become progressively stronger and regular, and they become more frequent. Manage discomfort from [facility] contractions by changing position, resting in a warm bath, drinking plenty of water, or practicing deep breathing. This information is not intended to replace advice given to you by your health care provider. Make sure you discuss any questions you have with your health care provider. Document Revised: 01/13/2017 Document Reviewed: 06/16/2016 Elsevier Patient Education  [place].

## 2021-03-08 ENCOUNTER — Encounter (HOSPITAL_COMMUNITY): Payer: Self-pay | Admitting: Family Medicine

## 2021-03-08 ENCOUNTER — Other Ambulatory Visit: Payer: Self-pay

## 2021-03-08 ENCOUNTER — Inpatient Hospital Stay (HOSPITAL_COMMUNITY)
Admission: AD | Admit: 2021-03-08 | Discharge: 2021-03-08 | Disposition: A | Discharge status: Home / Self Care | Payer: Medicaid Other | Attending: Family Medicine | Admitting: Family Medicine

## 2021-03-08 ENCOUNTER — Encounter: Payer: Self-pay | Admitting: Obstetrics & Gynecology

## 2021-03-08 DIAGNOSIS — Z3A38 38 weeks gestation of pregnancy: Secondary | ICD-10-CM | POA: Insufficient documentation

## 2021-03-08 DIAGNOSIS — F17210 Nicotine dependence, cigarettes, uncomplicated: Secondary | ICD-10-CM | POA: Diagnosis not present

## 2021-03-08 DIAGNOSIS — R519 Headache, unspecified: Secondary | ICD-10-CM | POA: Diagnosis not present

## 2021-03-08 DIAGNOSIS — R030 Elevated blood-pressure reading, without diagnosis of hypertension: Secondary | ICD-10-CM | POA: Insufficient documentation

## 2021-03-08 DIAGNOSIS — O479 False labor, unspecified: Secondary | ICD-10-CM

## 2021-03-08 DIAGNOSIS — O471 False labor at or after 37 completed weeks of gestation: Secondary | ICD-10-CM | POA: Insufficient documentation

## 2021-03-08 DIAGNOSIS — O99333 Smoking (tobacco) complicating pregnancy, third trimester: Secondary | ICD-10-CM | POA: Insufficient documentation

## 2021-03-08 DIAGNOSIS — B951 Streptococcus, group B, as the cause of diseases classified elsewhere: Secondary | ICD-10-CM

## 2021-03-08 DIAGNOSIS — F1290 Cannabis use, unspecified, uncomplicated: Secondary | ICD-10-CM

## 2021-03-08 DIAGNOSIS — O26893 Other specified pregnancy related conditions, third trimester: Secondary | ICD-10-CM

## 2021-03-08 DIAGNOSIS — Z3480 Encounter for supervision of other normal pregnancy, unspecified trimester: Secondary | ICD-10-CM

## 2021-03-08 LAB — CBC
HCT: 34.6 % — ABNORMAL LOW (ref 36.0–46.0)
Hemoglobin: 11.5 g/dL — ABNORMAL LOW (ref 12.0–15.0)
MCH: 31.4 pg (ref 26.0–34.0)
MCHC: 33.2 g/dL (ref 30.0–36.0)
MCV: 94.5 fL (ref 80.0–100.0)
Platelets: 225 10*3/uL (ref 150–400)
RBC: 3.66 MIL/uL — ABNORMAL LOW (ref 3.87–5.11)
RDW: 15 % (ref 11.5–15.5)
WBC: 12.7 10*3/uL — ABNORMAL HIGH (ref 4.0–10.5)
nRBC: 0 % (ref 0.0–0.2)

## 2021-03-08 LAB — COMPREHENSIVE METABOLIC PANEL
ALT: 13 U/L (ref 0–44)
AST: 14 U/L — ABNORMAL LOW (ref 15–41)
Albumin: 2.8 g/dL — ABNORMAL LOW (ref 3.5–5.0)
Alkaline Phosphatase: 117 U/L (ref 38–126)
Anion gap: 8 (ref 5–15)
BUN: 6 mg/dL (ref 6–20)
CO2: 24 mmol/L (ref 22–32)
Calcium: 8.8 mg/dL — ABNORMAL LOW (ref 8.9–10.3)
Chloride: 105 mmol/L (ref 98–111)
Creatinine, Ser: 0.72 mg/dL (ref 0.44–1.00)
GFR, Estimated: >60 mL/min (ref >60)
Glucose, Bld: 138 mg/dL — ABNORMAL HIGH (ref 70–99)
Potassium: 3.7 mmol/L (ref 3.5–5.1)
Sodium: 137 mmol/L (ref 135–145)
Total Bilirubin: 0.4 mg/dL (ref 0.3–1.2)
Total Protein: 5.7 g/dL — ABNORMAL LOW (ref 6.5–8.1)

## 2021-03-08 LAB — PROTEIN / CREATININE RATIO, URINE
Creatinine, Urine: 49.77 mg/dL
Protein Creatinine Ratio: 0.14 mg/mg{Cre} (ref 0.00–0.15)
Total Protein, Urine: 7 mg/dL

## 2021-03-08 MED ORDER — ACETAMINOPHEN 500 MG PO TABS
1000.0000 mg | ORAL_TABLET | Freq: Once | ORAL | Status: AC
Start: 2021-03-08 — End: 2021-03-08
  Administered 2021-03-08: 1000 mg via ORAL
  Filled 2021-03-08: qty 2

## 2021-03-08 NOTE — MAU Note (Signed)
Pt reports she is having lower abd pain and cramping and increased pelvic pressure. Has been having it all week has just gotten more constant. Denies any vag bleeding or leaking at this time. 1.5 cm on last exam.

## 2021-03-08 NOTE — MAU Provider Note (Signed)
History     CSN: [id number]  Arrival date and time: 03/08/21 1645   None     Chief Complaint  Patient presents with   Labor Eval   HPI Gina Snow is a 31 y.o. G2P0101 at [redacted]w[redacted]d whos presents to MAU for contractions. Patient reports contractions started last night and are about every 77mins apart. She denies leaking fluid or vaginal bleeding. Endorses active fetal movement. Upon arrival to MAU, BP was noted to be elevated. Patient reported to RN that she has a headache, but denies vision changes or RUQ/epigastric pain. She reports she took Tylenol this morning, but it did not help her headache.  OB History     Gravida  2   Para  1   Term  0   Preterm  1   AB  0   Living  1      SAB  0   IAB  0   Ectopic  0   Multiple      Live Births  1           Past Medical History:  Diagnosis Date   Asthma    Obesity    PCO (polycystic ovaries)     Past Surgical History:  Procedure Laterality Date   NO PAST SURGERIES      Family History  Problem Relation Age of Onset   Clotting disorder Mother 60       had "brain" clot?   Hypertension Maternal Grandmother    Diabetes Maternal Grandmother    Thyroid disease Maternal Grandmother    Hypertension Maternal Grandfather    Diabetes Paternal Grandmother     Social History   Tobacco Use   Smoking status: Every Day    Packs/day: 0.25    Years: 8.00    Pack years: 2.00    Types: Cigarettes   Smokeless tobacco: Never   Tobacco comments:    4/5 per day  Vaping Use   Vaping Use: Never used  Substance Use Topics   Alcohol use: Not Currently    Comment: every once in a while   Drug use: No    Allergies: No Known Allergies  Medications Prior to Admission  Medication Sig Dispense Refill Last Dose   aspirin 81 MG chewable tablet Chew 2 tablets (162 mg total) by mouth daily. 60 tablet 7 03/07/2021   Prenat w/o A-FeCbGl-DSS-FA-DHA (CITRANATAL ASSURE) 35-1 & 300 MG tablet One tablet and one capsule daily  90 each 3 03/08/2021   acetaminophen (TYLENOL) 500 MG tablet Take 500 mg by mouth every 6 (six) hours as needed for mild pain.      albuterol (PROVENTIL HFA;VENTOLIN HFA) 108 (90 Base) MCG/ACT inhaler Inhale 1-2 puffs into the lungs every 6 (six) hours as needed for wheezing or shortness of breath. 1 Inhaler 0    pramoxine-hydrocortisone (PROCTOCREAM-HC) 1-1 % rectal cream Place 1 application rectally 2 (two) times daily. (Patient taking differently: Place 1 application rectally as needed.) 30 g 1     Review of Systems  Constitutional: Negative.   Respiratory: Negative.    Cardiovascular: Negative.   Gastrointestinal:  Positive for abdominal pain.  Genitourinary: Negative.   Neurological:  Positive for headaches.   Physical Exam   Patient Vitals for the past 24 hrs:  BP Temp Pulse Resp  03/08/21 1915 (!) 120/59 -- (!) 108 --  03/08/21 1900 114/72 -- (!) 104 --  03/08/21 1845 (!) 105/56 -- (!) 103 --  03/08/21 1830 (!) 112/47 -- 97 --  03/08/21 1815 (!) 120/43 -- 97 --  03/08/21 1801 (!) 127/52 -- 100 --  03/08/21 1746 (!) 124/54 -- (!) 109 --  03/08/21 1737 121/65 -- (!) 120 --  03/08/21 1727 140/68 98.8 F (37.1 C) (!) 118 18    Physical Exam Vitals and nursing note reviewed.  Constitutional:      General: She is not in acute distress.    Appearance: She is obese.  Eyes:     Extraocular Movements: Extraocular movements intact.     Pupils: Pupils are equal, round, and reactive to light.  Cardiovascular:     Rate and Rhythm: Tachycardia present.  Pulmonary:     Effort: Pulmonary effort is normal.  Abdominal:     Palpations: Abdomen is soft.     Tenderness: There is no abdominal tenderness.     Comments: Gravid   Genitourinary:    Comments: VE: 1.5/50/Ballotable by RN Musculoskeletal:        General: Normal range of motion.     Cervical back: Normal range of motion.  Skin:    General: Skin is warm and dry.  Neurological:     General: No focal deficit present.      Mental Status: She is alert and oriented to person, place, and time.  Psychiatric:        Mood and Affect: Mood normal.        Behavior: Behavior normal.        Thought Content: Thought content normal.   NST FHR: 140 bpm, moderate variability, +15x15 accels, no decels  [place]oco: Q 4-6 mins  MAU Course  Procedures CBC, CMP, UPCR Tylenol PO NST  MDM Initial BP elevated, all others normotensive. Labs reassuring. Headache resolved with Tylenol. Cervix remains unchanged after 2 hours. NST reactive and reassuring. At this time I feel patient is not in active labor or has hypertensive disorder in pregnancy and is stable for discharge home.   Assessment and Plan  [redacted] weeks gestation of pregnancy Braxton hicks contractions Headache Elevated blood pressure without diagnosis of hypertension  - Discharge home in stable condition - Strict return precautions reviewed at length. Patient to return to MAU sooner or as needed for worsening symptoms - Patient to keep OB appointment as scheduled on 03/10/2021   Renee Harder, CNM 03/08/2021, 8:41 PM

## 2021-03-10 ENCOUNTER — Encounter: Payer: Medicaid Other | Admitting: Obstetrics & Gynecology

## 2021-03-12 ENCOUNTER — Encounter: Payer: Self-pay | Admitting: Family Medicine

## 2021-03-12 ENCOUNTER — Other Ambulatory Visit: Payer: Self-pay

## 2021-03-12 ENCOUNTER — Other Ambulatory Visit: Payer: Self-pay | Admitting: Family Medicine

## 2021-03-12 ENCOUNTER — Ambulatory Visit (INDEPENDENT_AMBULATORY_CARE_PROVIDER_SITE_OTHER): Payer: Medicaid Other | Admitting: Obstetrics & Gynecology

## 2021-03-12 ENCOUNTER — Encounter: Payer: Self-pay | Admitting: Obstetrics & Gynecology

## 2021-03-12 VITALS — BP 126/78 | HR 120 | Wt 291.0 lb

## 2021-03-12 DIAGNOSIS — Z3480 Encounter for supervision of other normal pregnancy, unspecified trimester: Secondary | ICD-10-CM

## 2021-03-12 NOTE — Progress Notes (Signed)
° °  LOW-RISK PREGNANCY VISIT Patient name: Gina Snow MRN [id number]  Date of birth: 04-Jun-1990 Chief Complaint:   Routine Prenatal Visit  History of Present Illness:   Gina Snow is a 31 y.o. G61P0101 female at [redacted]w[redacted]d with an Estimated Date of Delivery: 03/17/21 being seen today for ongoing management of a low-risk pregnancy.  Depression screen [facility] 2/9 12/17/2020 09/02/2020 10/03/2017 09/17/2012  Decreased Interest 2 1 0 0  Down, Depressed, Hopeless 0 2 0 1  PHQ - 2 Score 2 3 0 1  Altered sleeping 2 1 1  -  Tired, decreased energy 3 1 2  -  Change in appetite 0 0 1 -  Feeling bad or failure about yourself  0 0 0 -  Trouble concentrating 0 0 0 -  Moving slowly or fidgety/restless 0 0 0 -  Suicidal thoughts 0 0 0 -  PHQ-9 Score 7 5 4  -    Today she reports occasional contractions. Contractions: Irregular. Vag. Bleeding: None.  Movement: Present. denies leaking of fluid. Review of Systems:   Pertinent items are noted in HPI Denies abnormal vaginal discharge w/ itching/odor/irritation, headaches, visual changes, shortness of breath, chest pain, abdominal pain, severe nausea/vomiting, or problems with urination or bowel movements unless otherwise stated above. Pertinent History Reviewed:  Reviewed past medical,surgical, social, obstetrical and family history.  Reviewed problem list, medications and allergies.  Physical Assessment:   Vitals:   03/12/21 0958  BP: 126/78  Pulse: (!) 120  Weight: 291 lb (132 kg)  Body mass index is 49.95 kg/m.        Physical Examination:   General appearance: Well appearing, and in no distress  Mental status: Alert, oriented to person, place, and time  Skin: Warm & dry  Respiratory: Normal respiratory effort, no distress  Abdomen: Soft, gravid, nontender  Pelvic: Cervical exam performed  Dilation: 1.5 Effacement (%): 50 Station: -3  Extremities: Edema: Trace  Psych:  mood and affect appropriate  Fetal Status: Fetal Heart Rate (bpm): 145  Fundal Height: 42 cm Movement: Present Presentation: Vertex  Chaperone:  declined     No results found for this or any previous visit (from the past 24 hour(s)).   Assessment & Plan:  1) Low-risk pregnancy G2P0101 at [redacted]w[redacted]d with an Estimated Date of Delivery: 03/17/21   Elective IOL  scheduled for tomorrow   Meds: No orders of the defined types were placed in this encounter.  Labs/procedures today: doppler  Plan:  Continue routine obstetrical care  Next visit: prefers in person    Reviewed: Term labor symptoms and general obstetric precautions including but not limited to vaginal bleeding, contractions, leaking of fluid and fetal movement were reviewed in detail with the patient.  All questions were answered. Pt has home bp cuff. Check bp weekly, let us know if >140/90.   Follow-up: Return in about 5 weeks (around 04/16/2021) for postpartum visit.  No orders of the defined types were placed in this encounter.   Janyth Pupa, DO Attending [place], [facility] for [employer], Day

## 2021-03-13 ENCOUNTER — Inpatient Hospital Stay (HOSPITAL_COMMUNITY): Payer: Medicaid Other | Admitting: Anesthesiology

## 2021-03-13 ENCOUNTER — Inpatient Hospital Stay (HOSPITAL_COMMUNITY)
Admission: AD | Admit: 2021-03-13 | Discharge: 2021-03-16 | DRG: 807 | Disposition: A | Discharge status: Home / Self Care | Payer: Medicaid Other | Attending: Obstetrics & Gynecology | Admitting: Obstetrics & Gynecology

## 2021-03-13 ENCOUNTER — Encounter (HOSPITAL_COMMUNITY): Payer: Self-pay | Admitting: Obstetrics and Gynecology

## 2021-03-13 ENCOUNTER — Other Ambulatory Visit: Payer: Self-pay

## 2021-03-13 ENCOUNTER — Inpatient Hospital Stay (HOSPITAL_COMMUNITY): Payer: Medicaid Other

## 2021-03-13 DIAGNOSIS — R7303 Prediabetes: Secondary | ICD-10-CM | POA: Diagnosis present

## 2021-03-13 DIAGNOSIS — Z20822 Contact with and (suspected) exposure to covid-19: Secondary | ICD-10-CM | POA: Diagnosis present

## 2021-03-13 DIAGNOSIS — F1290 Cannabis use, unspecified, uncomplicated: Secondary | ICD-10-CM | POA: Diagnosis present

## 2021-03-13 DIAGNOSIS — O3663X0 Maternal care for excessive fetal growth, third trimester, not applicable or unspecified: Secondary | ICD-10-CM | POA: Diagnosis present

## 2021-03-13 DIAGNOSIS — J45909 Unspecified asthma, uncomplicated: Secondary | ICD-10-CM | POA: Diagnosis present

## 2021-03-13 DIAGNOSIS — O9952 Diseases of the respiratory system complicating childbirth: Secondary | ICD-10-CM | POA: Diagnosis present

## 2021-03-13 DIAGNOSIS — O99324 Drug use complicating childbirth: Secondary | ICD-10-CM | POA: Diagnosis present

## 2021-03-13 DIAGNOSIS — Z3490 Encounter for supervision of normal pregnancy, unspecified, unspecified trimester: Secondary | ICD-10-CM

## 2021-03-13 DIAGNOSIS — Z30017 Encounter for initial prescription of implantable subdermal contraceptive: Secondary | ICD-10-CM

## 2021-03-13 DIAGNOSIS — O26893 Other specified pregnancy related conditions, third trimester: Secondary | ICD-10-CM | POA: Diagnosis present

## 2021-03-13 DIAGNOSIS — Z8742 Personal history of other diseases of the female genital tract: Secondary | ICD-10-CM

## 2021-03-13 DIAGNOSIS — O24420 Gestational diabetes mellitus in childbirth, diet controlled: Secondary | ICD-10-CM | POA: Diagnosis present

## 2021-03-13 DIAGNOSIS — O99334 Smoking (tobacco) complicating childbirth: Secondary | ICD-10-CM | POA: Diagnosis present

## 2021-03-13 DIAGNOSIS — Z3A39 39 weeks gestation of pregnancy: Secondary | ICD-10-CM | POA: Diagnosis not present

## 2021-03-13 DIAGNOSIS — F17210 Nicotine dependence, cigarettes, uncomplicated: Secondary | ICD-10-CM | POA: Diagnosis present

## 2021-03-13 DIAGNOSIS — O99824 Streptococcus B carrier state complicating childbirth: Secondary | ICD-10-CM | POA: Diagnosis present

## 2021-03-13 DIAGNOSIS — B951 Streptococcus, group B, as the cause of diseases classified elsewhere: Secondary | ICD-10-CM | POA: Diagnosis present

## 2021-03-13 DIAGNOSIS — O99814 Abnormal glucose complicating childbirth: Secondary | ICD-10-CM | POA: Diagnosis not present

## 2021-03-13 DIAGNOSIS — Z3480 Encounter for supervision of other normal pregnancy, unspecified trimester: Secondary | ICD-10-CM

## 2021-03-13 LAB — TYPE AND SCREEN
ABO/RH(D): O POS
Antibody Screen: NEGATIVE

## 2021-03-13 LAB — CBC
HCT: 36.4 % (ref 36.0–46.0)
Hemoglobin: 12.3 g/dL (ref 12.0–15.0)
MCH: 32 pg (ref 26.0–34.0)
MCHC: 33.8 g/dL (ref 30.0–36.0)
MCV: 94.8 fL (ref 80.0–100.0)
Platelets: 223 10*3/uL (ref 150–400)
RBC: 3.84 MIL/uL — ABNORMAL LOW (ref 3.87–5.11)
RDW: 15 % (ref 11.5–15.5)
WBC: 14.2 10*3/uL — ABNORMAL HIGH (ref 4.0–10.5)
nRBC: 0 % (ref 0.0–0.2)

## 2021-03-13 LAB — RESP PANEL BY RT-PCR (FLU A&B, COVID) ARPGX2
Influenza A by PCR: NEGATIVE
Influenza B by PCR: NEGATIVE
SARS Coronavirus 2 by RT PCR: NEGATIVE

## 2021-03-13 LAB — RPR: RPR Ser Ql: NONREACTIVE

## 2021-03-13 MED ORDER — OXYTOCIN-SODIUM CHLORIDE 30-0.9 UT/500ML-% IV SOLN
1.0000 m[IU]/min | INTRAVENOUS | Status: DC
  Administered 2021-03-13: 2 m[IU]/min via INTRAVENOUS

## 2021-03-13 MED ORDER — EPHEDRINE 5 MG/ML INJ: 10.0000 mg | INTRAVENOUS | Status: DC | PRN

## 2021-03-13 MED ORDER — ACETAMINOPHEN 325 MG PO TABS
650.0000 mg | ORAL_TABLET | ORAL | Status: DC | PRN
  Filled 2021-03-13: qty 2

## 2021-03-13 MED ORDER — FENTANYL-BUPIVACAINE-NACL 0.5-0.125-0.9 MG/250ML-% EP SOLN
EPIDURAL | Status: DC | PRN
  Administered 2021-03-13: 12 mL/h via EPIDURAL

## 2021-03-13 MED ORDER — SOD CITRATE-CITRIC ACID 500-334 MG/5ML PO SOLN: 30.0000 mL | ORAL | Status: DC | PRN

## 2021-03-13 MED ORDER — OXYTOCIN BOLUS FROM INFUSION
333.0000 mL | Freq: Once | INTRAVENOUS | Status: AC
  Administered 2021-03-14: 333 mL via INTRAVENOUS

## 2021-03-13 MED ORDER — OXYCODONE-ACETAMINOPHEN 5-325 MG PO TABS
2.0000 | ORAL_TABLET | ORAL | Status: DC | PRN
  Administered 2021-03-14: 2 via ORAL
  Filled 2021-03-13: qty 2

## 2021-03-13 MED ORDER — TERBUTALINE SULFATE 1 MG/ML IJ SOLN: 0.2500 mg | Freq: Once | INTRAMUSCULAR | Status: DC | PRN

## 2021-03-13 MED ORDER — PHENYLEPHRINE 40 MCG/ML (10ML) SYRINGE FOR IV PUSH (FOR BLOOD PRESSURE SUPPORT): 80.0000 ug | PREFILLED_SYRINGE | INTRAVENOUS | Status: DC | PRN

## 2021-03-13 MED ORDER — LACTATED RINGERS IV SOLN
500.0000 mL | Freq: Once | INTRAVENOUS | Status: AC
  Administered 2021-03-13: 500 mL via INTRAVENOUS

## 2021-03-13 MED ORDER — LACTATED RINGERS IV SOLN
500.0000 mL | INTRAVENOUS | Status: DC | PRN
  Administered 2021-03-13: 1000 mL via INTRAVENOUS

## 2021-03-13 MED ORDER — MISOPROSTOL 50MCG HALF TABLET
50.0000 ug | ORAL_TABLET | ORAL | Status: DC | PRN
  Administered 2021-03-13 (×2): 50 ug via ORAL
  Filled 2021-03-13 (×2): qty 1

## 2021-03-13 MED ORDER — LIDOCAINE HCL (PF) 1 % IJ SOLN
INTRAMUSCULAR | Status: DC | PRN
  Administered 2021-03-13: 10 mL via EPIDURAL
  Administered 2021-03-13: 2 mL via EPIDURAL

## 2021-03-13 MED ORDER — LIDOCAINE HCL (PF) 1 % IJ SOLN: 30.0000 mL | INTRAMUSCULAR | Status: DC | PRN

## 2021-03-13 MED ORDER — DIPHENHYDRAMINE HCL 50 MG/ML IJ SOLN
12.5000 mg | INTRAMUSCULAR | Status: AC | PRN
  Administered 2021-03-14 (×3): 12.5 mg via INTRAVENOUS
  Filled 2021-03-13 (×2): qty 1

## 2021-03-13 MED ORDER — SODIUM CHLORIDE 0.9 % IV SOLN
5.0000 10*6.[IU] | Freq: Once | INTRAVENOUS | Status: AC
  Administered 2021-03-13: 5 10*6.[IU] via INTRAVENOUS
  Filled 2021-03-13: qty 5

## 2021-03-13 MED ORDER — PENICILLIN G POT IN DEXTROSE 60000 UNIT/ML IV SOLN
3.0000 10*6.[IU] | INTRAVENOUS | Status: DC
  Administered 2021-03-13 – 2021-03-14 (×7): 3 10*6.[IU] via INTRAVENOUS
  Filled 2021-03-13 (×8): qty 50

## 2021-03-13 MED ORDER — OXYTOCIN-SODIUM CHLORIDE 30-0.9 UT/500ML-% IV SOLN
2.5000 [IU]/h | INTRAVENOUS | Status: DC
  Administered 2021-03-14: 2.5 [IU]/h via INTRAVENOUS
  Filled 2021-03-13 (×2): qty 500

## 2021-03-13 MED ORDER — LACTATED RINGERS IV SOLN: INTRAVENOUS | Status: DC

## 2021-03-13 MED ORDER — OXYCODONE-ACETAMINOPHEN 5-325 MG PO TABS: 1.0000 | ORAL_TABLET | ORAL | Status: DC | PRN

## 2021-03-13 MED ORDER — FENTANYL-BUPIVACAINE-NACL 0.5-0.125-0.9 MG/250ML-% EP SOLN
12.0000 mL/h | EPIDURAL | Status: DC | PRN
  Administered 2021-03-14: 12 mL/h via EPIDURAL
  Filled 2021-03-13 (×2): qty 250

## 2021-03-13 MED ORDER — ONDANSETRON HCL 4 MG/2ML IJ SOLN
4.0000 mg | Freq: Four times a day (QID) | INTRAMUSCULAR | Status: DC | PRN
  Administered 2021-03-14: 4 mg via INTRAVENOUS
  Filled 2021-03-13: qty 2

## 2021-03-13 NOTE — Progress Notes (Signed)
Pt sitting up to eat.  EFM not recording FHR

## 2021-03-13 NOTE — Progress Notes (Signed)
Gina Snow is a 31 y.o. G2P0101 at [redacted]w[redacted]d  admitted for induction of labor due to Elective at term.  Subjective: Reports feeling more uncomfortable with contractions. Requesting epidural  Objective: BP (!) 119/53 (BP Location: Right Arm)    Pulse 87    Temp 98.7 F (37.1 C) (Oral)    Resp 18    Ht 5\' 4"  (1.626 m)    Wt 132.7 kg    LMP 06/10/2020 (Approximate)    BMI 50.22 kg/m  No intake/output data recorded. No intake/output data recorded.  FHT:  FHR: 140 bpm, variability: moderate,  accelerations:  Present,  decelerations:  Absent UC: Q 2-5 mins SVE:   Dilation: 4.5 Effacement (%): 50 Station: -3 Exam by:: Maryagnes Amos, CNM  Labs: Lab Results  Component Value Date   WBC 14.2 (H) 03/13/2021   HGB 12.3 03/13/2021   HCT 36.4 03/13/2021   MCV 94.8 03/13/2021   PLT 223 03/13/2021    Assessment / Plan: G2P0101 at [redacted]w[redacted]d  admitted for induction of labor due to Elective at term for suspected macrosomia.  Labor: currently on Pitocin 10 mu/min. Continue titration prn per unit policy. Patient requesting epidural at this time. Will hold off on AROM until comfortable with epidural and head well applied to cervix. Fetal Wellbeing:  Category I I/D:   GBS pos>PCN    Renee Harder, CNM 03/13/2021, 9:30 PM

## 2021-03-13 NOTE — Anesthesia Preprocedure Evaluation (Signed)
Anesthesia Evaluation  Patient identified by MRN, date of birth, ID band Patient awake    Reviewed: Allergy & Precautions, Patient's Chart, lab work & pertinent test results  Airway Mallampati: II  TM Distance: >3 FB Neck ROM: Full    Dental no notable dental hx.    Pulmonary asthma (well controlled) , Current Smoker,    Pulmonary exam normal breath sounds clear to auscultation       Cardiovascular negative cardio ROS Normal cardiovascular exam Rhythm:Regular Rate:Normal     Neuro/Psych negative neurological ROS  negative psych ROS   GI/Hepatic negative GI ROS, Neg liver ROS,   Endo/Other  Morbid obesityBMI 50  Renal/GU negative Renal ROS  negative genitourinary   Musculoskeletal negative musculoskeletal ROS (+)   Abdominal (+) + obese,   Peds negative pediatric ROS (+)  Hematology negative hematology ROS (+) hct 36.4, plt 223   Anesthesia Other Findings   Reproductive/Obstetrics (+) Pregnancy                             Anesthesia Physical Anesthesia Plan  ASA: 3  Anesthesia Plan: Epidural   Post-op Pain Management:    Induction:   PONV Risk Score and Plan: 2  Airway Management Planned: Natural Airway  Additional Equipment: None  Intra-op Plan:   Post-operative Plan:   Informed Consent: I have reviewed the patients History and Physical, chart, labs and discussed the procedure including the risks, benefits and alternatives for the proposed anesthesia with the patient or authorized representative who has indicated his/her understanding and acceptance.       Plan Discussed with:   Anesthesia Plan Comments:         Anesthesia Quick Evaluation

## 2021-03-13 NOTE — Progress Notes (Signed)
Pt sitting up to eat.  EFM not tracing

## 2021-03-13 NOTE — H&P (Signed)
OBSTETRIC ADMISSION HISTORY AND PHYSICAL  NANCY ARVIN is a 31 y.o. female G5P0101 with IUP at [redacted]w[redacted]d by LMP presenting for elective IOL. She reports +FMs, No LOF, no VB, no blurry vision, headaches or peripheral edema, and RUQ pain.  She plans on breast and bottle feeding. She is considering Nexplanon or IUD for birth control. She received her prenatal care at  [facility]    Dating: By LMP --->  Estimated Date of Delivery: 03/17/21  Sono:   @[redacted]w[redacted]d , CWD, normal anatomy, cephalic presentation, posterior placental lie, 3965g, 96%ile EFW with AC 99%ile   Prenatal History/Complications:  Prediabetes GBS pos Suspected microsomia (96%ile with AC 99%ile)  Past Medical History: Past Medical History:  Diagnosis Date   Asthma    Obesity    PCO (polycystic ovaries)     Past Surgical History: Past Surgical History:  Procedure Laterality Date   NO PAST SURGERIES      Obstetrical History: OB History     Gravida  2   Para  1   Term  0   Preterm  1   AB  0   Living  1      SAB  0   IAB  0   Ectopic  0   Multiple      Live Births  1           Social History Social History   Socioeconomic History   Marital status: Single    Spouse name: Buzzy Han   Number of children: 1   Years of education: 12   Highest education level: High school graduate  Occupational History   Occupation: cna    Comment: [employer]  Tobacco Use   Smoking status: Every Day    Packs/day: 0.25    Years: 8.00    Pack years: 2.00    Types: Cigarettes   Smokeless tobacco: Never   Tobacco comments:    4/5 per day  Vaping Use   Vaping Use: Never used  Substance and Sexual Activity   Alcohol use: Not Currently    Comment: every once in a while   Drug use: No   Sexual activity: Yes    Birth control/protection: None  Other Topics Concern   Not on file  Social History Narrative   ** Merged History Encounter **       Social Determinants of Health   Financial Resource  Strain: Medium Risk   Difficulty of Paying Living Expenses: Somewhat hard  Food Insecurity: Landscape architect Present   Worried About Charity fundraiser in the Last Year: Sometimes true   Ran Out of Food in the Last Year: Sometimes true  Transportation Needs: No Transportation Needs   Lack of Transportation (Medical): No   Lack of Transportation (Non-Medical): No  Physical Activity: Sufficiently Active   Days of Exercise per Week: 5 days   Minutes of Exercise per Session: 90 min  Stress: No Stress Concern Present   Feeling of Stress : Not at all  Social Connections: Moderately Isolated   Frequency of Communication with Friends and Family: Three times a week   Frequency of Social Gatherings with Friends and Family: Three times a week   Attends Religious Services: 1 to 4 times per year   Active Member of Clubs or Organizations: No   Attends Archivist Meetings: Never   Marital Status: Never married    Family History: Family History  Problem Relation Age of Onset   Clotting disorder Mother  34       had "brain" clot?   Hypertension Maternal Grandmother    Diabetes Maternal Grandmother    Thyroid disease Maternal Grandmother    Hypertension Maternal Grandfather    Diabetes Paternal Grandmother     Allergies: No Known Allergies  Medications Prior to Admission  Medication Sig Dispense Refill Last Dose   acetaminophen (TYLENOL) 500 MG tablet Take 500 mg by mouth every 6 (six) hours as needed for mild pain.      albuterol (PROVENTIL HFA;VENTOLIN HFA) 108 (90 Base) MCG/ACT inhaler Inhale 1-2 puffs into the lungs every 6 (six) hours as needed for wheezing or shortness of breath. 1 Inhaler 0    aspirin 81 MG chewable tablet Chew 2 tablets (162 mg total) by mouth daily. 60 tablet 7    pramoxine-hydrocortisone (PROCTOCREAM-HC) 1-1 % rectal cream Place 1 application rectally 2 (two) times daily. (Patient taking differently: Place 1 application rectally as needed.) 30 g 1     Prenat w/o A-[id number]-DSS-FA-DHA (CITRANATAL ASSURE) 35-1 & 300 MG tablet One tablet and one capsule daily 90 each 3      Review of Systems   All systems reviewed and negative except as stated in HPI  Blood pressure 126/61, pulse 91, temperature 97.7 F (36.5 C), temperature source Oral, resp. rate 20, height 5\' 4"  (1.626 m), weight 132.7 kg, last menstrual period 06/10/2020. General appearance: alert Lungs: clear to auscultation bilaterally Heart: regular rate and rhythm Abdomen: soft, non-tender; bowel sounds normal Extremities: Homans sign is negative, no sign of DVT Presentation: cephalic Fetal monitoringBaseline: 140 bpm, Variability: Good {> 6 bpm), Accelerations: Reactive, and Decelerations: Absent Uterine activity irregular     Prenatal labs: ABO, Rh: O/Positive/-- (07/20 1217) Antibody: Negative (11/03 0847) Rubella: 7.36 (07/20 1217) RPR: Non Reactive (11/03 0847)  HBsAg: Negative (07/20 1217)  HIV: Non Reactive (11/03 0847)  GBS: Positive/-- (01/04 1019)  2 hr Glucola normal Genetic screening  LR NIPS Anatomy US normal  Prenatal Transfer Tool  Maternal Diabetes: No Genetic Screening: Normal Maternal Ultrasounds/Referrals: Normal Fetal Ultrasounds or other Referrals:  None Maternal Substance Abuse:  No Significant Maternal Medications:  None Significant Maternal Lab Results: Group B Strep positive  No results found for this or any previous visit (from the past 24 hour(s)).  Patient Active Problem List   Diagnosis Date Noted   Supervision of other normal pregnancy, antepartum 03/13/2021   Positive GBS test 02/19/2021   Marijuana use 09/09/2020   Encounter for supervision of normal pregnancy, antepartum 09/02/2020   History of preterm delivery 04/16/2018   History of abnormal cervical Pap smear 01/03/2018   PCOS (polycystic ovarian syndrome) 12/21/2012   Prediabetes 10/26/2012   Tobacco use 09/17/2012    Assessment/Plan:  DARSHANA CURNUTT is a 31 y.o.  G2P0101 at [redacted]w[redacted]d here for elective IOL  #Labor: Patient not feeling any contractions. In office was 1.5 cm and membranes stripped per patient. At admission cervix 2 cm and 50%effaced. Foley balloon placed and will do buccal cytotec.  #Pain: Plans epidural #FWB: Cat I #ID:  GBS pos>PCN #MOF: breast and formula #MOC:deciding between Nexplanon and IUD. Discussed option of PP IUD. Patient still contemplative. Will check in at next assessment. #Circ:  N/a  #Suspected macrosomia EFW 96%ile with AC 99%ile. Will plan accordingly for delivery and discuss with patient regarding risks of shoulder dystocia and set expectations for extra staffing at delivery.  Renard Matter, MD  03/13/2021, 8:19 AM

## 2021-03-13 NOTE — Anesthesia Procedure Notes (Signed)
Epidural Patient location during procedure: OB Start time: 03/13/2021 9:51 PM End time: 03/13/2021 10:02 PM  Staffing Anesthesiologist: Pervis Hocking, DO Performed: anesthesiologist   Preanesthetic Checklist Completed: patient identified, IV checked, risks and benefits discussed, monitors and equipment checked, pre-op evaluation and timeout performed  Epidural Patient position: sitting Prep: DuraPrep and site prepped and draped Patient monitoring: continuous pulse ox, blood pressure, heart rate and cardiac monitor Approach: midline Location: L3-L4 Injection technique: LOR air  Needle:  Needle type: Tuohy  Needle gauge: 17 G Needle length: 9 cm Needle insertion depth: 8 cm Catheter type: closed end flexible Catheter size: 19 Gauge Catheter at skin depth: 14 cm Test dose: negative  Assessment Sensory level: T8 Events: blood not aspirated, injection not painful, no injection resistance, no paresthesia and negative IV test  Additional Notes Patient identified. Risks/Benefits/Options discussed with patient including but not limited to bleeding, infection, nerve damage, paralysis, failed block, incomplete pain control, headache, blood pressure changes, nausea, vomiting, reactions to medication both or allergic, itching and postpartum back pain. Confirmed with bedside nurse the patient's most recent platelet count. Confirmed with patient that they are not currently taking any anticoagulation, have any bleeding history or any family history of bleeding disorders. Patient expressed understanding and wished to proceed. All questions were answered. Sterile technique was used throughout the entire procedure. Please see nursing notes for vital signs. Test dose was given through epidural catheter and negative prior to continuing to dose epidural or start infusion. Warning signs of high block given to the patient including shortness of breath, tingling/numbness in hands, complete motor  block, or any concerning symptoms with instructions to call for help. Patient was given instructions on fall risk and not to get out of bed. All questions and concerns addressed with instructions to call with any issues or inadequate analgesia.  Reason for block:procedure for pain

## 2021-03-13 NOTE — Progress Notes (Signed)
GWENNA FUSTON is a 31 y.o. G2P0101 at [redacted]w[redacted]d  admitted for induction of labor due to Elective at term.  Subjective: Reports feeling ok. Feeling contractions some but bearable.  Objective: BP 128/81    Pulse 90    Temp 97.8 F (36.6 C) (Oral)    Resp 16    Ht 5\' 4"  (1.626 m)    Wt 132.7 kg    LMP 06/10/2020 (Approximate)    BMI 50.22 kg/m  No intake/output data recorded. No intake/output data recorded.  FHT:  FHR: 140 bpm, variability: moderate,  accelerations:  Present,  decelerations:  Absent UC:   regular, every 3-5 minutes SVE:   Dilation: 4 Effacement (%): 50 Station: -3  Labs: Lab Results  Component Value Date   WBC 14.2 (H) 03/13/2021   HGB 12.3 03/13/2021   HCT 36.4 03/13/2021   MCV 94.8 03/13/2021   PLT 223 03/13/2021    Assessment / Plan: G2P0101 at [redacted]w[redacted]d  admitted for induction of labor due to Elective at term.  Labor:  now s/p FB and cytotec x2. 4 cm dilated. Head still ballotable. Will hold off on AROM. Start pitocin. Fetal Wellbeing:  Category I I/D:   GBS pos>PCN   Renard Matter 03/13/2021, 5:29 PM

## 2021-03-14 ENCOUNTER — Telehealth: Payer: Medicaid Other | Admitting: Nurse Practitioner

## 2021-03-14 ENCOUNTER — Encounter (HOSPITAL_COMMUNITY): Payer: Self-pay | Admitting: Obstetrics and Gynecology

## 2021-03-14 DIAGNOSIS — Z3A39 39 weeks gestation of pregnancy: Secondary | ICD-10-CM

## 2021-03-14 DIAGNOSIS — O99814 Abnormal glucose complicating childbirth: Secondary | ICD-10-CM

## 2021-03-14 DIAGNOSIS — O99824 Streptococcus B carrier state complicating childbirth: Secondary | ICD-10-CM

## 2021-03-14 MED ORDER — WITCH HAZEL-GLYCERIN EX PADS: 1.0000 "application " | MEDICATED_PAD | CUTANEOUS | Status: DC | PRN

## 2021-03-14 MED ORDER — ONDANSETRON HCL 4 MG PO TABS: 4.0000 mg | ORAL_TABLET | ORAL | Status: DC | PRN

## 2021-03-14 MED ORDER — ACETAMINOPHEN 325 MG PO TABS
650.0000 mg | ORAL_TABLET | ORAL | Status: DC | PRN
  Administered 2021-03-15: 650 mg via ORAL
  Filled 2021-03-14: qty 2

## 2021-03-14 MED ORDER — ALBUTEROL SULFATE (2.5 MG/3ML) 0.083% IN NEBU: 3.0000 mL | INHALATION_SOLUTION | Freq: Four times a day (QID) | RESPIRATORY_TRACT | Status: DC | PRN

## 2021-03-14 MED ORDER — ONDANSETRON HCL 4 MG/2ML IJ SOLN: 4.0000 mg | INTRAMUSCULAR | Status: DC | PRN

## 2021-03-14 MED ORDER — MEDROXYPROGESTERONE ACETATE 150 MG/ML IM SUSP: 150.0000 mg | INTRAMUSCULAR | Status: DC | PRN

## 2021-03-14 MED ORDER — DIBUCAINE (PERIANAL) 1 % EX OINT: 1.0000 "application " | TOPICAL_OINTMENT | CUTANEOUS | Status: DC | PRN

## 2021-03-14 MED ORDER — BENZOCAINE-MENTHOL 20-0.5 % EX AERO: 1.0000 "application " | INHALATION_SPRAY | CUTANEOUS | Status: DC | PRN

## 2021-03-14 MED ORDER — SIMETHICONE 80 MG PO CHEW: 80.0000 mg | CHEWABLE_TABLET | ORAL | Status: DC | PRN

## 2021-03-14 MED ORDER — PRENATAL MULTIVITAMIN CH
1.0000 | ORAL_TABLET | Freq: Every day | ORAL | Status: DC
  Administered 2021-03-15 – 2021-03-16 (×2): 1 via ORAL
  Filled 2021-03-14 (×2): qty 1

## 2021-03-14 MED ORDER — DIPHENHYDRAMINE HCL 25 MG PO CAPS: 25.0000 mg | ORAL_CAPSULE | Freq: Four times a day (QID) | ORAL | Status: DC | PRN

## 2021-03-14 MED ORDER — MEASLES, MUMPS & RUBELLA VAC IJ SOLR: 0.5000 mL | Freq: Once | INTRAMUSCULAR | Status: DC

## 2021-03-14 MED ORDER — SENNOSIDES-DOCUSATE SODIUM 8.6-50 MG PO TABS
2.0000 | ORAL_TABLET | Freq: Every day | ORAL | Status: DC
  Administered 2021-03-15 – 2021-03-16 (×2): 2 via ORAL
  Filled 2021-03-14 (×2): qty 2

## 2021-03-14 MED ORDER — TETANUS-DIPHTH-ACELL PERTUSSIS 5-2.5-18.5 LF-MCG/0.5 IM SUSY: 0.5000 mL | PREFILLED_SYRINGE | Freq: Once | INTRAMUSCULAR | Status: DC

## 2021-03-14 MED ORDER — IBUPROFEN 600 MG PO TABS
600.0000 mg | ORAL_TABLET | Freq: Four times a day (QID) | ORAL | Status: DC
  Administered 2021-03-14 – 2021-03-16 (×7): 600 mg via ORAL
  Filled 2021-03-14 (×7): qty 1

## 2021-03-14 MED ORDER — FERROUS SULFATE 325 (65 FE) MG PO TABS
325.0000 mg | ORAL_TABLET | ORAL | Status: DC
  Administered 2021-03-15: 325 mg via ORAL
  Filled 2021-03-14: qty 1

## 2021-03-14 MED ORDER — COCONUT OIL OIL: 1.0000 "application " | TOPICAL_OIL | Status: DC | PRN

## 2021-03-14 NOTE — Lactation Note (Signed)
This note was copied from a baby's chart. Lactation Consultation Note Baby has a lot of congestion at this time. Not interested in BF at this time. Mom stated she didn't have much success with her first child and pumped for a little while and had low milk supply. Mom isn't sure at this time that she is going to BF. Mom stated she is probably going to do more formula that breast. LC told mom that it's totally her choice and if she would like to BF Lactation is here to help you. Just let us know your wishes. Told mom Lactation would come see her again on MBU. Mom stated OK.  Patient Name: Gina Snow Date: 03/14/2021   Age:29 hours  Maternal Data    Feeding    LATCH Score                    Lactation Tools Discussed/Used    Interventions    Discharge    Consult Status      Theodoro Kalata 03/14/2021, 8:08 PM

## 2021-03-14 NOTE — Progress Notes (Addendum)
LABOR PROGRESS NOTE  Gina Snow is a 31 y.o. G2P0101 at [redacted]w[redacted]d  admitted for eIOL  Subjective: Pt is doing well and feeling intermittent pressure.   Objective: BP 120/62    Pulse 100    Temp 98.9 F (37.2 C) (Oral)    Resp 17    Ht 5\' 4"  (1.626 m)    Wt 132.7 kg    LMP 06/10/2020 (Approximate)    SpO2 99%    BMI 50.22 kg/m  or  Vitals:   03/14/21 1601 03/14/21 1612 03/14/21 1631 03/14/21 1709  BP: 129/75  120/62   Pulse: (!) 106  100   Resp:      Temp:  100.1 F (37.8 C)  98.9 F (37.2 C)  TempSrc:  Oral  Oral  SpO2:      Weight:      Height:         Dilation: 10 Dilation Complete Date: 03/14/21 Dilation Complete Time: 1704 Effacement (%): 100 Station: 0 Presentation: Vertex Exam by:: Renard Matter, MD FHT: baseline rate 150, moderate varibility, accels present, no decels Toco: ctx q 2-4 minutes  Labs: Lab Results  Component Value Date   WBC 14.2 (H) 03/13/2021   HGB 12.3 03/13/2021   HCT 36.4 03/13/2021   MCV 94.8 03/13/2021   PLT 223 03/13/2021    Patient Active Problem List   Diagnosis Date Noted   Supervision of other normal pregnancy, antepartum 03/13/2021   Positive GBS test 02/19/2021   Marijuana use 09/09/2020   Encounter for supervision of normal pregnancy, antepartum 09/02/2020   History of preterm delivery 04/16/2018   History of abnormal cervical Pap smear 01/03/2018   PCOS (polycystic ovarian syndrome) 12/21/2012   Prediabetes 10/26/2012   Tobacco use 09/17/2012    Assessment / Plan: 31 y.o. G2P0101 at [redacted]w[redacted]d here for eIOL  Labor: Pt is fully dilated but still at station 0. She has been placed in fowlers position. Will recheck in one hour or sooner if she feels constant pressure Fetal Wellbeing:  cat 1 Pain Control:  epidural Anticipated MOD:  SVD  Precious Gilding PGY1, family medicine resident  03/14/2021, 5:09 PM

## 2021-03-14 NOTE — Progress Notes (Signed)
Gina Snow is a 31 y.o. G2P0101 at [redacted]w[redacted]d  admitted for induction of labor due to Elective at term.  Subjective: Patient is comfortable with epidural. No concerns at this time.   Objective: BP (!) 101/50 (BP Location: Right Arm) Comment: asymptomatic   Pulse 79    Temp 98.7 F (37.1 C) (Oral)    Resp 17    Ht 5\' 4"  (1.626 m)    Wt 132.7 kg    LMP 06/10/2020 (Approximate)    SpO2 99%    BMI 50.22 kg/m  No intake/output data recorded. No intake/output data recorded.  FHT:  FHR: 140 bpm, moderate variability, +15x15 accels, occasional early/variable decel  UC: Q 2-3 mins SVE:   Dilation: 6.5 Effacement (%): 70 Station: -2 Exam by:: Maryagnes Amos, RN  Labs: Lab Results  Component Value Date   WBC 14.2 (H) 03/13/2021   HGB 12.3 03/13/2021   HCT 36.4 03/13/2021   MCV 94.8 03/13/2021   PLT 223 03/13/2021    Assessment / Plan: G2P0101 at [redacted]w[redacted]d  admitted for induction of labor due to Elective at term for suspected macrosomia.  Labor: Progressing. R/B/A of AROM discussed with patient. Patient gives verbal consent for AROM. Patient and fetus tolerated procedure well. Continue pitocin titration prn Fetal Wellbeing:  Category I I/D:   GBS pos>PCN    Renee Harder, CNM 03/14/2021, 12:41 AM

## 2021-03-14 NOTE — Progress Notes (Signed)
Gina Snow is a 31 y.o. G2P0101 at [redacted]w[redacted]d  admitted for induction of labor due to Elective at term.  Subjective: Patient is comfortable with epidural. No concerns at this time.   Objective: BP (!) 121/58    Pulse 94    Temp 98.7 F (37.1 C) (Oral)    Resp 17    Ht 5\' 4"  (1.626 m)    Wt 132.7 kg    LMP 06/10/2020 (Approximate)    SpO2 99%    BMI 50.22 kg/m  No intake/output data recorded. Total I/O In: -  Out: 650 [Urine:650]  FHT:  FHR: 140 bpm, moderate variability, +15x15 accels, occasional early decel  UC: Q 2-3 mins SVE:   Dilation: 6.5 Effacement (%): 70 Station: -2 Exam by:: Maryagnes Amos, CNM  Labs: Lab Results  Component Value Date   WBC 14.2 (H) 03/13/2021   HGB 12.3 03/13/2021   HCT 36.4 03/13/2021   MCV 94.8 03/13/2021   PLT 223 03/13/2021    Assessment / Plan: G2P0101 at [redacted]w[redacted]d  admitted for induction of labor due to Elective at term for suspected macrosomia.  Labor: Continue Pitocin titration prn.  Fetal Wellbeing:  Category I I/D:   GBS pos>PCN    Renee Harder, CNM 03/14/2021, 3:11 AM

## 2021-03-14 NOTE — Progress Notes (Signed)
LABOR PROGRESS NOTE  Gina Snow is a 32 y.o. G2P0101 at [redacted]w[redacted]d  admitted for eIOL  Subjective: Pt states she is tired, hungry, and frustrated but understands induction can take some time.   Objective: BP 122/61    Pulse 100    Temp 99 F (37.2 C) (Oral)    Resp 17    Ht 5\' 4"  (1.626 m)    Wt 132.7 kg    LMP 06/10/2020 (Approximate)    SpO2 99%    BMI 50.22 kg/m  or  Vitals:   03/14/21 1003 03/14/21 1034 03/14/21 1100 03/14/21 1129  BP:  (!) 102/49 (!) 105/58 122/61  Pulse:  87 97 100  Resp:      Temp: 99 F (37.2 C)     TempSrc: Oral     SpO2:      Weight:      Height:        Dilation: 7 Effacement (%): 80 Station: -1 Presentation: Vertex Exam by:: Jaymes Graff RN FHT: baseline rate 140, moderate varibility, accels present, no decels Toco: ctx q 2-3 minutes  Labs: Lab Results  Component Value Date   WBC 14.2 (H) 03/13/2021   HGB 12.3 03/13/2021   HCT 36.4 03/13/2021   MCV 94.8 03/13/2021   PLT 223 03/13/2021    Patient Active Problem List   Diagnosis Date Noted   Supervision of other normal pregnancy, antepartum 03/13/2021   Positive GBS test 02/19/2021   Marijuana use 09/09/2020   Encounter for supervision of normal pregnancy, antepartum 09/02/2020   History of preterm delivery 04/16/2018   History of abnormal cervical Pap smear 01/03/2018   PCOS (polycystic ovarian syndrome) 12/21/2012   Prediabetes 10/26/2012   Tobacco use 09/17/2012    Assessment / Plan: 31 y.o. G2P0101 at [redacted]w[redacted]d here for eIOL  Labor: Not much cervical change since about 0300. Dilated from 6.5 to 7 (checked by different people) with swollen cervix at last check. Benadryl given. Will continue with pitocin and check again in 3-4 hours. Plan to up titrate pitocin if contractions space out. Potential asynclitic fetal head, pt to change position every 30 minutes to hour.  Fetal Wellbeing:  cat 1 Pain Control:  epidural Anticipated MOD:  SVD  Precious Gilding PGY1, family medicine  resident  03/14/2021, 11:58 AM

## 2021-03-14 NOTE — Progress Notes (Signed)
LABOR PROGRESS NOTE  Gina Snow is a 31 y.o. G2P0101 at [redacted]w[redacted]d  admitted for eIOL  Subjective: Pt is currently sleeping well.   Objective: BP (!) 104/54    Pulse 96    Temp 99 F (37.2 C) (Oral)    Resp 17    Ht 5\' 4"  (1.626 m)    Wt 132.7 kg    LMP 06/10/2020 (Approximate)    SpO2 99%    BMI 50.22 kg/m  or  Vitals:   03/14/21 1301 03/14/21 1331 03/14/21 1408 03/14/21 1501  BP: 115/62 116/66 (!) 95/49 (!) 104/54  Pulse: 97 (!) 109 89 96  Resp:      Temp:      TempSrc:      SpO2:      Weight:      Height:         Dilation: 9 Effacement (%): 90 Station: 0 Presentation: Vertex Exam by:: Jaymes Graff RN FHT: baseline rate 155, moderate varibility, accels present, no decels Toco: ctx q 2-4 minutes  Labs: Lab Results  Component Value Date   WBC 14.2 (H) 03/13/2021   HGB 12.3 03/13/2021   HCT 36.4 03/13/2021   MCV 94.8 03/13/2021   PLT 223 03/13/2021    Patient Active Problem List   Diagnosis Date Noted   Supervision of other normal pregnancy, antepartum 03/13/2021   Positive GBS test 02/19/2021   Marijuana use 09/09/2020   Encounter for supervision of normal pregnancy, antepartum 09/02/2020   History of preterm delivery 04/16/2018   History of abnormal cervical Pap smear 01/03/2018   PCOS (polycystic ovarian syndrome) 12/21/2012   Prediabetes 10/26/2012   Tobacco use 09/17/2012    Assessment / Plan: 31 y.o. G2P0101 at [redacted]w[redacted]d here for eIOL  Labor: Pt has progressed well on pitocin. Will do cervical check 3-4 hours from last, around 1600-1700. Fetal Wellbeing:  cat 1 Pain Control:  epidural Anticipated MOD:  SVD  Precious Gilding PGY1, family medicine resident  03/14/2021, 3:44 PM

## 2021-03-14 NOTE — Discharge Summary (Signed)
Postpartum Discharge Summary     Patient Name: Gina Snow DOB: 22-Dec-1990 MRN: [id number]  Date of admission: 03/13/2021 Delivery date:03/14/2021  Delivering provider: Renard Matter  Date of discharge: 03/16/2021  Admitting diagnosis: Supervision of other normal pregnancy, antepartum [Z34.80] Intrauterine pregnancy: [redacted]w[redacted]d    Secondary diagnosis:  Principal Problem:   Supervision of other normal pregnancy, antepartum Active Problems:   Prediabetes   History of abnormal cervical Pap smear   Encounter for supervision of normal pregnancy, antepartum   Positive GBS test   Vaginal delivery  Additional problems: None    Discharge diagnosis: Term Pregnancy Delivered and GDM A1                                              Post partum procedures: Nexplanon insertion Augmentation: AROM, Pitocin, Cytotec, and IP Foley Complications: None  Hospital course: Induction of Labor With Vaginal Delivery   31y.o. yo G2P0101 at 383w4das admitted to the hospital 03/13/2021 for induction of labor.  Indication for induction: elective in setting of suspected macrosomia (>96%ile)   Patient had an uncomplicated labor course as follows: Membrane Rupture Time/Date: 12:38 AM ,03/14/2021   Delivery Method:Vaginal, Spontaneous  Episiotomy: None  Lacerations:  Periurethral  Details of delivery can be found in separate delivery note.  Patient had a routine postpartum course. Patient is discharged home 03/16/21.  Newborn Data: Birth date:03/14/2021  Birth time:7:23 PM  Gender:Female  Living status:Living  Apgars:8 ,9  Weight:4150 g   Magnesium Sulfate received: No BMZ received: No Rhophylac:N/A MMR:N/A T-DaP:Given prenatally Flu: Given prenatally Transfusion:No  Physical exam  Vitals:   03/15/21 0838 03/15/21 1215 03/15/21 2118 03/16/21 0448  BP: 131/72 121/66 (!) 106/57 120/65  Pulse: 66 79 83 76  Resp: '18 18 20 18  ' Temp: 98.2 F (36.8 C) 98.3 F (36.8 C) 97.9 F (36.6 C) 98.2 F  (36.8 C)  TempSrc: Oral Oral Oral Oral  SpO2:      Weight:      Height:       General: alert, cooperative, and no distress Lochia: appropriate Uterine Fundus: firm Incision: N/A DVT Evaluation: No evidence of DVT seen on physical exam. Negative Homan's sign. No cords or calf tenderness. Calf/Ankle edema is present (LT ankle hanging off the side of the bed) Labs: Lab Results  Component Value Date   WBC 14.2 (H) 03/13/2021   HGB 12.3 03/13/2021   HCT 36.4 03/13/2021   MCV 94.8 03/13/2021   PLT 223 03/13/2021   CMP Latest Ref Rng & Units 03/08/2021  Glucose 70 - 99 mg/dL 138(H)  BUN 6 - 20 mg/dL 6  Creatinine 0.44 - 1.00 mg/dL 0.72  Sodium 135 - 145 mmol/L 137  Potassium 3.5 - 5.1 mmol/L 3.7  Chloride 98 - 111 mmol/L 105  CO2 22 - 32 mmol/L 24  Calcium 8.9 - 10.3 mg/dL 8.8(L)  Total Protein 6.5 - 8.1 g/dL 5.7(L)  Total Bilirubin 0.3 - 1.2 mg/dL 0.4  Alkaline Phos 38 - 126 U/L 117  AST 15 - 41 U/L 14(L)  ALT 0 - 44 U/L 13   Edinburgh Score: Edinburgh Postnatal Depression Scale Screening Tool 03/15/2021  I have been able to laugh and see the funny side of things. 0  I have looked forward with enjoyment to things. 0  I have blamed myself unnecessarily when things  went wrong. 1  I have been anxious or worried for no good reason. 1  I have felt scared or panicky for no good reason. 0  Things have been getting on top of me. 0  I have been so unhappy that I have had difficulty sleeping. 0  I have felt sad or miserable. 0  I have been so unhappy that I have been crying. 1  The thought of harming myself has occurred to me. 0  Edinburgh Postnatal Depression Scale Total 3     After visit meds:  Allergies as of 03/16/2021   No Known Allergies      Medication List     STOP taking these medications    aspirin 81 MG chewable tablet       TAKE these medications    acetaminophen 500 MG tablet Commonly known as: TYLENOL Take 500 mg by mouth every 6 (six) hours as  needed for mild pain.   albuterol 108 (90 Base) MCG/ACT inhaler Commonly known as: VENTOLIN HFA Inhale 1-2 puffs into the lungs every 6 (six) hours as needed for wheezing or shortness of breath.   CitraNatal Assure 35-1 & 300 MG tablet One tablet and one capsule daily   ferrous sulfate 325 (65 FE) MG tablet Take 1 tablet (325 mg total) by mouth every other day. Start taking on: March 17, 2021   ibuprofen 600 MG tablet Commonly known as: ADVIL Take 1 tablet (600 mg total) by mouth every 6 (six) hours.   pramoxine-hydrocortisone 1-1 % rectal cream Commonly known as: PROCTOCREAM-HC Place 1 application rectally 2 (two) times daily. What changed:  when to take this reasons to take this         Discharge home in stable condition Infant Feeding: Bottle feeding Infant Disposition:home with mother Discharge instruction: per After Visit Summary and Postpartum booklet. Activity: Advance as tolerated. Pelvic rest for 6 weeks.  Diet: routine diet Future Appointments: Future Appointments  Date Time Provider [place]  04/26/2021 11:10 AM Roma Schanz, CNM [facility]   Follow up Visit:  Follow-up Information     [facility]. Go on 04/26/2021.   Specialty: Obstetrics and Gynecology Why: postpartum visit Contact information: [place] [phone]               Message sent to FT by Dr. Cy Blamer on 03/14/2021  Please schedule this patient for a In person postpartum visit in 4 weeks with the following provider: Any provider. Additional Postpartum F/U: None   Low risk pregnancy complicated by:  None Delivery mode:  Vaginal, Spontaneous  Anticipated Birth Control:  Nexplanon   03/16/2021 Laury Deep, CNM

## 2021-03-15 DIAGNOSIS — Z30017 Encounter for initial prescription of implantable subdermal contraceptive: Secondary | ICD-10-CM

## 2021-03-15 LAB — GLUCOSE, CAPILLARY: Glucose-Capillary: 120 mg/dL — ABNORMAL HIGH (ref 70–99)

## 2021-03-15 MED ORDER — NIFEDIPINE ER OSMOTIC RELEASE 30 MG PO TB24: 30.0000 mg | ORAL_TABLET | Freq: Every day | ORAL | Status: DC

## 2021-03-15 MED ORDER — LIDOCAINE HCL 1 % IJ SOLN
0.0000 mL | Freq: Once | INTRAMUSCULAR | Status: AC | PRN
  Administered 2021-03-15: 20 mL via INTRADERMAL
  Filled 2021-03-15: qty 20

## 2021-03-15 MED ORDER — ETONOGESTREL 68 MG ~~LOC~~ IMPL
68.0000 mg | DRUG_IMPLANT | Freq: Once | SUBCUTANEOUS | Status: AC
  Administered 2021-03-15: 68 mg via SUBCUTANEOUS
  Filled 2021-03-15: qty 1

## 2021-03-15 NOTE — Progress Notes (Signed)
Patient's blood glucose reading of 120 was not representative of a true fasting blood sugar. I took her glucose around 0500 but she had eaten a full meal at 0100 on 03/15/21.

## 2021-03-15 NOTE — Procedures (Addendum)
Post-Placental Nexplanon Insertion Procedure Note  Patient was identified. Informed consent was signed, signed copy in chart. A time-out was performed.  Discussed R/B of Nexplanon. Pt verbalized understanding of irregular bleeding for 6-9 months and the need to use BUM x 7 days, although, I recommend pelvic rest until PP appt.    The insertion site was identified 8-10 cm (3-4 inches) from the medial epicondyle of the humerus and 3-5 cm (1.25-2 inches) posterior to (below) the sulcus (groove) between the biceps and triceps muscles of the patient's left arm and marked. The site was prepped and draped in the usual sterile fashion. Pt was prepped with alcohol swab and then injected with 2 cc of 1% lidocaine. The site was prepped with betadine. Nexplanon removed form packaging,  Device confirmed in needle, then inserted full length of needle and withdrawn per handbook instructions. Provider and patient verified presence of the implant in the womans arm by palpation. Pt insertion site was covered with steristrips/adhesive bandage and pressure bandage. There was minimal blood loss. Patient tolerated procedure well.  Patient was given post procedure instructions and Nexplanon user card with expiration date. Condoms were recommended for STI prevention. Patient was asked to keep the pressure dressing on for 24 hours to minimize bruising and keep the adhesive bandage on for 3-5 days. The patient verbalized understanding of the plan of care and agrees.   Lot # See MAR Expiration Date See [facility]  Olga Coaster  Midwife attestation: I was gloved and present for the procedure in its entirety and I agree with the above student's note.  Julianne Handler, CNM 3:15 PM

## 2021-03-15 NOTE — Progress Notes (Signed)
Patient ID: Gina Snow, female   DOB: [birth year], 31 y.o.   MRN: [id number]  POSTPARTUM PROGRESS NOTE  Post Partum Day 1  Subjective:  Gina Snow is a 31 y.o. P8E4235 s/p SVD at 107w4d.  No acute events overnight.  Pt denies problems with ambulating, voiding or po intake.  She denies nausea or vomiting.  Pain is well controlled.  She has had flatus. She has not had bowel movement.  Lochia Small.   Objective: Blood pressure 119/68, pulse 94, temperature 98.6 F (37 C), temperature source Oral, resp. rate 18, height 5\' 4"  (1.626 m), weight 132.7 kg, last menstrual period 06/10/2020, SpO2 100 %, unknown if currently breastfeeding. One high BP last night at 143/78  Physical Exam:  General: alert, cooperative and no distress Chest: no respiratory distress Heart:regular rate, distal pulses intact Abdomen: soft, nontender DVT Evaluation: No calf swelling or tenderness Extremities: Trace pitting edema Skin: warm, dry  Recent Labs    03/13/21 0806  HGB 12.3  HCT 36.4  Glucose 120 on 1/30 at 0453  Assessment/Plan: Gina Snow is a 31 y.o. [id number] s/p SVD at [redacted]w[redacted]d   PPD#1 - Doing well. Continue to monitor BP and glucose Contraception: Nexplanon Feeding: Breast milk and formula Dispo: Plan for discharge 1/31   LOS: 2 days   Reggy Eye, Medical Student 03/15/2021, 7:45 AM

## 2021-03-15 NOTE — Lactation Note (Signed)
This note was copied from a baby's chart. Lactation Consultation Note Mom feeding choice is breast/formula feeding. Mom states she has given formula because he hasn't latched well to the breast. Mom states she is going to try to latch some but mainly pumping and formula feeding. Mom encouraged to feed baby 8-12 times/24 hours and with feeding cues.  Mom shown how to use DEBP & how to disassemble, clean, & reassemble parts. Mom knows to pump q3h for 15-20 min.  Mom was pumping when [place] left room. Not getting anything explained expected at this time. Mom calm and pleasant. Lactation brochure given and [place] OP services mentioned. Encouraged to call for latch assistance.  Patient Name: Gina Snow OVPCH'E Date: 03/15/2021 Reason for consult: Initial assessment;Maternal endocrine disorder;Term Age:51 hours  Maternal Data Has patient been taught Hand Expression?: Yes Does the patient have breastfeeding experience prior to this delivery?: Yes How long did the patient breastfeed?: pumped 2-3 months  Feeding    LATCH Score       Type of Nipple: Everted at rest and after stimulation  Comfort (Breast/Nipple): Soft / non-tender         Lactation Tools Discussed/Used Tools: Pump Breast pump type: Double-Electric Breast Pump Pump Education: Setup, frequency, and cleaning;Milk Storage Reason for Pumping: mom wants to pump. Pumping frequency: Q 3hrs Pumped volume: 0 mL  Interventions    Discharge    Consult Status Consult Status: Follow-up Date: 03/15/21 Follow-up type: In-patient    Gina Snow, [place]ta [place] 03/15/2021, 2:35 AM

## 2021-03-15 NOTE — Anesthesia Postprocedure Evaluation (Signed)
Anesthesia Post Note  Patient: [name]  Procedure(s) Performed: AN AD HOC LABOR EPIDURAL     Patient location during evaluation: Mother Baby Anesthesia Type: Epidural Level of consciousness: awake and alert and oriented Pain management: satisfactory to patient Vital Signs Assessment: post-procedure vital signs reviewed and stable Respiratory status: respiratory function stable Cardiovascular status: stable Postop Assessment: no headache, no backache, epidural receding, patient able to bend at knees, no signs of nausea or vomiting, adequate PO intake and able to ambulate Anesthetic complications: no   No notable events documented.  Last Vitals:  Vitals:   03/14/21 2351 03/15/21 0447  BP: 125/65 119/68  Pulse: 91 94  Resp:  18  Temp: 36.7 C 37 C  SpO2: 97% 100%    Last Pain:  Vitals:   03/15/21 0924  TempSrc:   PainSc: 0-No pain   Pain Goal: Patients Stated Pain Goal: 0 (03/14/21 2132)                 Katherina Mires

## 2021-03-16 LAB — GLUCOSE, CAPILLARY: Glucose-Capillary: 107 mg/dL — ABNORMAL HIGH (ref 70–99)

## 2021-03-16 MED ORDER — FERROUS SULFATE 325 (65 FE) MG PO TABS: 325.0000 mg | ORAL_TABLET | ORAL | 3 refills | Status: DC

## 2021-03-16 MED ORDER — IBUPROFEN 600 MG PO TABS: 600.0000 mg | ORAL_TABLET | Freq: Four times a day (QID) | ORAL | 0 refills | Status: DC

## 2021-03-16 NOTE — Social Work (Signed)
CSW received consult for hx of marijuana use.  Referral was screened out due to the following:  ~MOB had no documented substance use after initial prenatal visit/+UPT. ~MOB had no positive drug screens after initial prenatal visit/+UPT. ~Baby's UDS is negative.  Please consult CSW if current concerns arise or by MOB's request.  CSW will monitor CDS results and make report to Child Protective Services if warranted.   Gina Snow, MSW, LCSW [facility] [place] Worker  [phone] 03/16/2021  8:23 AM

## 2021-03-16 NOTE — Lactation Note (Signed)
This note was copied from a baby's chart. Lactation Consultation Note  Patient Name: Girl Odie Rauen AQVOH'C Date: 03/16/2021 Reason for consult: Follow-up assessment;Term;Infant weight loss (3 % weight loss. per mom baby recently fed a bottle. LC reviewed doc flow sheets /WNL for age. Per mom "I'm probably only going to pump and bottle feed and  have a DEBP at home. LC reviewed BF D/C teaching.) Age:29 hours  Maternal Data    Feeding Mother's Current Feeding Choice: Breast Milk and Formula Nipple Type: Slow - flow  LATCH Score                    Lactation Tools Discussed/Used    Interventions    Discharge Discharge Education: Engorgement and breast care;Warning signs for feeding baby Pump: Personal;DEBP  Consult Status Consult Status: Complete Date: 03/16/21    [place] 03/16/2021, 10:00 AM

## 2021-03-25 ENCOUNTER — Telehealth (HOSPITAL_COMMUNITY): Payer: Self-pay | Admitting: *Deleted

## 2021-03-25 NOTE — Telephone Encounter (Signed)
Patient voiced no questions or concerns at this time. EPDS=1. Patient voiced no questions or concerns regarding infant at this time. Patient reports infant sleeps in a bassinet on her back. RN reviewed ABCs of safe sleep. Patient verbalized understanding. Patient informed about hospital's virtual postpartum classes and support groups - declined email information at this time.  Erline Levine, RN, 03/25/21, [phone]

## 2021-04-26 ENCOUNTER — Other Ambulatory Visit: Payer: Self-pay

## 2021-04-26 ENCOUNTER — Encounter: Payer: Self-pay | Admitting: Women's Health

## 2021-04-26 ENCOUNTER — Ambulatory Visit (INDEPENDENT_AMBULATORY_CARE_PROVIDER_SITE_OTHER): Payer: Medicaid Other | Admitting: Women's Health

## 2021-04-26 DIAGNOSIS — Z8759 Personal history of other complications of pregnancy, childbirth and the puerperium: Secondary | ICD-10-CM | POA: Insufficient documentation

## 2021-04-26 DIAGNOSIS — Z975 Presence of (intrauterine) contraceptive device: Secondary | ICD-10-CM | POA: Insufficient documentation

## 2021-04-26 NOTE — Progress Notes (Signed)
? ?POSTPARTUM VISIT ?Patient name: Gina Snow MRN [id number]  Date of birth: 1990/11/06 ?Chief Complaint:   ?Postpartum Care ? ?History of Present Illness:   ?Gina Snow is a 31 y.o. G98P1102 African American female being seen today for a postpartum visit. She is 6 weeks postpartum following a spontaneous vaginal delivery at 39.4 gestational weeks. IOL: yes, for elective for suspected fetal macrosomia. Anesthesia: epidural.  Laceration: periurethral.  Complications: shoulder dystocia resolved by [place], suprapubic pressure, delivery of posterior arm. Inpatient contraception: yes Nexplanon .   ?Pregnancy uncomplicated. ?Tobacco use: yes. Substance use disorder: no. ?Last pap smear: 09/02/20 and results were NILM w/ HRHPV negative. Next pap smear due: 2025 ?Patient's last menstrual period was 06/10/2020 (approximate). ? ?Postpartum course has been uncomplicated. Bleeding none. Bowel function is normal. Bladder function is normal. Urinary incontinence? no, fecal incontinence? no ?Patient is not sexually active. Last sexual activity: prior to birth of baby. Desired contraception: PP Nexplanon placed. Patient does not know want a pregnancy in the future.  Desired family size is uncertain #of children.  ? [place] - 04/26/21 1124   ? ?  ? Pregnancy Intention Screening  ? Does the patient want to become pregnant in the next year? Unsure   ? Does the patient's partner want to become pregnant in the next year? Unsure   ? Would the patient like to discuss contraceptive options today? No   ?  ? Contraception Wrap Up  ? Current Method Hormonal Implant   ? End Method Hormonal Implant   ? Contraception Counseling Provided No   ? ?  ?  ? ?  ? ?The pregnancy intention screening data noted above was reviewed. Potential methods of contraception were discussed. The patient elected to proceed with Hormonal Implant. ? ?Edinburgh Postpartum Depression Screening: negative ? Edinburgh Postnatal Depression Scale - 04/26/21  1124   ? ?  ? Edinburgh Postnatal Depression Scale:  In the Past 7 Days  ? I have been able to laugh and see the funny side of things. 0   ? I have looked forward with enjoyment to things. 0   ? I have blamed myself unnecessarily when things went wrong. 0   ? I have been anxious or worried for no good reason. 0   ? I have felt scared or panicky for no good reason. 0   ? Things have been getting on top of me. 0   ? I have been so unhappy that I have had difficulty sleeping. 0   ? I have felt sad or miserable. 0   ? I have been so unhappy that I have been crying. 0   ? The thought of harming myself has occurred to me. 0   ? Edinburgh Postnatal Depression Scale Total 0   ? ?  ?  ? ?  ?  ?GAD 7 : Generalized Anxiety Score 12/17/2020 09/02/2020  ?Nervous, Anxious, on Edge 0 0  ?Control/stop worrying 0 1  ?Worry too much - different things 0 0  ?Trouble relaxing 1 1  ?Restless 0 0  ?Easily annoyed or irritable 1 0  ?Afraid - awful might happen 0 0  ?Total GAD 7 Score 2 2  ? ? ? ?Baby's course has been uncomplicated. Baby is feeding by bottle. Infant has a pediatrician/family doctor? Yes.  Childcare strategy if returning to work/school:  coming to work w/ her .  Pt has material needs met for her and baby: Yes.   ?Review of Systems:   ?  Pertinent items are noted in HPI ?Denies Abnormal vaginal discharge w/ itching/odor/irritation, headaches, visual changes, shortness of breath, chest pain, abdominal pain, severe nausea/vomiting, or problems with urination or bowel movements. ?Pertinent History Reviewed:  ?Reviewed past medical,surgical, obstetrical and family history.  ?Reviewed problem list, medications and allergies. ?OB History  ?Gravida Para Term Preterm AB Living  ?'2 2 1 1 ' 0 2  ?SAB IAB Ectopic Multiple Live Births  ?0 0 0 0 2  ?  ?# Outcome Date GA Lbr Len/2nd Weight Sex Delivery Anes PTL Lv  ?2 Term 03/14/21 [redacted]w[redacted]d/ 02:19 9 lb 2.4 oz (4.15 kg) F Vag-Spont EPI  LIV  ?1 Preterm 03/14/18 367w1d4 lb 8 oz (2.041 kg) F  Vag-Spont EPI N LIV  ?   Complications: Preterm premature rupture of membranes  ? ?Physical Assessment:  ? ?Vitals:  ? 04/26/21 1126  ?BP: 116/78  ?Pulse: 66  ?Weight: 275 lb 6.4 oz (124.9 kg)  ?Height: '5\' 4"'  (1.626 m)  ?Body mass index is 47.27 kg/m?. ? ?     Physical Examination:  ? General appearance: alert, well appearing, and in no distress ? Mental status: alert, oriented to person, place, and time ? Skin: warm & dry  ? Cardiovascular: normal heart rate noted  ? Respiratory: normal respiratory effort, no distress  ? Breasts: deferred, no complaints  ? Abdomen: soft, non-tender  ? Pelvic: lac well healed. Thin prep pap obtained: No ? Rectal: not examined ? Extremities: Edema: none  ? ?Chaperone: AnGlenard Haringeas   ?      ?No results found for this or any previous visit (from the past 24 hour(s)).  ?Assessment & Plan:  ?1) Postpartum exam ?2) 6 wks s/p spontaneous vaginal delivery after elective IOL for suspected fetal macrosomia ?3) bottle feeding ?4) Depression screening ?5) Contraception s/p Nexplanon in hospital ?6) Shoulder dystocia ? ?Essential components of care per ACOG recommendations: ? ?1.  Mood and well being:  ?If positive depression screen, discussed and plan developed.  ?If using tobacco we discussed reduction/cessation and risk of relapse ?If current substance abuse, we discussed and referral to local resources was offered.  ? ?2. Infant care and feeding:  ?If breastfeeding, discussed returning to work, pumping, breastfeeding-associated pain, guidance regarding return to fertility while lactating if not using another method. If needed, patient was provided with a letter to be allowed to pump q 2-3hrs to support lactation in a private location with access to a refrigerator to store breastmilk.   ?Recommended that all caregivers be immunized for flu, pertussis and other preventable communicable diseases ?If pt does not have material needs met for her/baby, referred to local resources for help obtaining  these. ? ?3. Sexuality, contraception and birth spacing ?Provided guidance regarding sexuality, management of dyspareunia, and resumption of intercourse ?Discussed avoiding interpregnancy interval <32m43m and recommended birth spacing of 18 months ? ?4. Sleep and fatigue ?Discussed coping options for fatigue and sleep disruption ?Encouraged family/partner/community support of 4 hrs of uninterrupted sleep to help with mood and fatigue ? ?5. Physical recovery  ?If pt had a C/S, assessed incisional pain and providing guidance on normal vs prolonged recovery ?If pt had a laceration, perineal healing and pain reviewed.  ?If urinary or fecal incontinence, discussed management and referred to PT or uro/gyn if indicated  ?Patient is safe to resume physical activity. Discussed attainment of healthy weight. ? ?6.  Chronic disease management ?Discussed pregnancy complications if any, and their implications for future childbearing and long-term maternal  health. ?Review recommendations for prevention of recurrent pregnancy complications, such as 17 hydroxyprogesterone caproate to reduce risk for recurrent PTB not applicable, or aspirin to reduce risk of preeclampsia not applicable. ?Pt had GDM: no. If yes, 2hr GTT scheduled: not applicable. ?Reviewed medications and non-pregnant dosing including consideration of whether pt is breastfeeding using a reliable resource such as LactMed: not applicable ?Referred for f/u w/ PCP or subspecialist providers as indicated: not applicable ? ?7. Health maintenance ?Mammogram at 29yo or earlier if indicated ?Pap smears as indicated ? ?Meds: No orders of the defined types were placed in this encounter. ? ? ?Follow-up: Return in about 1 year (around 04/27/2022) for Physical.  ? ?No orders of the defined types were placed in this encounter. ? ? ?[place], WHNP-BC ?04/26/2021 ?11:59 AM ?  ?

## 2021-06-24 ENCOUNTER — Encounter: Payer: Self-pay | Admitting: Internal Medicine

## 2021-07-16 ENCOUNTER — Other Ambulatory Visit (HOSPITAL_COMMUNITY): Payer: Self-pay | Admitting: Nurse Practitioner

## 2021-07-16 ENCOUNTER — Ambulatory Visit (HOSPITAL_COMMUNITY)
Admission: RE | Admit: 2021-07-16 | Discharge: 2021-07-16 | Disposition: A | Discharge status: Home / Self Care | Payer: Medicaid Other | Source: Ambulatory Visit | Attending: Nurse Practitioner | Admitting: Nurse Practitioner

## 2021-07-16 DIAGNOSIS — M25561 Pain in right knee: Secondary | ICD-10-CM | POA: Insufficient documentation

## 2021-07-16 DIAGNOSIS — M25562 Pain in left knee: Secondary | ICD-10-CM | POA: Diagnosis present

## 2021-08-11 ENCOUNTER — Telehealth: Payer: Self-pay | Admitting: *Deleted

## 2021-08-11 ENCOUNTER — Ambulatory Visit (INDEPENDENT_AMBULATORY_CARE_PROVIDER_SITE_OTHER): Payer: Medicaid Other | Admitting: Gastroenterology

## 2021-08-11 ENCOUNTER — Encounter: Payer: Self-pay | Admitting: *Deleted

## 2021-08-11 ENCOUNTER — Encounter: Payer: Self-pay | Admitting: Gastroenterology

## 2021-08-11 DIAGNOSIS — K625 Hemorrhage of anus and rectum: Secondary | ICD-10-CM | POA: Diagnosis not present

## 2021-08-11 MED ORDER — CLENPIQ 10-3.5-12 MG-GM -GM/175ML PO SOLN: 1.0000 | ORAL | 0 refills | Status: DC

## 2021-08-11 NOTE — Telephone Encounter (Signed)
Called pt. She has been scheduled for TCS with Dr. Abbey Chatters ASA 3 on 7/31 at 12:45pm. Aware will mail prep instructions/pre-op appt. Rx for prep sent

## 2021-08-11 NOTE — Progress Notes (Signed)
Gastroenterology Office Note    Referring Provider: Denyce Robert, FNP Primary Care Physician:  Denyce Robert, FNP  Primary GI: Dr. Abbey Chatters    Chief Complaint   Chief Complaint  Patient presents with   Hemorrhoids    Patient here today with complaints of hemorrhoids that she states she got at last pregnancy. She takes two stool softeners daily. She does see some bright red blood when wiping at times. She says she is not having any issues with constipation currently.     History of Present Illness   VIOLETTA Snow is a 31 y.o. female presenting today at the request of Denyce Robert, FNP due to rectal bleeding and concern for hemorrhoids. No prior colonoscopy. No family history of colorectal cancer polyps.    Hemorrhoids flared during pregnancy (gave birth Jan 2023).Has to take stool softeners otherwise has pain with BM. If takes two stool softeners will not have pain. Straining if no stool softeners. If straining and hard still, will feel like "cutting". Has associated rectal bleeding. Stool softeners even with bleeding. No prolapsing tissue. Felt lumps externally when pregnant.   Past Medical History:  Diagnosis Date   Asthma    Obesity    PCO (polycystic ovaries)     Past Surgical History:  Procedure Laterality Date   NO PAST SURGERIES      Current Outpatient Medications  Medication Sig Dispense Refill   acetaminophen (TYLENOL) 500 MG tablet Take 500 mg by mouth every 6 (six) hours as needed for mild pain.     albuterol (PROVENTIL HFA;VENTOLIN HFA) 108 (90 Base) MCG/ACT inhaler Inhale 1-2 puffs into the lungs every 6 (six) hours as needed for wheezing or shortness of breath. 1 Inhaler 0   buPROPion (WELLBUTRIN XL) 150 MG 24 hr tablet Take 150 mg by mouth daily.     diclofenac (CATAFLAM) 50 MG tablet SMARTSIG:1 Tablet(s) By Mouth Every 12 Hours PRN     escitalopram (LEXAPRO) 10 MG tablet SMARTSIG:1 Tablet(s) By Mouth Every Evening     Etonogestrel (NEXPLANON  [place]) Inject into the skin.     ferrous sulfate 325 (65 FE) MG tablet Take 1 tablet (325 mg total) by mouth every other day. 60 tablet 3   gabapentin (NEURONTIN) 300 MG capsule Take 300 mg by mouth 2 (two) times daily.     hydrOXYzine (ATARAX) 50 MG tablet Take 50 mg by mouth every 6 (six) hours as needed.     naproxen (NAPROSYN) 250 MG tablet Take 250 mg by mouth every 6 (six) hours as needed.     No current facility-administered medications for this visit.    Allergies as of 08/11/2021   (No Known Allergies)    Family History  Problem Relation Age of Onset   Clotting disorder Mother 52       had "brain" clot?   Hypertension Maternal Grandmother    Diabetes Maternal Grandmother    Thyroid disease Maternal Grandmother    Hypertension Maternal Grandfather    Diabetes Paternal Grandmother     Social History   Socioeconomic History   Marital status: Single    Spouse name: Buzzy Han   Number of children: 1   Years of education: 12   Highest education level: High school graduate  Occupational History   Occupation: cna    Comment: [employer]  Tobacco Use   Smoking status: Every Day    Packs/day: 0.25    Years: 8.00    Total pack years: 2.00  Types: Cigarettes   Smokeless tobacco: Never   Tobacco comments:    4/5 per day  Vaping Use   Vaping Use: Never used  Substance and Sexual Activity   Alcohol use: Not Currently    Comment: every once in a while   Drug use: No   Sexual activity: Not Currently    Birth control/protection: Implant  Other Topics Concern   Not on file  Social History Narrative   ** Merged History Encounter **       Social Determinants of Health   Financial Resource Strain: Low Risk  (04/26/2021)   Overall Financial Resource Strain (CARDIA)    Difficulty of Paying Living Expenses: Not hard at all  Food Insecurity: No Food Insecurity (04/26/2021)   Hunger Vital Sign    Worried About Running Out of Food in the Last Year: Never true    Ran Out  of Food in the Last Year: Never true  Transportation Needs: No Transportation Needs (04/26/2021)   PRAPARE - Hydrologist (Medical): No    Lack of Transportation (Non-Medical): No  Physical Activity: Insufficiently Active (04/26/2021)   Exercise Vital Sign    Days of Exercise per Week: 3 days    Minutes of Exercise per Session: 30 min  Stress: No Stress Concern Present (04/26/2021)   [place]    Feeling of Stress : Not at all  Social Connections: Moderately Isolated (04/26/2021)   Social Connection and Isolation Panel [NHANES]    Frequency of Communication with Friends and Family: More than three times a week    Frequency of Social Gatherings with Friends and Family: More than three times a week    Attends Religious Services: 1 to 4 times per year    Active Member of [employer] or Organizations: No    Attends Archivist Meetings: Never    Marital Status: Never married  Intimate Partner Violence: Not At Risk (04/26/2021)   Humiliation, Afraid, Rape, and Kick questionnaire    Fear of Current or Ex-Partner: No    Emotionally Abused: No    Physically Abused: No    Sexually Abused: No     Review of Systems   Gen: Denies any fever, chills, fatigue, weight loss, lack of appetite.  CV: Denies chest pain, heart palpitations, peripheral edema, syncope.  Resp: Denies shortness of breath at rest or with exertion. Denies wheezing or cough.  GI: see HPI GU : Denies urinary burning, urinary frequency, urinary hesitancy MS: Denies joint pain, muscle weakness, cramps, or limitation of movement.  Derm: Denies rash, itching, dry skin Psych: Denies depression, anxiety, memory loss, and confusion Heme: Denies bruising, bleeding, and enlarged lymph nodes.   Physical Exam   BP 122/81 (BP Location: Right Arm, Patient Position: Sitting, Cuff Size: Large)   Pulse 92   Temp 98.6 F (37 C) (Oral)   Ht  '5\' 4"'$  (1.626 m)   Wt 295 lb 9.6 oz (134.1 kg)   BMI 50.74 kg/m  General:   Alert and oriented. Pleasant and cooperative. Well-nourished and well-developed.  Head:  Normocephalic and atraumatic. Eyes:  Without icterus Ears:  Normal auditory acuity. Lungs:  Clear to auscultation bilaterally.  Heart:  S1, S2 present without murmurs appreciated.  Abdomen:  +BS, soft, non-tender and non-distended. No HSM noted. No guarding or rebound. No masses appreciated.  Rectal:  external hemorrhoid tags noted. DRE without mass. Anoscopy performed without mass. Prominence of all columns.  Msk:  Symmetrical without gross deformities. Normal posture. Extremities:  Without edema. Neurologic:  Alert and  oriented x4;  grossly normal neurologically. Skin:  Intact without significant lesions or rashes. Psych:  Alert and cooperative. Normal mood and affect.   [place] BANDING PROCEDURE NOTE  The patient presents with symptomatic grade 2 hemorrhoids, unresponsive to maximal medical therapy, requesting rubber band ligation of his/her hemorrhoidal disease. All risks, benefits, and alternative forms of therapy were described and informed consent was obtained.  The decision was made to band the left lateral internal hemorrhoid, and the [place] was used to perform band ligation without complication. Digital anorectal examination was then performed to assure proper positioning of the band, and to adjust the banded tissue as required. The patient was discharged home without pain or other issues. Dietary and behavioral recommendations were given, along with follow-up instructions. The patient will return in several weeks for followup and possible additional banding as required.  No complications were encountered and the patient tolerated the procedure well.     Assessment   Gina Snow is a 31 y.o. female presenting today at the request of her PCP for hemorrhoid concerns.  Upon first discussing with  patient, I was concerned about possible underlying fissure. I was not able to appreciate this obviously on exam, and she had no discomfort whatsoever with DRE or anoscopy.  I discussed with her it would be prudent to pursue diagnostic colonoscopy, although I suspect this is a benign anorectal source. As DRE was unrevealing for any concerning findings other than hemorrhoids, I elected to go ahead and band left lateral hemorrhoid with the caveat that she would still be pursuing colonoscopy in the future.   PLAN   Proceed with colonoscopy by Dr. Abbey Chatters  in near future: the risks, benefits, and alternatives have been discussed with the patient in detail. The patient states understanding and desires to proceed.   Benefiber daily. Continue stool softeners  Follow-up for banding.   Annitta Needs, PhD, ANP-BC [facility] Gastroenterology

## 2021-08-11 NOTE — Patient Instructions (Signed)
We are arranging a colonoscopy in the near future with Dr. Abbey Chatters.  We will see you in follow-up for banding in the near future. You will need 2 more bandings after this first one.  Continue stool softeners as needed. Avoid straining, limit toilet time to 2-3 minutes.   You can add Benefiber daily to your regimen. This is helpful to have better bowel movements.  It was a pleasure to see you today. I want to create trusting relationships with patients to provide genuine, compassionate, and quality care. I value your feedback. If you receive a survey regarding your visit,  I greatly appreciate you taking time to fill this out.   Annitta Needs, PhD, ANP-BC [facility] Gastroenterology

## 2021-08-12 ENCOUNTER — Encounter: Payer: Self-pay | Admitting: Internal Medicine

## 2021-08-27 ENCOUNTER — Ambulatory Visit: Payer: Medicaid Other | Admitting: Orthopedic Surgery

## 2021-08-27 ENCOUNTER — Encounter: Payer: Self-pay | Admitting: Orthopedic Surgery

## 2021-08-27 VITALS — BP 142/92 | HR 84 | Ht 64.0 in | Wt 301.0 lb

## 2021-08-27 DIAGNOSIS — M170 Bilateral primary osteoarthritis of knee: Secondary | ICD-10-CM | POA: Diagnosis not present

## 2021-08-27 MED ORDER — METHYLPREDNISOLONE ACETATE 40 MG/ML IJ SUSP
40.0000 mg | Freq: Once | INTRAMUSCULAR | Status: AC
  Administered 2021-08-27: 40 mg via INTRA_ARTICULAR

## 2021-08-27 NOTE — Patient Instructions (Signed)

## 2021-08-27 NOTE — Progress Notes (Signed)
Chief Complaint  Patient presents with   New Patient (Initial Visit)   Knee Pain    Bilateral LT>RT//painful for several years. Currently taking Diclofenac, Gabapentin, and Tramadol for pain    HPI: 31 year old female presents with history of bilateral knee arthritis.  She is overweight her BMI is 51.  She has tried a couple of anti-inflammatories currently on Cataflam 50 twice daily.  She is looking for other treatments other than weight loss  No injury  Past Medical History:  Diagnosis Date   Asthma    Obesity    PCO (polycystic ovaries)     BP (!) 142/92   Pulse 84   Ht '5\' 4"'$  (1.626 m)   Wt (!) 301 lb (136.5 kg)   BMI 51.67 kg/m    General appearance: Well-developed well-nourished no gross deformities  Cardiovascular normal pulse and perfusion normal color without edema  Neurologically no sensation loss or deficits or pathologic reflexes  Psychological: Awake alert and oriented x3 mood and affect normal  Skin no lacerations or ulcerations no nodularity no palpable masses, no erythema or nodularity  Musculoskeletal: Ambulates without assistive device  She is heading towards varus bilateral knees.  She has tenderness over the medial joint lines mild crepitation in each knee no effusion she still maintains 125 degree range of motion bilaterally with stable knees  Imaging I read outside images and they show bilateral knee 3 compartment arthritis with osteophyte formation and slight bowing of the proximal tibia  A/P  Very difficult case.  31 year old female overweight bilateral knee arthritis advanced for age  I counseled her on the deleterious effects of cortisone injection and advised her to only have 1/year.  She will continue with weight loss measures anti-inflammatories follow-up in a year repeat x-rays  The patient meets the AMA guidelines for Morbid (severe) obesity with a BMI > 40.0 and I have recommended weight loss.  Procedure note for bilateral knee  injections  Procedure note left knee injection verbal consent was obtained to inject left knee joint  Timeout was completed to confirm the site of injection  The medications used were 40 mg depomedrol and 3 cc of 1% lidocaine  Anesthesia was provided by ethyl chloride and the skin was prepped with alcohol.  After cleaning the skin with alcohol a 20-gauge needle was used to inject the left knee joint. There were no complications. A sterile bandage was applied.   Procedure note right knee injection verbal consent was obtained to inject right knee joint  Timeout was completed to confirm the site of injection  The medications used were 40 mg depomedrol and 3 cc of 1% lidocaine  Anesthesia was provided by ethyl chloride and the skin was prepped with alcohol.  After cleaning the skin with alcohol a 20-gauge needle was used to inject the right knee joint. There were no complications. A sterile bandage was applied.

## 2021-08-27 NOTE — Addendum Note (Signed)
Addended byCandice Camp on: 08/27/2021 10:41 AM   Modules accepted: Orders

## 2021-09-07 NOTE — Patient Instructions (Signed)
Gina Snow  09/07/2021     '@PREFPERIOPPHARMACY'$ @   Your procedure is scheduled on  09/13/2021.   Report to [facility] at  [place]  A.M.   Call this number if you have problems the morning of surgery:  [phone]   Remember:       Follow the diet and prep instructions given to you by the office.     Take these medicines the morning of surgery with A SIP OF WATER         Cataflam or ultram (if needed), lexapro, neurontin, atarax.     Do not wear jewelry, make-up or nail polish.  Do not wear lotions, powders, or perfumes, or deodorant.  Do not shave 48 hours prior to surgery.  Men may shave face and neck.  Do not bring valuables to the hospital.  [facility] is not responsible for any belongings or valuables.  Contacts, dentures or bridgework may not be worn into surgery.  Leave your suitcase in the car.  After surgery it may be brought to your room.  For patients admitted to the hospital, discharge time will be determined by your treatment team.  Patients discharged the day of surgery will not be allowed to drive home and must have someone with them for 24 hours.    Special instructions:   DO NOT smoke tobacco or vape for 24 hours before your procedure.  Please read over the following fact sheets that you were given. Anesthesia Post-op Instructions and Care and Recovery After Surgery      Colonoscopy, Adult, Care After The following information offers guidance on how to care for yourself after your procedure. Your health care provider may also give you more specific instructions. If you have problems or questions, contact your health care provider. What can I expect after the procedure? After the procedure, it is common to have: A small amount of blood in your stool for 24 hours after the procedure. Some gas. Mild cramping or bloating of your abdomen. Follow these instructions at home: Eating and drinking  Drink enough fluid to keep your urine pale  yellow. Follow instructions from your health care provider about eating or drinking restrictions. Resume your normal diet as told by your health care provider. Avoid heavy or fried foods that are hard to digest. Activity Rest as told by your health care provider. Avoid sitting for a long time without moving. Get up to take short walks every 1-2 hours. This is important to improve blood flow and breathing. Ask for help if you feel weak or unsteady. Return to your normal activities as told by your health care provider. Ask your health care provider what activities are safe for you. Managing cramping and bloating  Try walking around when you have cramps or feel bloated. If directed, apply heat to your abdomen as told by your health care provider. Use the heat source that your health care provider recommends, such as a moist heat pack or a heating pad. Place a towel between your skin and the heat source. Leave the heat on for 20-30 minutes. Remove the heat if your skin turns bright red. This is especially important if you are unable to feel pain, heat, or cold. You have a greater risk of getting burned. General instructions If you were given a sedative during the procedure, it can affect you for several hours. Do not drive or operate machinery until your health care provider says that it is  safe. For the first 24 hours after the procedure: Do not sign important documents. Do not drink alcohol. Do your regular daily activities at a slower pace than normal. Eat soft foods that are easy to digest. Take over-the-counter and prescription medicines only as told by your health care provider. Keep all follow-up visits. This is important. Contact a health care provider if: You have blood in your stool 2-3 days after the procedure. Get help right away if: You have more than a small spotting of blood in your stool. You have large blood clots in your stool. You have swelling of your abdomen. You have  nausea or vomiting. You have a fever. You have increasing pain in your abdomen that is not relieved with medicine. These symptoms may be an emergency. Get help right away. Call 911. Do not wait to see if the symptoms will go away. Do not drive yourself to the hospital. Summary After the procedure, it is common to have a small amount of blood in your stool. You may also have mild cramping and bloating of your abdomen. If you were given a sedative during the procedure, it can affect you for several hours. Do not drive or operate machinery until your health care provider says that it is safe. Get help right away if you have a lot of blood in your stool, nausea or vomiting, a fever, or increased pain in your abdomen. This information is not intended to replace advice given to you by your health care provider. Make sure you discuss any questions you have with your health care provider. Document Revised: 09/23/2020 Document Reviewed: 09/23/2020 Elsevier Patient Education  [place] After This sheet gives you information about how to care for yourself after your procedure. Your health care provider may also give you more specific instructions. If you have problems or questions, contact your health care provider. What can I expect after the procedure? After the procedure, it is common to have: Tiredness. Forgetfulness about what happened after the procedure. Impaired judgment for important decisions. Nausea or vomiting. Some difficulty with balance. Follow these instructions at home: For the time period you were told by your health care provider:     Rest as needed. Do not participate in activities where you could fall or become injured. Do not drive or use machinery. Do not drink alcohol. Do not take sleeping pills or medicines that cause drowsiness. Do not make important decisions or sign legal documents. Do not take care of children on your  own. Eating and drinking Follow the diet that is recommended by your health care provider. Drink enough fluid to keep your urine pale yellow. If you vomit: Drink water, juice, or soup when you can drink without vomiting. Make sure you have little or no nausea before eating solid foods. General instructions Have a responsible adult stay with you for the time you are told. It is important to have someone help care for you until you are awake and alert. Take over-the-counter and prescription medicines only as told by your health care provider. If you have sleep apnea, surgery and certain medicines can increase your risk for breathing problems. Follow instructions from your health care provider about wearing your sleep device: Anytime you are sleeping, including during daytime naps. While taking prescription pain medicines, sleeping medicines, or medicines that make you drowsy. Avoid smoking. Keep all follow-up visits as told by your health care provider. This is important. Contact a health care provider if:  You keep feeling nauseous or you keep vomiting. You feel light-headed. You are still sleepy or having trouble with balance after 24 hours. You develop a rash. You have a fever. You have redness or swelling around the IV site. Get help right away if: You have trouble breathing. You have new-onset confusion at home. Summary For several hours after your procedure, you may feel tired. You may also be forgetful and have poor judgment. Have a responsible adult stay with you for the time you are told. It is important to have someone help care for you until you are awake and alert. Rest as told. Do not drive or operate machinery. Do not drink alcohol or take sleeping pills. Get help right away if you have trouble breathing, or if you suddenly become confused. This information is not intended to replace advice given to you by your health care provider. Make sure you discuss any questions you  have with your health care provider. Document Revised: 01/05/2021 Document Reviewed: 01/03/2019 Elsevier Patient Education  [place].

## 2021-09-09 ENCOUNTER — Other Ambulatory Visit: Payer: Self-pay

## 2021-09-09 ENCOUNTER — Encounter (HOSPITAL_COMMUNITY)
Admission: RE | Admit: 2021-09-09 | Discharge: 2021-09-09 | Disposition: A | Discharge status: Home / Self Care | Payer: Medicaid Other | Source: Ambulatory Visit | Attending: Internal Medicine | Admitting: Internal Medicine

## 2021-09-09 ENCOUNTER — Encounter (HOSPITAL_COMMUNITY): Payer: Self-pay

## 2021-09-09 VITALS — BP 115/70 | HR 74 | Temp 98.1°F | Resp 20 | Ht 64.0 in | Wt 301.0 lb

## 2021-09-09 DIAGNOSIS — R7303 Prediabetes: Secondary | ICD-10-CM | POA: Insufficient documentation

## 2021-09-09 DIAGNOSIS — Z01818 Encounter for other preprocedural examination: Secondary | ICD-10-CM | POA: Insufficient documentation

## 2021-09-09 DIAGNOSIS — R638 Other symptoms and signs concerning food and fluid intake: Secondary | ICD-10-CM | POA: Diagnosis not present

## 2021-09-09 LAB — BASIC METABOLIC PANEL
Anion gap: 7 (ref 5–15)
BUN: 11 mg/dL (ref 6–20)
CO2: 23 mmol/L (ref 22–32)
Calcium: 8.8 mg/dL — ABNORMAL LOW (ref 8.9–10.3)
Chloride: 108 mmol/L (ref 98–111)
Creatinine, Ser: 0.81 mg/dL (ref 0.44–1.00)
GFR, Estimated: >60 mL/min (ref >60)
Glucose, Bld: 100 mg/dL — ABNORMAL HIGH (ref 70–99)
Potassium: 3.7 mmol/L (ref 3.5–5.1)
Sodium: 138 mmol/L (ref 135–145)

## 2021-09-09 LAB — POCT PREGNANCY, URINE: Preg Test, Ur: NEGATIVE

## 2021-09-13 ENCOUNTER — Other Ambulatory Visit: Payer: Self-pay

## 2021-09-13 ENCOUNTER — Encounter (HOSPITAL_COMMUNITY): Payer: Self-pay

## 2021-09-13 ENCOUNTER — Ambulatory Visit (HOSPITAL_BASED_OUTPATIENT_CLINIC_OR_DEPARTMENT_OTHER): Payer: Medicaid Other | Admitting: Anesthesiology

## 2021-09-13 ENCOUNTER — Ambulatory Visit (HOSPITAL_COMMUNITY)
Admission: RE | Admit: 2021-09-13 | Discharge: 2021-09-13 | Disposition: A | Discharge status: Home / Self Care | Payer: Medicaid Other | Attending: Internal Medicine | Admitting: Internal Medicine

## 2021-09-13 ENCOUNTER — Encounter (HOSPITAL_COMMUNITY)
Admission: RE | Disposition: A | Discharge status: Home / Self Care | Payer: Self-pay | Source: Home / Self Care | Attending: Internal Medicine

## 2021-09-13 ENCOUNTER — Ambulatory Visit (HOSPITAL_COMMUNITY): Payer: Medicaid Other | Admitting: Anesthesiology

## 2021-09-13 DIAGNOSIS — K635 Polyp of colon: Secondary | ICD-10-CM | POA: Diagnosis not present

## 2021-09-13 DIAGNOSIS — K625 Hemorrhage of anus and rectum: Secondary | ICD-10-CM | POA: Diagnosis not present

## 2021-09-13 DIAGNOSIS — K648 Other hemorrhoids: Secondary | ICD-10-CM | POA: Diagnosis not present

## 2021-09-13 DIAGNOSIS — F17210 Nicotine dependence, cigarettes, uncomplicated: Secondary | ICD-10-CM | POA: Insufficient documentation

## 2021-09-13 DIAGNOSIS — D123 Benign neoplasm of transverse colon: Secondary | ICD-10-CM | POA: Insufficient documentation

## 2021-09-13 DIAGNOSIS — Z6841 Body Mass Index (BMI) 40.0 and over, adult: Secondary | ICD-10-CM

## 2021-09-13 HISTORY — PX: POLYPECTOMY: SHX5525

## 2021-09-13 HISTORY — PX: COLONOSCOPY WITH PROPOFOL: SHX5780

## 2021-09-13 LAB — GLUCOSE, CAPILLARY: Glucose-Capillary: 111 mg/dL — ABNORMAL HIGH (ref 70–99)

## 2021-09-13 SURGERY — COLONOSCOPY WITH PROPOFOL
Anesthesia: General

## 2021-09-13 MED ORDER — LIDOCAINE 2% (20 MG/ML) 5 ML SYRINGE
INTRAMUSCULAR | Status: DC | PRN
  Administered 2021-09-13: 50 mg via INTRAVENOUS

## 2021-09-13 MED ORDER — PROPOFOL 10 MG/ML IV BOLUS
INTRAVENOUS | Status: DC | PRN
  Administered 2021-09-13: 150 mg via INTRAVENOUS

## 2021-09-13 MED ORDER — LACTATED RINGERS IV SOLN: INTRAVENOUS | Status: DC

## 2021-09-13 MED ORDER — PROPOFOL 500 MG/50ML IV EMUL
INTRAVENOUS | Status: DC | PRN
  Administered 2021-09-13: 200 ug/kg/min via INTRAVENOUS

## 2021-09-13 MED ORDER — LACTATED RINGERS IV SOLN: INTRAVENOUS | Status: DC | PRN

## 2021-09-13 NOTE — H&P (Signed)
Primary Care Physician:  Denyce Robert, FNP Primary Gastroenterologist:  Dr. Abbey Chatters  Pre-Procedure History & Physical: HPI:  Gina Snow is a 31 y.o. female is here for a colonoscopy to be performed for rectal bleeding.   Past Medical History:  Diagnosis Date   Asthma    Obesity    PCO (polycystic ovaries)     Past Surgical History:  Procedure Laterality Date   NO PAST SURGERIES      Prior to Admission medications   Medication Sig Start Date End Date Taking? Authorizing Provider  acetaminophen (TYLENOL) 650 MG CR tablet Take 650 mg by mouth every 8 (eight) hours as needed for pain.   Yes [provider]  albuterol (PROVENTIL HFA;VENTOLIN HFA) 108 (90 Base) MCG/ACT inhaler Inhale 1-2 puffs into the lungs every 6 (six) hours as needed for wheezing or shortness of breath. 02/12/18  Yes Tacy Learn, PA-C  Biotin 1000 MCG tablet Take 1,000 mcg by mouth daily.   Yes [provider]  Cholecalciferol (DIALYVITE VITAMIN D 5000) 125 MCG (5000 UT) capsule Take 5,000 Units by mouth daily.   Yes [provider]  diclofenac (CATAFLAM) 50 MG tablet Take 50 mg by mouth every 12 (twelve) hours as needed (pain). 08/05/21  Yes [provider]  docusate sodium (COLACE) 100 MG capsule Take 200 mg by mouth daily.   Yes [provider]  escitalopram (LEXAPRO) 10 MG tablet Take 10 mg by mouth daily as needed (depression). 07/15/21  Yes [provider]  Etonogestrel (NEXPLANON ) Inject into the skin.   Yes [provider]  gabapentin (NEURONTIN) 600 MG tablet Take 600 mg by mouth 2 (two) times daily as needed (pain).   Yes [provider]  hydrOXYzine (ATARAX) 50 MG tablet Take 50 mg by mouth every 6 (six) hours as needed for anxiety. 07/15/21  Yes [provider]  traMADol (ULTRAM) 50 MG tablet Take 50 mg by mouth every 6 (six) hours as needed (pain).   Yes [provider]  Sod Picosulfate-Mag Ox-Cit Acd  (CLENPIQ) 10-3.5-12 MG-GM -GM/175ML SOLN Take 1 kit by mouth as directed. Patient not taking: Reported on 08/27/2021 08/11/21   Eloise Harman, DO    Allergies as of 08/11/2021   (No Known Allergies)    Family History  Problem Relation Age of Onset   Clotting disorder Mother 44       had "brain" clot?   Hypertension Maternal Grandmother    Diabetes Maternal Grandmother    Thyroid disease Maternal Grandmother    Hypertension Maternal Grandfather    Diabetes Paternal Grandmother    Colon cancer Neg Hx     Social History   Socioeconomic History   Marital status: Single    Spouse name: Buzzy Han   Number of children: 1   Years of education: 12   Highest education level: High school graduate  Occupational History   Occupation: cna    Comment: [employer]  Tobacco Use   Smoking status: Every Day    Packs/day: 0.25    Years: 8.00    Total pack years: 2.00    Types: Cigarettes   Smokeless tobacco: Never   Tobacco comments:    4/5 per day  Vaping Use   Vaping Use: Never used  Substance and Sexual Activity   Alcohol use: Not Currently    Comment: every once in a while   Drug use: No   Sexual activity: Not Currently    Birth control/protection: Implant  Other Topics Concern   Not on file  Social History Narrative   ** Merged History Encounter **       Social Determinants of Health   Financial Resource Strain: Low Risk  (04/26/2021)   Overall Financial Resource Strain (CARDIA)    Difficulty of Paying Living Expenses: Not hard at all  Food Insecurity: No Food Insecurity (04/26/2021)   Hunger Vital Sign    Worried About Running Out of Food in the Last Year: Never true    Ran Out of Food in the Last Year: Never true  Transportation Needs: No Transportation Needs (04/26/2021)   PRAPARE - Hydrologist (Medical): No    Lack of Transportation (Non-Medical): No  Physical Activity: Insufficiently Active (04/26/2021)   Exercise Vital Sign     Days of Exercise per Week: 3 days    Minutes of Exercise per Session: 30 min  Stress: No Stress Concern Present (04/26/2021)   [place]    Feeling of Stress : Not at all  Social Connections: Moderately Isolated (04/26/2021)   Social Connection and Isolation Panel [NHANES]    Frequency of Communication with Friends and Family: More than three times a week    Frequency of Social Gatherings with Friends and Family: More than three times a week    Attends Religious Services: 1 to 4 times per year    Active Member of [employer] or Organizations: No    Attends Archivist Meetings: Never    Marital Status: Never married  Intimate Partner Violence: Not At Risk (04/26/2021)   Humiliation, Afraid, Rape, and Kick questionnaire    Fear of Current or Ex-Partner: No    Emotionally Abused: No    Physically Abused: No    Sexually Abused: No    Review of Systems: See HPI, otherwise negative ROS  Physical Exam: Vital signs in last 24 hours: Temp:  [97.9 F (36.6 C)] 97.9 F (36.6 C) (07/31 1102) Pulse Rate:  [62] 62 (07/31 1102) Resp:  [22] 22 (07/31 1102) BP: (140)/(74) 140/74 (07/31 1102) SpO2:  [99 %] 99 % (07/31 1102)   General:   Alert,  Well-developed, well-nourished, pleasant and cooperative in NAD Head:  Normocephalic and atraumatic. Eyes:  Sclera clear, no icterus.   Conjunctiva pink. Ears:  Normal auditory acuity. Nose:  No deformity, discharge,  or lesions. Mouth:  No deformity or lesions, dentition normal. Neck:  Supple; no masses or thyromegaly. Lungs:  Clear throughout to auscultation.   No wheezes, crackles, or rhonchi. No acute distress. Heart:  Regular rate and rhythm; no murmurs, clicks, rubs,  or gallops. Abdomen:  Soft, nontender and nondistended. No masses, hepatosplenomegaly or hernias noted. Normal bowel sounds, without guarding, and without rebound.   Msk:  Symmetrical without gross deformities.  Normal posture. Extremities:  Without clubbing or edema. Neurologic:  Alert and  oriented x4;  grossly normal neurologically. Skin:  Intact without significant lesions or rashes. Cervical Nodes:  No significant cervical adenopathy. Psych:  Alert and cooperative. Normal mood and affect.  Impression/Plan: Gina Snow is here for a colonoscopy to be performed for rectal bleeding.   The risks of the procedure including infection, bleed, or perforation as well as benefits, limitations, alternatives and imponderables have been reviewed with the patient. Questions have been answered. All parties agreeable.

## 2021-09-13 NOTE — Transfer of Care (Signed)
Immediate Anesthesia Transfer of Care Note  Patient: Gina Snow  Procedure(s) Performed: COLONOSCOPY WITH PROPOFOL POLYPECTOMY  Patient Location: Short Stay  Anesthesia Type:General  Level of Consciousness: awake  Airway & Oxygen Therapy: Patient Spontanous Breathing  Post-op Assessment: Report given to RN  Post vital signs: Reviewed and stable  Last Vitals:  Vitals Value Taken Time  BP 131/69 09/13/21 1205  Temp 36.9 C 09/13/21 1205  Pulse 72 09/13/21 1205  Resp 18 09/13/21 1205  SpO2 100 % 09/13/21 1205    Last Pain:  Vitals:   09/13/21 1205  TempSrc: Oral  PainSc: 0-No pain         Complications: No notable events documented.

## 2021-09-13 NOTE — Discharge Instructions (Addendum)
  Colonoscopy Discharge Instructions  Read the instructions outlined below and refer to this sheet in the next few weeks. These discharge instructions provide you with general information on caring for yourself after you leave the hospital. Your doctor may also give you specific instructions. While your treatment has been planned according to the most current medical practices available, unavoidable complications occasionally occur.   ACTIVITY You may resume your regular activity, but move at a slower pace for the next 24 hours.  Take frequent rest periods for the next 24 hours.  Walking will help get rid of the air and reduce the bloated feeling in your belly (abdomen).  No driving for 24 hours (because of the medicine (anesthesia) used during the test).   Do not sign any important legal documents or operate any machinery for 24 hours (because of the anesthesia used during the test).  NUTRITION Drink plenty of fluids.  You may resume your normal diet as instructed by your doctor.  Begin with a light meal and progress to your normal diet. Heavy or fried foods are harder to digest and may make you feel sick to your stomach (nauseated).  Avoid alcoholic beverages for 24 hours or as instructed.  MEDICATIONS You may resume your normal medications unless your doctor tells you otherwise.  WHAT YOU CAN EXPECT TODAY Some feelings of bloating in the abdomen.  Passage of more gas than usual.  Spotting of blood in your stool or on the toilet paper.  IF YOU HAD POLYPS REMOVED DURING THE COLONOSCOPY: No aspirin products for 7 days or as instructed.  No alcohol for 7 days or as instructed.  Eat a soft diet for the next 24 hours.  FINDING OUT THE RESULTS OF YOUR TEST Not all test results are available during your visit. If your test results are not back during the visit, make an appointment with your caregiver to find out the results. Do not assume everything is normal if you have not heard from your  caregiver or the medical facility. It is important for you to follow up on all of your test results.  SEEK IMMEDIATE MEDICAL ATTENTION IF: You have more than a spotting of blood in your stool.  Your belly is swollen (abdominal distention).  You are nauseated or vomiting.  You have a temperature over 101.  You have abdominal pain or discomfort that is severe or gets worse throughout the day.   Your colonoscopy revealed 1 polyp(s) which I removed successfully. Await pathology results, my office will contact you. I recommend repeating colonoscopy in 5 years for surveillance purposes if precancerous, otherwise repeat at age 57.   You do have internal hemorrhoids.  Follow-up with Roseanne Kaufman for further banding.  MESSAGE LEFT AT OFFICE TO CALL PATIENT FOR APPOINTMENT WITH ANNA FOR HEMORRHOID BANDING    I hope you have a great rest of your week!  Elon Alas. Abbey Chatters, D.O. Gastroenterology and Hepatology [facility]

## 2021-09-13 NOTE — Anesthesia Preprocedure Evaluation (Signed)
Anesthesia Evaluation  Patient identified by MRN, date of birth, ID band Patient awake    Reviewed: Allergy & Precautions, H&P , NPO status , Patient's Chart, lab work & pertinent test results, reviewed documented beta blocker date and time   Airway Mallampati: II  TM Distance: >3 FB Neck ROM: full    Dental no notable dental hx.    Pulmonary neg pulmonary ROS, Current Smoker,    Pulmonary exam normal breath sounds clear to auscultation       Cardiovascular Exercise Tolerance: Good negative cardio ROS   Rhythm:regular Rate:Normal     Neuro/Psych negative neurological ROS  negative psych ROS   GI/Hepatic negative GI ROS, Neg liver ROS,   Endo/Other  Morbid obesity  Renal/GU negative Renal ROS  negative genitourinary   Musculoskeletal   Abdominal   Peds  Hematology negative hematology ROS (+)   Anesthesia Other Findings   Reproductive/Obstetrics negative OB ROS                             Anesthesia Physical Anesthesia Plan  ASA: 3  Anesthesia Plan: General   Post-op Pain Management:    Induction:   PONV Risk Score and Plan: Propofol infusion  Airway Management Planned:   Additional Equipment:   Intra-op Plan:   Post-operative Plan:   Informed Consent: I have reviewed the patients History and Physical, chart, labs and discussed the procedure including the risks, benefits and alternatives for the proposed anesthesia with the patient or authorized representative who has indicated his/her understanding and acceptance.     Dental Advisory Given  Plan Discussed with: CRNA  Anesthesia Plan Comments:         Anesthesia Quick Evaluation

## 2021-09-13 NOTE — Anesthesia Postprocedure Evaluation (Signed)
Anesthesia Post Note  Patient: Gina Snow  Procedure(s) Performed: COLONOSCOPY WITH PROPOFOL POLYPECTOMY  Patient location during evaluation: Specials Recovery Anesthesia Type: General Level of consciousness: awake and alert Pain management: pain level controlled Vital Signs Assessment: post-procedure vital signs reviewed and stable Respiratory status: spontaneous breathing Cardiovascular status: blood pressure returned to baseline and stable Postop Assessment: no apparent nausea or vomiting Anesthetic complications: no   No notable events documented.   Last Vitals:  Vitals:   09/13/21 1102 09/13/21 1205  BP: 140/74 131/69  Pulse: 62 72  Resp: (!) 22 18  Temp: 36.6 C 36.9 C  SpO2: 99% 100%    Last Pain:  Vitals:   09/13/21 1205  TempSrc: Oral  PainSc: 0-No pain                 Ciaira Natividad

## 2021-09-13 NOTE — Op Note (Signed)
[facility] Patient Name: Gina Snow Procedure Date: 09/13/2021 11:35 AM MRN: [id number] Date of Birth: 1990-10-31 Attending MD: Elon Alas. Abbey Chatters DO CSN: [id number] Age: 31 Admit Type: Outpatient Procedure:                Colonoscopy Indications:              Rectal bleeding Providers:                Elon Alas. Abbey Chatters, DO, Lambert Mody, Aram Candela Referring MD:              Medicines:                See the Anesthesia note for documentation of the                            administered medications Complications:            No immediate complications. Estimated Blood Loss:     Estimated blood loss was minimal. Procedure:                Pre-Anesthesia Assessment:                           - The anesthesia plan was to use monitored                            anesthesia care (MAC).                           After obtaining informed consent, the colonoscope                            was passed under direct vision. Throughout the                            procedure, the patient's blood pressure, pulse, and                            oxygen saturations were monitored continuously. The                            [id number]-HQ190L ([id number]) scope was introduced through                            the anus and advanced to the the cecum, identified                            by appendiceal orifice and ileocecal valve. The                            colonoscopy was performed without difficulty. The                            patient tolerated the procedure well. The quality  of the bowel preparation was evaluated using the                            BBPS [facility] Bowel Preparation Scale) with scores                            of: Right Colon = 3, Transverse Colon = 3 and Left                            Colon = 3 (entire mucosa seen well with no residual                            staining, small fragments of stool or opaque                             liquid). The total BBPS score equals 9. Scope In: 11:50:54 AM Scope Out: 11:58:26 AM Scope Withdrawal Time: 0 hours 6 minutes 13 seconds  Total Procedure Duration: 0 hours 7 minutes 32 seconds  Findings:      Hemorrhoids were found on perianal exam.      Non-bleeding internal hemorrhoids were found during retroflexion.      A 6 mm polyp was found in the transverse colon. The polyp was sessile.       The polyp was removed with a cold snare. Resection and retrieval were       complete.      The exam was otherwise without abnormality. Impression:               - Hemorrhoids found on perianal exam.                           - Non-bleeding internal hemorrhoids.                           - One 6 mm polyp in the transverse colon, removed                            with a cold snare. Resected and retrieved.                           - The examination was otherwise normal. Moderate Sedation:      Per Anesthesia Care Recommendation:           - Patient has a contact number available for                            emergencies. The signs and symptoms of potential                            delayed complications were discussed with the                            patient. Return to normal activities tomorrow.  Written discharge instructions were provided to the                            patient.                           - Continue present medications.                           - Resume previous diet.                           - Await pathology results.                           - Repeat colonoscopy in 5 years for surveillance.                           - Return to GI office at the next available                            appointment with Roseanne Kaufman for banding Procedure Code(s):        --- Professional ---                           [id number], Colonoscopy, flexible; with removal of                            tumor(s), polyp(s), or other lesion(s) by  snare                            technique Diagnosis Code(s):        --- Professional ---                           K64.8, Other hemorrhoids                           K63.5, Polyp of colon                           K62.5, Hemorrhage of anus and rectum CPT copyright 2019 American Medical Association. All rights reserved. The codes documented in this report are preliminary and upon coder review may  be revised to meet current compliance requirements. Elon Alas. Abbey Chatters, DO [place] Abbey Chatters, DO 09/13/2021 12:01:33 PM This report has been signed electronically. Number of Addenda: 0

## 2021-09-14 LAB — SURGICAL PATHOLOGY

## 2021-09-16 ENCOUNTER — Ambulatory Visit: Payer: Medicaid Other | Admitting: Gastroenterology

## 2021-09-16 ENCOUNTER — Encounter (HOSPITAL_COMMUNITY): Payer: Self-pay | Admitting: Internal Medicine

## 2021-09-16 VITALS — BP 130/82 | HR 75 | Temp 97.5°F | Ht 64.0 in | Wt 292.4 lb

## 2021-09-16 DIAGNOSIS — K641 Second degree hemorrhoids: Secondary | ICD-10-CM

## 2021-09-16 NOTE — Progress Notes (Signed)
    [place] BANDING PROCEDURE NOTE  Gina Snow is a 31 y.o. female presenting today for consideration of hemorrhoid banding. Last colonoscopy July 2023 with hemorrhoids and 6 mm polyp (sessile serrated). She has already had left lateral banding completed.    The patient presents with symptomatic grade 2 hemorrhoids, unresponsive to maximal medical therapy, requesting rubber band ligation of her hemorrhoidal disease. All risks, benefits, and alternative forms of therapy were described and informed consent was obtained.  The decision was made to band the right posterior internal hemorrhoid, and the [place] was used to perform band ligation without complication. Digital anorectal examination was then performed to assure proper positioning of the band, and to adjust the banded tissue as required. The patient was discharged home without pain or other issues. Dietary and behavioral recommendations were given, along with follow-up instructions. The patient will return in several weeks for followup and possible additional banding as required.  No complications were encountered and the patient tolerated the procedure well.   Annitta Needs, PhD, ANP-BC [facility]

## 2021-09-16 NOTE — Patient Instructions (Signed)
Please continue to avoid straining.  You should limit your toilet time to 2-3 minutes at the most.   Continue to avoid constipation.  Please call me with any concerns or issues!  I will see you in follow-up for additional banding in several weeks.  I recommend Benefiber 2 teaspoons daily!  I enjoyed seeing you again today! As you know, I value our relationship and want to provide genuine, compassionate, and quality care. I welcome your feedback. If you receive a survey regarding your visit,  I greatly appreciate you taking time to fill this out. See you next time!  Annitta Needs, PhD, ANP-BC [facility]

## 2021-09-30 ENCOUNTER — Encounter: Payer: Medicaid Other | Admitting: Gastroenterology

## 2021-10-07 ENCOUNTER — Ambulatory Visit: Payer: Medicaid Other | Admitting: Gastroenterology

## 2021-10-07 ENCOUNTER — Encounter: Payer: Self-pay | Admitting: Gastroenterology

## 2021-10-07 VITALS — BP 146/86 | HR 97 | Temp 98.1°F | Ht 64.0 in | Wt 290.4 lb

## 2021-10-07 DIAGNOSIS — K641 Second degree hemorrhoids: Secondary | ICD-10-CM

## 2021-10-07 NOTE — Progress Notes (Signed)
       [place] BANDING PROCEDURE NOTE  Gina Snow is a 31 y.o. female presenting today for consideration of hemorrhoid banding. Last colonoscopy July 2023 with hemorrhoids and 6 mm polyp (sessile serrated). She has had left lateral and right posterior banded. Notes significant improvement in symptoms. Still with pressure.    The patient presents with symptomatic grade 2 hemorrhoids, unresponsive to maximal medical therapy, requesting rubber band ligation of her hemorrhoidal disease. All risks, benefits, and alternative forms of therapy were described and informed consent was obtained.  The decision was made to band the right anterior internal hemorrhoid, and the [place] was used to perform band ligation without complication. Digital anorectal examination was then performed to assure proper positioning of the band, and to adjust the banded tissue as required. The patient was discharged home without pain or other issues. Dietary and behavioral recommendations were given, along with follow-up instructions. The patient will return as needed for follow-up.   No complications were encountered and the patient tolerated the procedure well.   Annitta Needs, PhD, ANP-BC [facility]

## 2021-10-07 NOTE — Patient Instructions (Signed)
Please continue to avoid straining.  You should limit your toilet time to 2-3 minutes at the most.   Continue to avoid constipation.  Please call me with any concerns or issues!  I will see you back as needed!     I enjoyed seeing you again today! As you know, I value our relationship and want to provide genuine, compassionate, and quality care. I welcome your feedback. If you receive a survey regarding your visit,  I greatly appreciate you taking time to fill this out. See you next time!  Annitta Needs, PhD, ANP-BC [facility]enter[facility]

## 2021-11-04 ENCOUNTER — Encounter: Payer: Medicaid Other | Admitting: Gastroenterology

## 2022-01-01 ENCOUNTER — Telehealth: Payer: Medicaid Other | Admitting: Nurse Practitioner

## 2022-01-01 DIAGNOSIS — J00 Acute nasopharyngitis [common cold]: Secondary | ICD-10-CM

## 2022-01-01 NOTE — Progress Notes (Signed)
Based on what you shared with me it looks like you have sinusitis,that should be evaluated in a face to face office visit. We are not able to send prescriptions to [facility].  NOTE: There will be NO CHARGE for this eVisit   If you are having a true medical emergency please call 911.      For an urgent face to face visit, [facility] has six urgent care centers for your convenience:     [facility] [place] [facility] [place] Get Driving Directions [phone] [address], [place] [address]    [facility]) Get Driving Directions [phone] [place], [place] [address]  [facility] [place] ([place]) Get Driving Directions [phone] [address] [place],  [place]  [address]  [facility] Get Driving Directions [phone] [address] [place], [place], [place] [address]   [facility] Get Driving Directions  [phone] [address] [place], [place] [address]   [facility] [place] Get Driving Directions [phone] [address] [place],  [address]  Your MyChart E-visit questionnaire answers were reviewed by a board certified advanced clinical practitioner to complete your personal care plan based on your specific symptoms.  Thank you for using e-Visits.

## 2022-04-14 ENCOUNTER — Encounter: Payer: Self-pay | Admitting: Radiology

## 2022-04-25 ENCOUNTER — Encounter: Payer: Self-pay | Admitting: Women's Health

## 2022-04-25 ENCOUNTER — Ambulatory Visit (INDEPENDENT_AMBULATORY_CARE_PROVIDER_SITE_OTHER): Payer: Medicaid Other | Admitting: Women's Health

## 2022-04-25 ENCOUNTER — Other Ambulatory Visit (HOSPITAL_COMMUNITY)
Admission: RE | Admit: 2022-04-25 | Discharge: 2022-04-25 | Disposition: A | Discharge status: Home / Self Care | Payer: Medicaid Other | Source: Ambulatory Visit | Attending: Women's Health | Admitting: Women's Health

## 2022-04-25 VITALS — BP 121/83 | HR 80 | Ht 64.0 in | Wt 291.6 lb

## 2022-04-25 DIAGNOSIS — Z01419 Encounter for gynecological examination (general) (routine) without abnormal findings: Secondary | ICD-10-CM | POA: Diagnosis present

## 2022-04-25 NOTE — Progress Notes (Signed)
WELL-WOMAN EXAMINATION Patient name: Gina Snow MRN [id number]  Date of birth: 22-Jul-1990 Chief Complaint:   Annual Exam  History of Present Illness:   Gina Snow is a 32 y.o. G57P1102 African-American female being seen today for a routine well-woman exam.  Current complaints: small bump Rt upper thigh x ~69yr no pain/problems, doesn't bother her  [facility]     does not desire labs No LMP recorded. Patient has had an implant. The current method of family planning is Nexplanon inserted 03/15/21 Last pap 09/02/20. Results were: NILM w/ HRHPV negative. H/O abnormal pap: yes 2019  HSIL Last mammogram: never. Results were: N/A. Family h/o breast cancer: no Last colonoscopy: 09/13/21 d/t rectal bleeding. Results were: hemorrhoids, benign polyp. Family h/o colorectal cancer: no     04/25/2022    8:49 AM 12/17/2020    9:43 AM 09/02/2020   11:40 AM 10/03/2017    1:55 PM 09/17/2012    1:53 PM  Depression screen PHQ 2/9  Decreased Interest 0 2 1 0 0  Down, Depressed, Hopeless 1 0 2 0 1  PHQ - 2 Score '1 2 3 '$ 0 1  Altered sleeping '1 2 1 1   '$ Tired, decreased energy 0 '3 1 2   '$ Change in appetite 0 0 0 1   Feeling bad or failure about yourself  0 0 0 0   Trouble concentrating 0 0 0 0   Moving slowly or fidgety/restless 0 0 0 0   Suicidal thoughts 0 0 0 0   PHQ-9 Score '2 7 5 4         '$ 04/25/2022    8:49 AM 12/17/2020    9:43 AM 09/02/2020   11:40 AM  GAD 7 : Generalized Anxiety Score  Nervous, Anxious, on Edge 1 0 0  Control/stop worrying 0 0 1  Worry too much - different things 0 0 0  Trouble relaxing 0 1 1  Restless 0 0 0  Easily annoyed or irritable 0 1 0  Afraid - awful might happen 0 0 0  Total GAD 7 Score '1 2 2     '$ Review of Systems:   Pertinent items are noted in HPI Denies any headaches, blurred vision, fatigue, shortness of breath, chest pain, abdominal pain, abnormal vaginal discharge/itching/odor/irritation, problems with periods, bowel movements,  urination, or intercourse unless otherwise stated above. Pertinent History Reviewed:  Reviewed past medical,surgical, social and family history.  Reviewed problem list, medications and allergies. Physical Assessment:   Vitals:   04/25/22 0843  BP: 121/83  Pulse: 80  Weight: 291 lb 9.6 oz (132.3 kg)  Height: '5\' 4"'$  (1.626 m)  Body mass index is 50.05 kg/m.        Physical Examination:   General appearance - well appearing, and in no distress  Mental status - alert, oriented to person, place, and time  Psych:  She has a normal mood and affect  Skin - warm and dry, normal color, no suspicious lesions noted  Chest - effort normal, all lung fields clear to auscultation bilaterally  Heart - normal rate and regular rhythm  Neck:  midline trachea, no thyromegaly or nodules  Breasts - breasts appear normal, no suspicious masses, no skin or nipple changes or  axillary nodes  Abdomen - soft, nontender, nondistended, no masses or organomegaly  Pelvic - VULVA: normal appearing vulva with no masses, tenderness or lesions  VAGINA: normal appearing vagina with normal color and discharge, no lesions  CERVIX: normal  appearing cervix without discharge or lesions, no CMT  Thin prep pap is done w/ reflex HR HPV cotesting  UTERUS: uterus is felt to be normal size, shape, consistency and nontender   ADNEXA: No adnexal masses or tenderness noted.  Extremities:  No swelling or varicosities noted, ~1cm firm mobile superficial cyst Rt anterior upper thigh, no pain  Chaperone: Latisha Cresenzo    No results found for this or any previous visit (from the past 24 hour(s)).  Assessment & Plan:  1) Well-Woman Exam  2) Superficial cyst anterior Rt upper thigh> x38yr doesn't bother her, to monitor- if any changes let us/PCP know  3) H/O abnormal pap  Labs/procedures today: pap  Mammogram: @ 30yo, or sooner if problems Colonoscopy: [facility]  No orders of the defined types were placed in this  encounter.   Meds: No orders of the defined types were placed in this encounter.   Follow-up: Return in about 1 year (around 04/25/2023) for Physical.  KiRoma SchanzNM, WHNP-BC 04/25/2022 9:15 AM

## 2022-04-25 NOTE — Addendum Note (Signed)
Addended by: Octaviano Glow on: 04/25/2022 12:22 PM   Modules accepted: Orders

## 2022-04-25 NOTE — Patient Instructions (Signed)
Primary Care Providers Dr. Zack Hall ([place]) [phone] [facility] [phone] [facility] ([place]) [phone] [facility] ([place]) [phone] [facility] ([place]) [phone] [facility] ([place]) [phone] [facility] [place] [phone] [facility] [phone]  

## 2022-04-29 LAB — CYTOLOGY - PAP
Chlamydia: NEGATIVE
Comment: NEGATIVE
Comment: NEGATIVE
Comment: NEGATIVE
Comment: NORMAL
Diagnosis: NEGATIVE
High risk HPV: NEGATIVE
Neisseria Gonorrhea: NEGATIVE
Trichomonas: NEGATIVE

## 2022-08-26 ENCOUNTER — Ambulatory Visit: Payer: Medicaid Other | Admitting: Orthopedic Surgery

## 2022-08-27 ENCOUNTER — Emergency Department (HOSPITAL_COMMUNITY)
Admission: EM | Admit: 2022-08-27 | Discharge: 2022-08-27 | Disposition: A | Discharge status: Home / Self Care | Payer: Medicaid Other | Attending: Emergency Medicine | Admitting: Emergency Medicine

## 2022-08-27 ENCOUNTER — Other Ambulatory Visit: Payer: Self-pay

## 2022-08-27 DIAGNOSIS — M545 Low back pain, unspecified: Secondary | ICD-10-CM | POA: Insufficient documentation

## 2022-08-27 LAB — URINALYSIS, ROUTINE W REFLEX MICROSCOPIC
Bilirubin Urine: NEGATIVE
Glucose, UA: NEGATIVE mg/dL
Hgb urine dipstick: NEGATIVE
Ketones, ur: NEGATIVE mg/dL
Nitrite: NEGATIVE
Protein, ur: NEGATIVE mg/dL
Specific Gravity, Urine: 1.019 (ref 1.005–1.030)
pH: 6 (ref 5.0–8.0)

## 2022-08-27 MED ORDER — LIDOCAINE 5 % EX PTCH: 1.0000 | MEDICATED_PATCH | CUTANEOUS | 0 refills | Status: DC

## 2022-08-27 MED ORDER — LIDOCAINE 5 % EX PTCH
1.0000 | MEDICATED_PATCH | CUTANEOUS | Status: DC
  Administered 2022-08-27: 1 via TRANSDERMAL
  Filled 2022-08-27: qty 1

## 2022-08-27 NOTE — Discharge Instructions (Addendum)
Evaluation today was overall reassuring.  Suspect you have a muscular injury in your lower back.  Recommend conservative treatment at home.  You can take ibuprofen also but please do not take it excessively as it can put you at risk for perforation (a hole) of your stomach.  Also recommend you follow-up with your PCP for your ongoing back pain.  Your labs indicated that you do have some white blood cells in your urine.  At this time and does not warrant treatment for UTI but if you do start to experience burning urination, malodorous urine or blood in the urine please seek treatment for UTI.

## 2022-08-27 NOTE — ED Triage Notes (Signed)
Pt c/o back pain mid to lower x  5 days.

## 2022-08-27 NOTE — ED Provider Notes (Signed)
[place] EMERGENCY DEPARTMENT AT [facility] Provider Note   CSN: [id number] Arrival date & time: 08/27/22  0935     History  Chief Complaint  Patient presents with   Back Pain   HPI Gina Snow is a 32 y.o. female presenting for lower back pain.  Started 5 days ago.  Located in the midline of her lower back and radiates to the left lower back.  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  Denies saddle anesthesia, urinary or bowel changes.  Denies fever.  Denies any trauma to the back.  States she has tried ibuprofen, Tylenol, muscle relaxers, "painkillers" with no relief.  Pain is worse with rotation, flexion extension of her back.   Back Pain      Home Medications Prior to Admission medications   Medication Sig Start Date End Date Taking? Authorizing Provider  acetaminophen (TYLENOL) 650 MG CR tablet Take 650 mg by mouth every 8 (eight) hours as needed for pain.    [provider]  albuterol (PROVENTIL HFA;VENTOLIN HFA) 108 (90 Base) MCG/ACT inhaler Inhale 1-2 puffs into the lungs every 6 (six) hours as needed for wheezing or shortness of breath. 02/12/18   Jeannie Fend, PA-C  Biotin 1000 MCG tablet Take 1,000 mcg by mouth daily.    [provider]  Cholecalciferol (DIALYVITE VITAMIN D 5000) 125 MCG (5000 UT) capsule Take 5,000 Units by mouth daily.    [provider]  escitalopram (LEXAPRO) 10 MG tablet Take 10 mg by mouth daily as needed (depression). Patient not taking: Reported on 04/25/2022 07/15/21   [provider]  Etonogestrel (NEXPLANON [place]) Inject into the skin.    [provider]  hydrOXYzine (ATARAX) 50 MG tablet Take 50 mg by mouth every 6 (six) hours as needed for anxiety. Patient not taking: Reported on 04/25/2022 07/15/21   [provider]      Allergies    Patient has no known allergies.    Review of Systems   Review of Systems  Musculoskeletal:  Positive for  back pain.    Physical Exam Updated Vital Signs BP 119/72   Pulse 67   Temp 97.6 F (36.4 C)   Resp 17   Ht 5\' 4"  (1.626 m)   Wt 127 kg   SpO2 97%   BMI 48.06 kg/m  Physical Exam Constitutional:      Appearance: Normal appearance.  HENT:     Head: Normocephalic.     Nose: Nose normal.  Eyes:     Conjunctiva/sclera: Conjunctivae normal.  Pulmonary:     Effort: Pulmonary effort is normal.  Abdominal:     General: Abdomen is flat. There is no distension.     Palpations: Abdomen is soft.     Tenderness: There is no abdominal tenderness. There is no right CVA tenderness or left CVA tenderness.  Musculoskeletal:     Thoracic back: Normal. Normal range of motion.     Lumbar back: Tenderness present. No swelling, edema, deformity, signs of trauma or lacerations. Normal range of motion.       Back:     Comments: Tenderness elicited with flexion of her lower back.  Neurological:     Mental Status: She is alert.  Psychiatric:        Mood and Affect: Mood normal.     ED Results / Procedures / Treatments   Labs (all labs ordered are listed, but only abnormal results are  displayed) Labs Reviewed  URINALYSIS, ROUTINE W REFLEX MICROSCOPIC - Abnormal; Notable for the following components:      Result Value   APPearance HAZY (*)    Leukocytes,Ua LARGE (*)    Bacteria, UA RARE (*)    All other components within normal limits    EKG None  Radiology No results found.  Procedures Procedures    Medications Ordered in ED Medications  lidocaine (LIDODERM) 5 % 1 patch (has no administration in time range)    ED Course/ Medical Decision Making/ A&P                             Medical Decision Making Amount and/or Complexity of Data Reviewed Labs: ordered.   32 year old well-appearing female presenting for lower back pain.  Exam notable for paraspinal left sided lumbar tenderness.  Patient denied red flag symptoms of back pain.  It is worse with movement.  Suspect  soft tissue injury.  Did consider nephrolithiasis but unlikely given no urinary changes, no fever and no CVA tenderness.  Labs did reveal pyuria.  Discussed with patient if she started to have urinary symptoms she should seek out treatment for UTI.  Applied Lidoderm patch for her back pain.  Advised continued conservative treatment with NSAIDs at home.  Did discuss the adverse effects of excessive use of NSAIDs as well.  Advised to follow-up with her PCP.  Vital stable.  Discharged home.        Final Clinical Impression(s) / ED Diagnoses Final diagnoses:  Low back pain, unspecified back pain laterality, unspecified chronicity, unspecified whether sciatica present    Rx / DC Orders ED Discharge Orders     None         Gareth Eagle, PA-C 08/27/22 1037    Bethann Berkshire, MD 08/29/22 1649

## 2022-11-15 IMAGING — DX DG KNEE COMPLETE 4+V*R*
4 series · 4 of 4 positions shown · non-contrast
Comparison: Left knee radiographs 07/05/2017

CLINICAL DATA: Chronic bilateral knee pain for years,
left-greater-than-right.

EXAM:
RIGHT KNEE - COMPLETE 4+ VIEW; LEFT KNEE - COMPLETE 4+ VIEW

[knee ap]
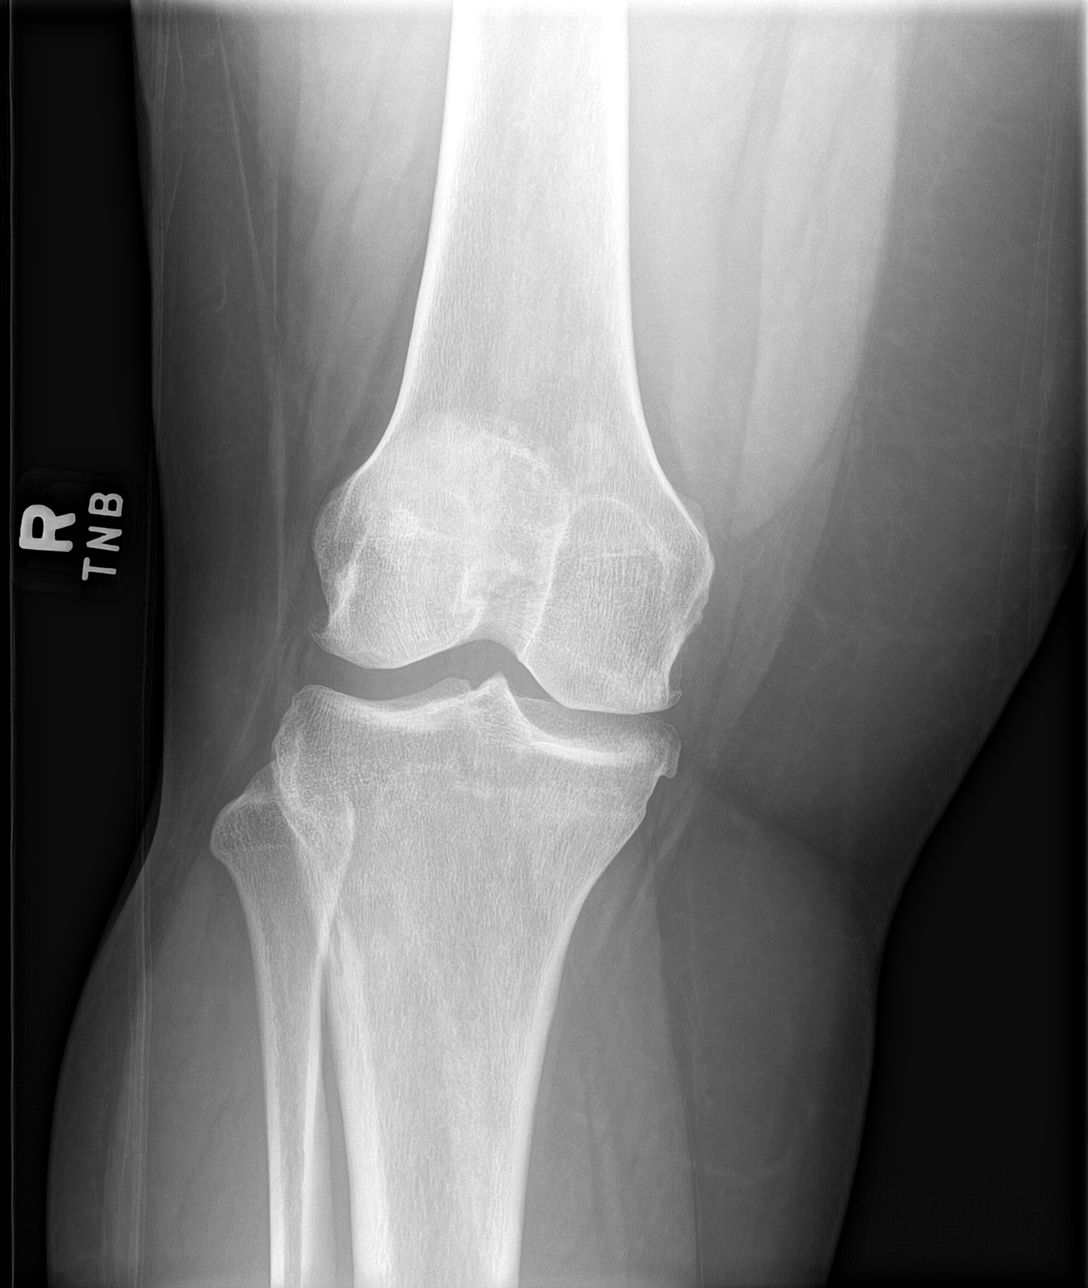

[knee obl (1 of 2)]
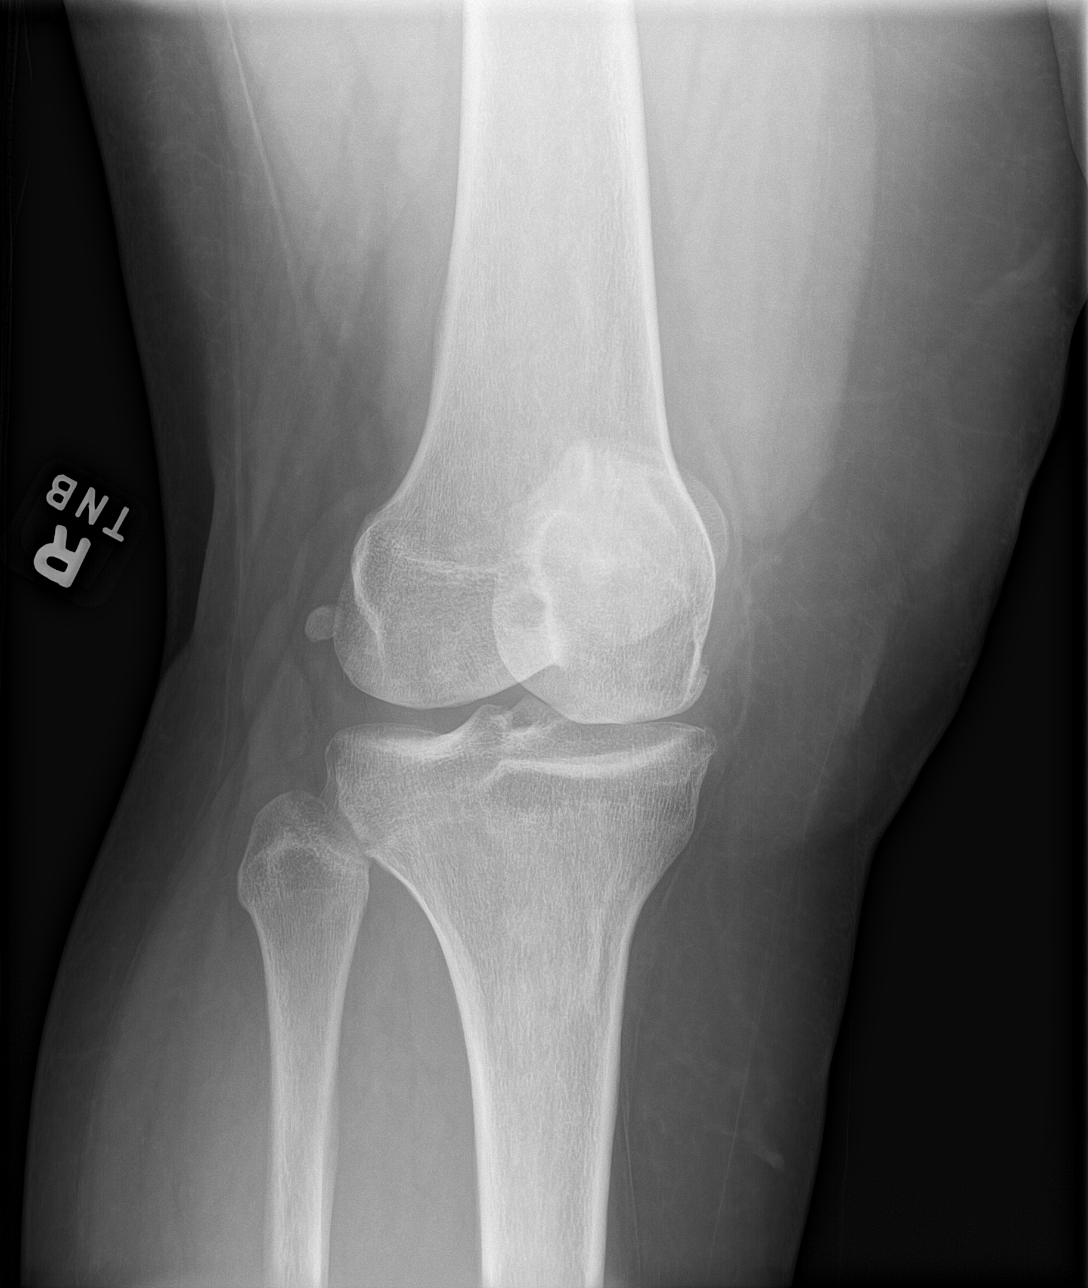

[knee obl (2 of 2)]
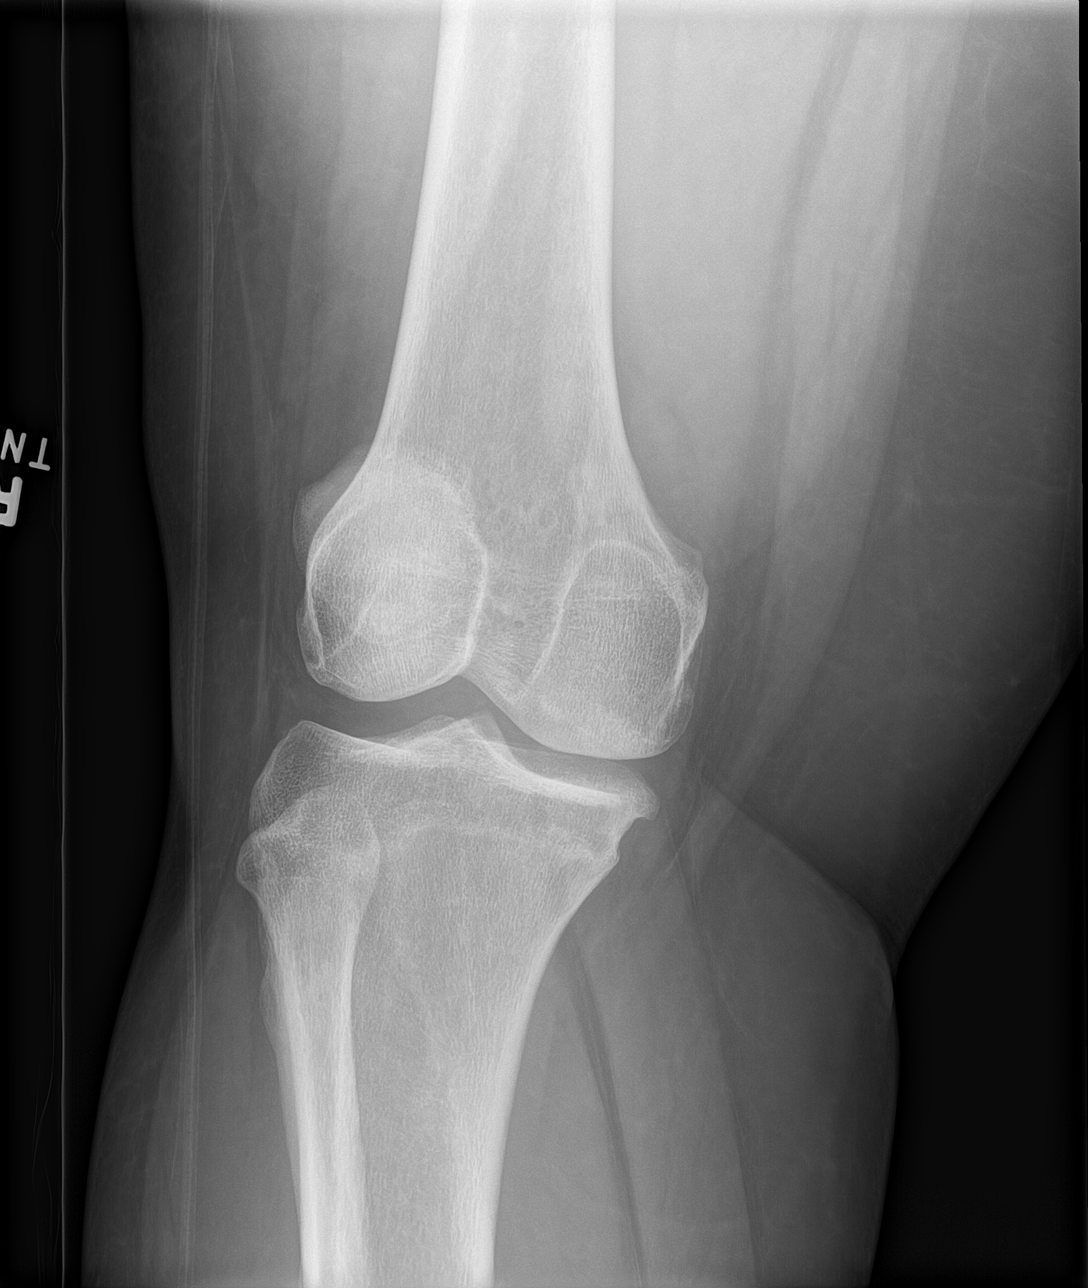

[knee lat]
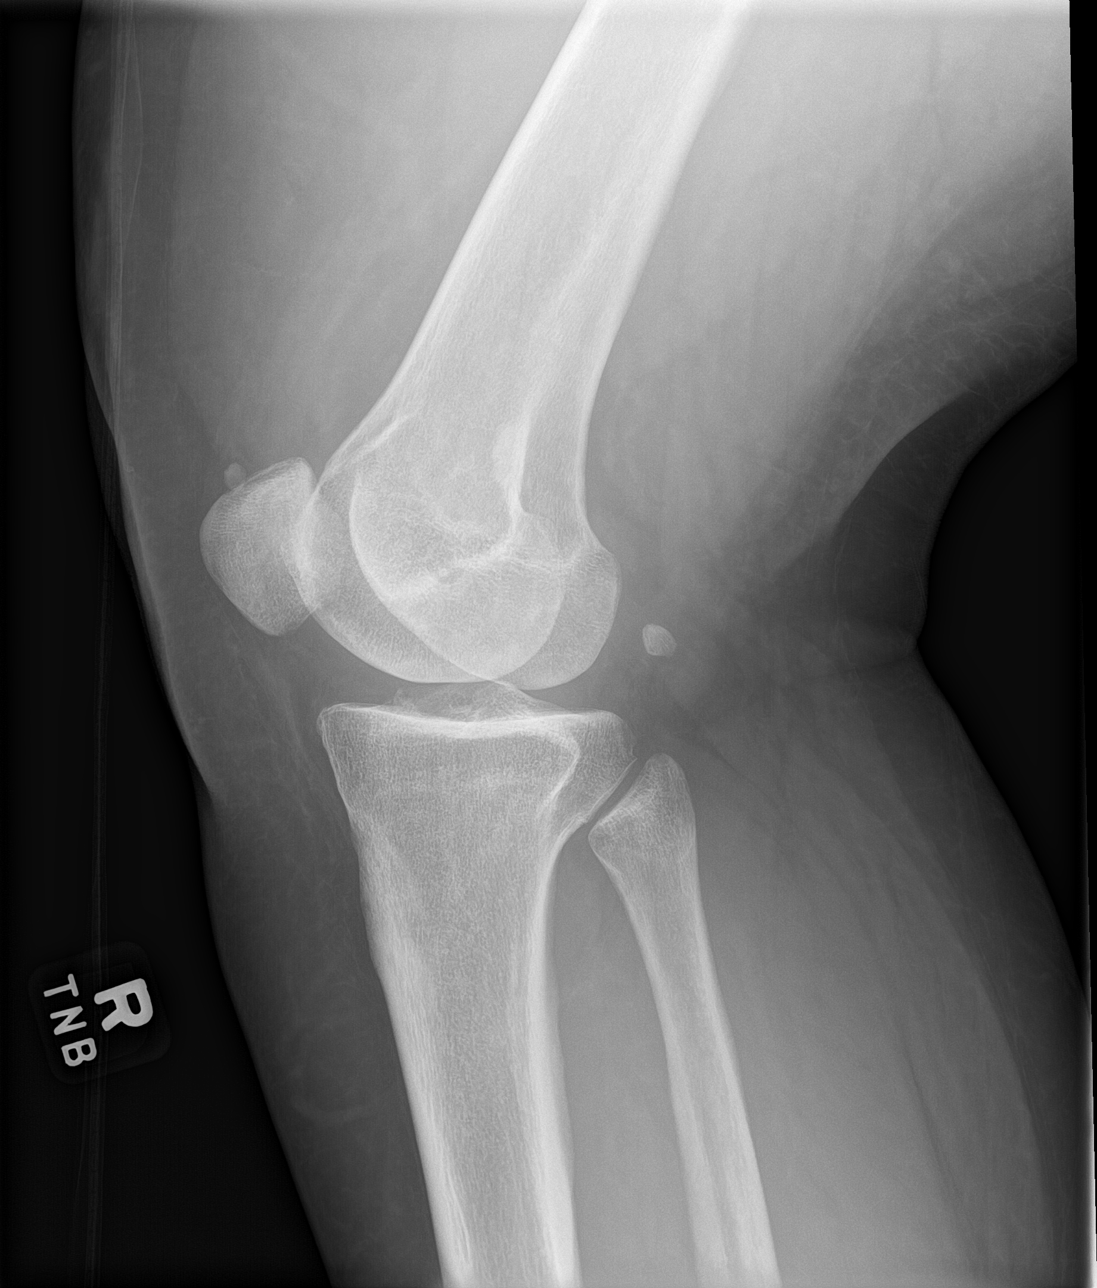

[4 of 4 positions shown; findings below may reference images not displayed]

FINDINGS: Left knee:

Mild medial compartment joint space narrowing and mild-to-moderate
peripheral degenerative osteophytes, mildly worsened from
07/05/2017. The lateral compartment joint space is maintained with
minimal peripheral degenerative osteophytosis. No joint effusion. No
acute fracture or dislocation. Small well corticated chronic ossicle
at the superior aspect of the tibial tubercle is unchanged, likely
the sequela of remote Osgood-Schlatter disease.

Right knee:

Mild medial joint space narrowing with mild-to-moderate peripheral
degenerative osteophytosis. The lateral compartment joint spaces
maintained with minimal peripheral degenerative osteophytosis. No
joint effusion. No acute fracture or dislocation. Small ossicle just
superior to the patella on lateral view, possible tiny loose body.
IMPRESSION: 1. Left knee mild-to-moderate medial compartment osteoarthritis,
mildly worsened from prior.
2. Right knee mild to moderate medial compartment osteoarthritis.

## 2022-11-15 IMAGING — DX DG KNEE COMPLETE 4+V*L*
4 series · 4 of 4 positions shown · non-contrast
Comparison: Left knee radiographs 07/05/2017

CLINICAL DATA: Chronic bilateral knee pain for years,
left-greater-than-right.

EXAM:
RIGHT KNEE - COMPLETE 4+ VIEW; LEFT KNEE - COMPLETE 4+ VIEW

[knee ap]
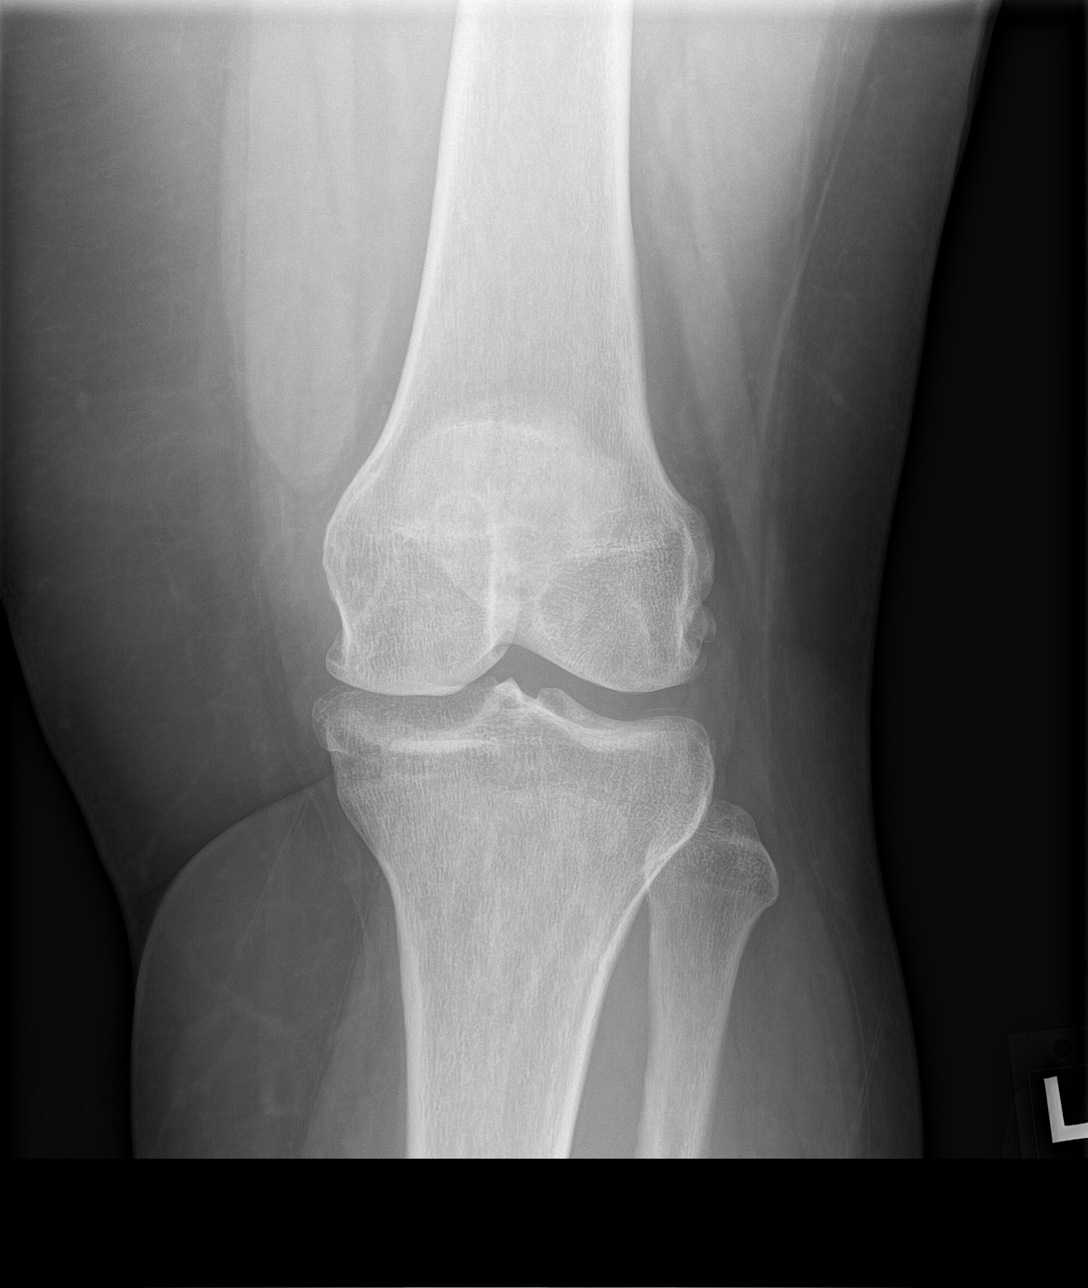

[knee obl (1 of 2)]
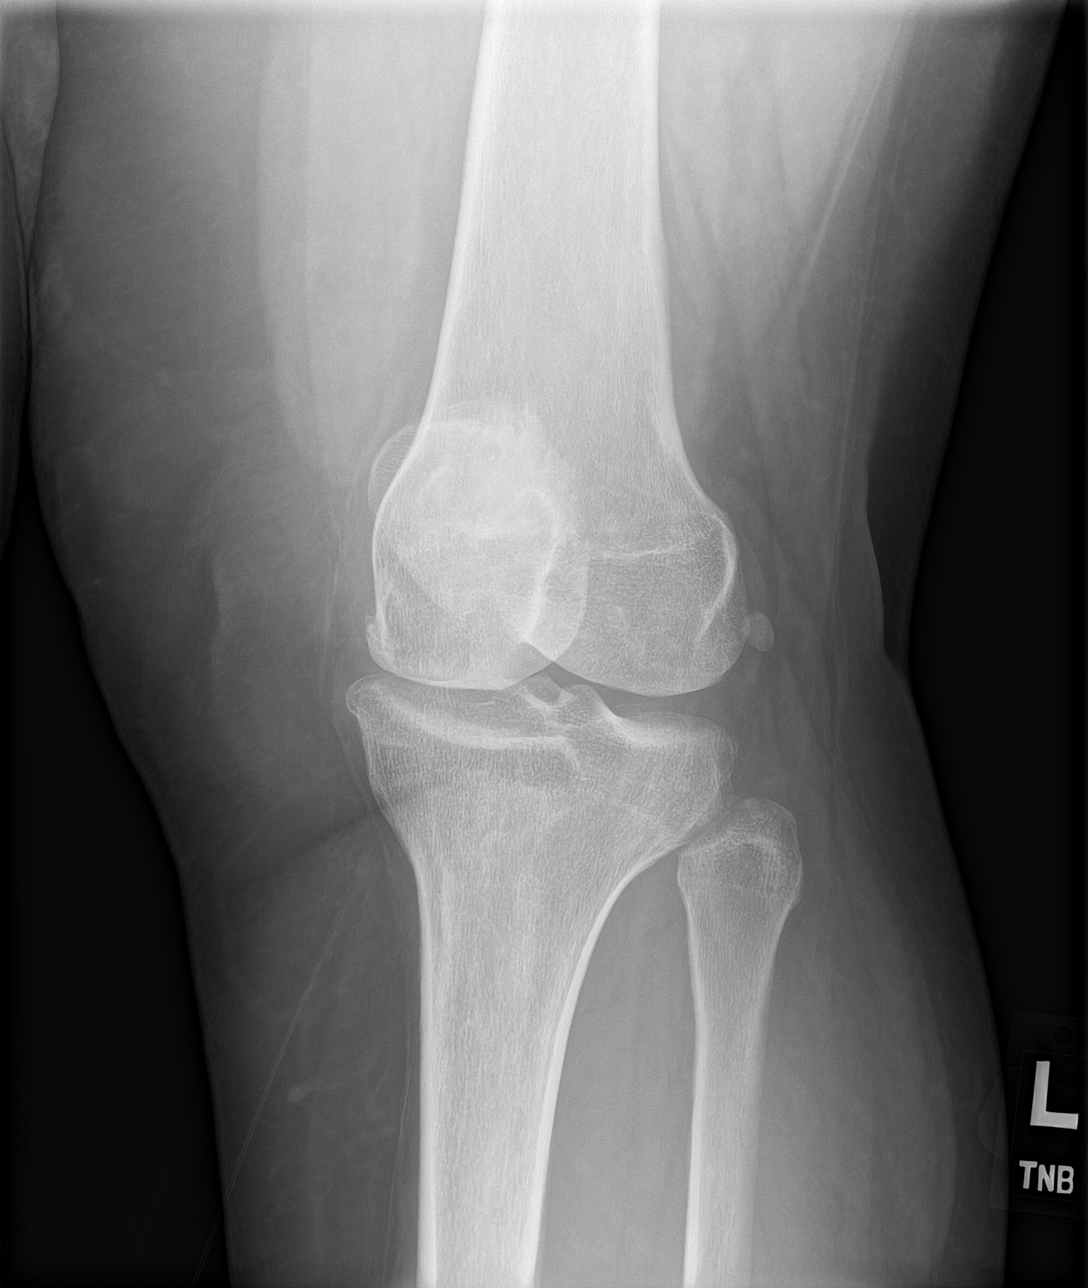

[knee obl (2 of 2)]
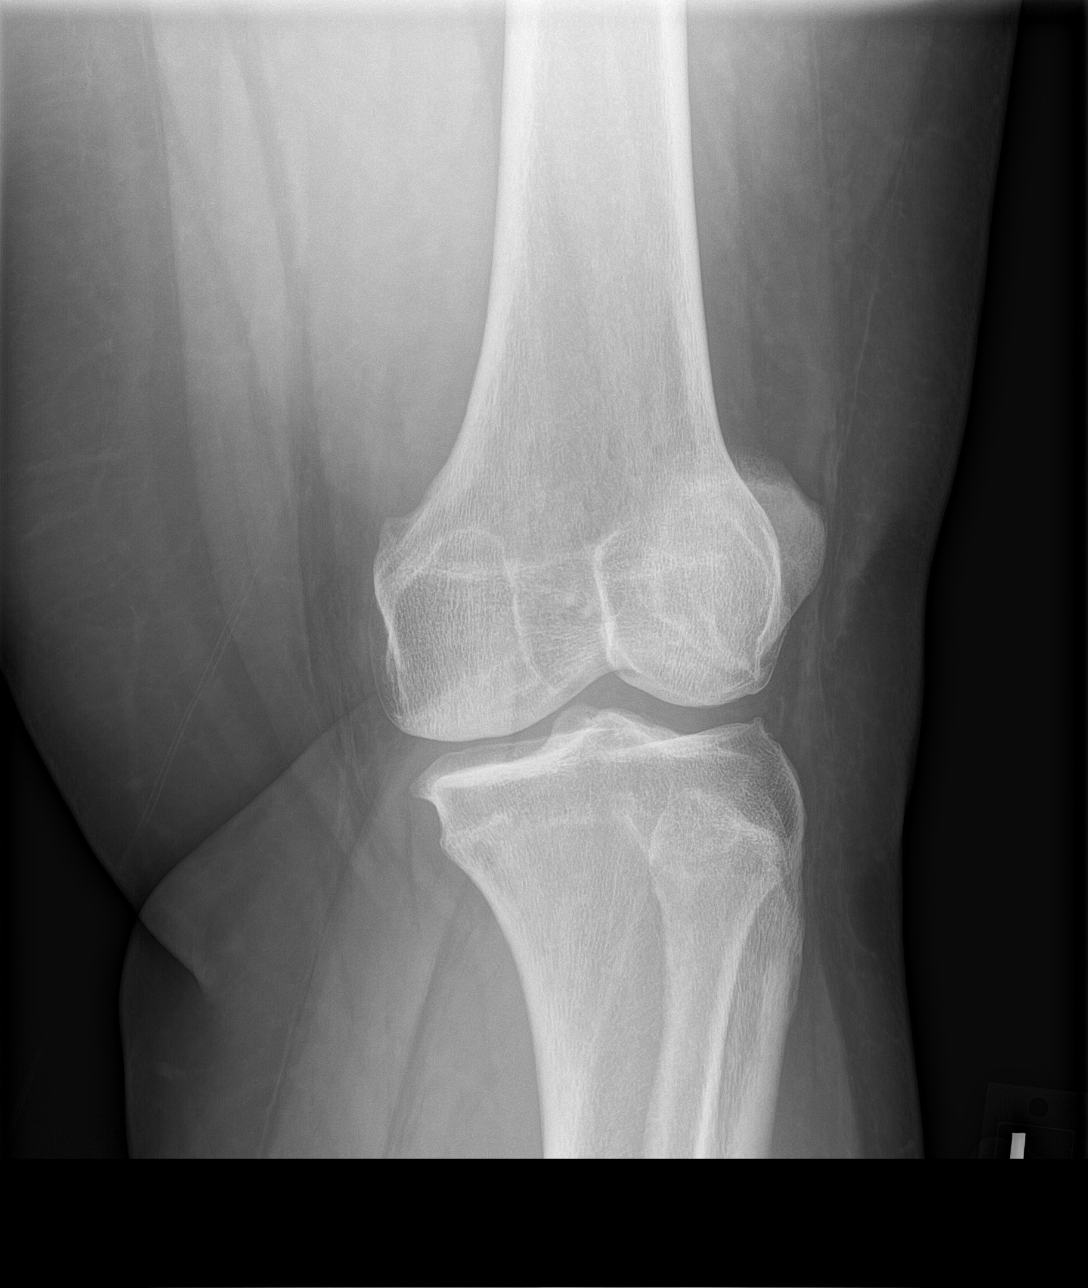

[knee lat]
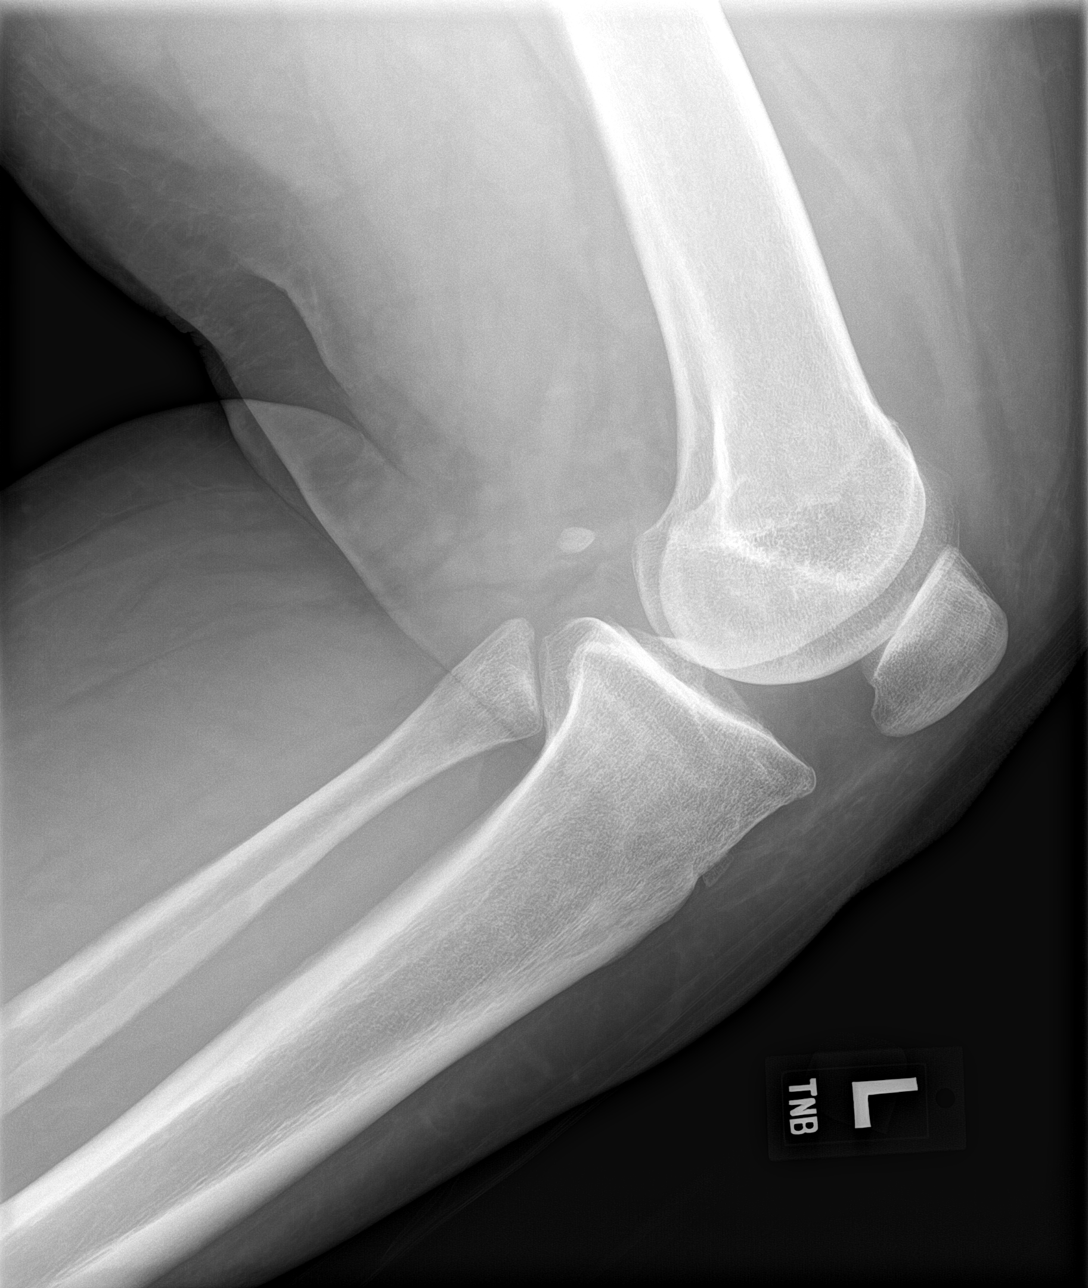

[4 of 4 positions shown; findings below may reference images not displayed]

FINDINGS: Left knee:

Mild medial compartment joint space narrowing and mild-to-moderate
peripheral degenerative osteophytes, mildly worsened from
07/05/2017. The lateral compartment joint space is maintained with
minimal peripheral degenerative osteophytosis. No joint effusion. No
acute fracture or dislocation. Small well corticated chronic ossicle
at the superior aspect of the tibial tubercle is unchanged, likely
the sequela of remote Osgood-Schlatter disease.

Right knee:

Mild medial joint space narrowing with mild-to-moderate peripheral
degenerative osteophytosis. The lateral compartment joint spaces
maintained with minimal peripheral degenerative osteophytosis. No
joint effusion. No acute fracture or dislocation. Small ossicle just
superior to the patella on lateral view, possible tiny loose body.
IMPRESSION: 1. Left knee mild-to-moderate medial compartment osteoarthritis,
mildly worsened from prior.
2. Right knee mild to moderate medial compartment osteoarthritis.

## 2023-02-23 ENCOUNTER — Other Ambulatory Visit (INDEPENDENT_AMBULATORY_CARE_PROVIDER_SITE_OTHER): Payer: Medicaid Other

## 2023-02-23 ENCOUNTER — Other Ambulatory Visit (HOSPITAL_COMMUNITY)
Admission: RE | Admit: 2023-02-23 | Discharge: 2023-02-23 | Disposition: A | Discharge status: Home / Self Care | Payer: Medicaid Other | Source: Ambulatory Visit | Attending: Obstetrics & Gynecology | Admitting: Obstetrics & Gynecology

## 2023-02-23 DIAGNOSIS — N898 Other specified noninflammatory disorders of vagina: Secondary | ICD-10-CM

## 2023-02-23 LAB — POCT URINALYSIS DIPSTICK OB
Glucose, UA: NEGATIVE
Ketones, UA: NEGATIVE
Nitrite, UA: NEGATIVE
POC,PROTEIN,UA: NEGATIVE

## 2023-02-23 NOTE — Progress Notes (Signed)
   NURSE VISIT- VAGINITIS  SUBJECTIVE:  Gina Snow is a 33 y.o. [id number] GYN patientfemale here for a vaginal swab for vaginitis screening.  She reports the following symptoms: vulvar itching for 1 week. Concerned she may have an UTI as well even though she has not been having any symptoms. Hasn't had intercourse in a year until recently.  Denies abnormal vaginal bleeding, significant pelvic pain, fever.  OBJECTIVE:  There were no vitals taken for this visit.  Appears well, in no apparent distress  ASSESSMENT: Vaginal swab for vaginitis screening  PLAN: Self-collected vaginal probe for Gonorrhea, Chlamydia, Trichomonas, Bacterial Vaginosis, Yeast sent to lab Treatment: to be determined once results are received Follow-up as needed if symptoms persist/worsen, or new symptoms develop  Terry Abila  02/23/2023 2:52 PM

## 2023-02-24 LAB — URINALYSIS, ROUTINE W REFLEX MICROSCOPIC
Bilirubin, UA: NEGATIVE
Glucose, UA: NEGATIVE
Ketones, UA: NEGATIVE
Nitrite, UA: NEGATIVE
Protein,UA: NEGATIVE
Specific Gravity, UA: 1.022 (ref 1.005–1.030)
Urobilinogen, Ur: 0.2 mg/dL (ref 0.2–1.0)
pH, UA: 6 (ref 5.0–7.5)

## 2023-02-24 LAB — MICROSCOPIC EXAMINATION
Casts: NONE SEEN /[LPF]
WBC, UA: >30 /[HPF] — AB (ref 0–5)

## 2023-02-25 LAB — URINE CULTURE

## 2023-02-27 LAB — CERVICOVAGINAL ANCILLARY ONLY
Bacterial Vaginitis (gardnerella): POSITIVE — AB
Candida Glabrata: NEGATIVE
Candida Vaginitis: POSITIVE — AB
Chlamydia: NEGATIVE
Comment: NEGATIVE
Comment: NEGATIVE
Comment: NEGATIVE
Comment: NEGATIVE
Comment: NEGATIVE
Comment: NORMAL
Neisseria Gonorrhea: NEGATIVE
Trichomonas: POSITIVE — AB

## 2023-02-28 ENCOUNTER — Other Ambulatory Visit: Payer: Self-pay | Admitting: Adult Health

## 2023-02-28 ENCOUNTER — Encounter: Payer: Self-pay | Admitting: Adult Health

## 2023-02-28 DIAGNOSIS — A599 Trichomoniasis, unspecified: Secondary | ICD-10-CM | POA: Insufficient documentation

## 2023-02-28 MED ORDER — FLUCONAZOLE 150 MG PO TABS: ORAL_TABLET | ORAL | 1 refills | Status: DC

## 2023-02-28 MED ORDER — METRONIDAZOLE 500 MG PO TABS: 500.0000 mg | ORAL_TABLET | Freq: Two times a day (BID) | ORAL | 0 refills | Status: DC

## 2023-03-14 ENCOUNTER — Other Ambulatory Visit: Payer: Medicaid Other | Admitting: *Deleted

## 2023-03-14 ENCOUNTER — Other Ambulatory Visit (HOSPITAL_COMMUNITY)
Admission: RE | Admit: 2023-03-14 | Discharge: 2023-03-14 | Disposition: A | Discharge status: Home / Self Care | Payer: Medicaid Other | Source: Ambulatory Visit | Attending: Obstetrics & Gynecology | Admitting: Obstetrics & Gynecology

## 2023-03-14 DIAGNOSIS — Z09 Encounter for follow-up examination after completed treatment for conditions other than malignant neoplasm: Secondary | ICD-10-CM | POA: Insufficient documentation

## 2023-03-14 DIAGNOSIS — Z8619 Personal history of other infectious and parasitic diseases: Secondary | ICD-10-CM | POA: Insufficient documentation

## 2023-03-14 NOTE — Progress Notes (Signed)
   NURSE VISIT- POC  SUBJECTIVE:  Gina Snow is a 33 y.o. H4V4259 GYN patientfemale here for a vaginal swab for proof of cure after treatment for Trichomonas.  She reports the following symptoms: none Denies abnormal vaginal bleeding, significant pelvic pain, fever, or UTI symptoms.  OBJECTIVE:  There were no vitals taken for this visit.  Appears well, in no apparent distress  ASSESSMENT: Vaginal swab for proof of cure after treatment for trichomonas  PLAN: Self-collected vaginal probe for Trichomonas, Bacterial Vaginosis, Yeast sent to lab Treatment: to be determined once results are received Follow-up as needed if symptoms persist/worsen, or new symptoms develop  Jobe Marker  03/14/2023 8:34 AM

## 2023-03-15 ENCOUNTER — Other Ambulatory Visit: Payer: Self-pay | Admitting: Adult Health

## 2023-03-15 LAB — CERVICOVAGINAL ANCILLARY ONLY
Bacterial Vaginitis (gardnerella): NEGATIVE
Candida Glabrata: NEGATIVE
Candida Vaginitis: NEGATIVE
Comment: NEGATIVE
Comment: NEGATIVE
Comment: NEGATIVE
Comment: NEGATIVE
Trichomonas: POSITIVE — AB

## 2023-03-15 MED ORDER — METRONIDAZOLE 500 MG PO TABS: 500.0000 mg | ORAL_TABLET | Freq: Two times a day (BID) | ORAL | 0 refills | Status: DC

## 2023-03-15 NOTE — Progress Notes (Signed)
+  trich on vaginal swab, rx flagyl no sex or alcohol while taking, will need proof of cure in 2 weeks

## 2023-03-29 ENCOUNTER — Other Ambulatory Visit (HOSPITAL_COMMUNITY)
Admission: RE | Admit: 2023-03-29 | Discharge: 2023-03-29 | Disposition: A | Discharge status: Home / Self Care | Payer: Medicaid Other | Source: Ambulatory Visit | Attending: Adult Health | Admitting: Adult Health

## 2023-03-29 ENCOUNTER — Ambulatory Visit: Payer: Medicaid Other | Admitting: Adult Health

## 2023-03-29 ENCOUNTER — Encounter: Payer: Self-pay | Admitting: Adult Health

## 2023-03-29 VITALS — BP 122/79 | HR 77 | Ht 64.0 in | Wt 286.0 lb

## 2023-03-29 DIAGNOSIS — Z09 Encounter for follow-up examination after completed treatment for conditions other than malignant neoplasm: Secondary | ICD-10-CM | POA: Insufficient documentation

## 2023-03-29 DIAGNOSIS — A599 Trichomoniasis, unspecified: Secondary | ICD-10-CM

## 2023-03-29 NOTE — Progress Notes (Signed)
  Subjective:     Patient ID: Gina Snow, female   DOB: [birth year], 33 y.o.   MRN: [id number]  HPI Gina Snow is a 33 year old black female,single, G2P1102 in for proof of treatment for trich, has had to take flagyl twice now. Has not had sex and says she feels better this time.     Component Value Date/Time   DIAGPAP  04/25/2022 1222    - Negative for intraepithelial lesion or malignancy (NILM)   DIAGPAP  09/02/2020 1140    - Negative for intraepithelial lesion or malignancy (NILM)   DIAGPAP (A) 10/31/2017 0000    HIGH GRADE SQUAMOUS INTRAEPITHELIAL LESION: CIN-2/ CIN-3/CIS (HSIL).   HPVHIGH Negative 04/25/2022 1222   HPVHIGH Negative 09/02/2020 1140   ADEQPAP  04/25/2022 1222    Satisfactory for evaluation; transformation zone component PRESENT.   ADEQPAP Satisfactory for evaluation.  The absence of an 09/02/2020 1140   ADEQPAP  09/02/2020 1140    endocervical/transformation zone component is not uncommon in pregnant   ADEQPAP patients. 09/02/2020 1140    PCP is T McCorkle FNP   Review of Systems Feels better than last time,  took meds and has not had sex Reviewed past medical,surgical, social and family history. Reviewed medications and allergies.     Objective:   Physical Exam BP 122/79 (BP Location: Left Arm, Patient Position: Sitting, Cuff Size: Large)   Pulse 77   Ht 5\' 4"  (1.626 m)   Wt 286 lb (129.7 kg)   BMI 49.09 kg/m     Skin warm and dry.Pelvic: external genitalia is normal in appearance no lesions, vagina:scant white discharge without odor,urethra has no lesions or masses noted, cervix:smooth and bulbous, uterus: normal size, shape and contour, non tender, no masses felt, adnexa: no masses or tenderness noted. Bladder is non tender and no masses felt. CV swab obtained. Fall risk is low  [facility] - 03/29/23 0846       Pregnancy Intention Screening   Does the patient want to become pregnant in the next year? No    Does the patient's partner want to become  pregnant in the next year? No    Would the patient like to discuss contraceptive options today? No      Contraception Wrap Up   Current Method Hormonal Implant    End Method Hormonal Implant    Contraception Counseling Provided Yes            Examination chaperoned by Malachy Mood LPN  Assessment:     1. Follow-up exam after treatment (Primary) Treated for trich, CV swab sent for POT of trich  - Cervicovaginal ancillary only( [place])  2. Trichomoniasis Took meds,for second time, has not had sex CV sent for trich - Cervicovaginal ancillary only( )     Plan:     Follow up prn

## 2023-03-30 ENCOUNTER — Other Ambulatory Visit: Payer: Self-pay | Admitting: Adult Health

## 2023-03-30 LAB — CERVICOVAGINAL ANCILLARY ONLY
Comment: NEGATIVE
Trichomonas: POSITIVE — AB

## 2023-03-30 MED ORDER — TINIDAZOLE 500 MG PO TABS: ORAL_TABLET | ORAL | 0 refills | Status: DC

## 2023-03-30 NOTE — Progress Notes (Signed)
+  trich on vaginal swab, will rx tinidazole 500 mg take 4 tablets po daily for 7 days and recheck for proof ot treatment in 2 weeks, no sex or alcohol

## 2023-04-18 ENCOUNTER — Encounter: Payer: Self-pay | Admitting: *Deleted

## 2023-04-18 ENCOUNTER — Other Ambulatory Visit: Payer: Medicaid Other

## 2023-04-21 ENCOUNTER — Ambulatory Visit: Payer: Medicaid Other

## 2023-05-01 ENCOUNTER — Ambulatory Visit: Admitting: *Deleted

## 2023-05-01 ENCOUNTER — Other Ambulatory Visit (HOSPITAL_COMMUNITY)
Admission: RE | Admit: 2023-05-01 | Discharge: 2023-05-01 | Disposition: A | Discharge status: Home / Self Care | Source: Ambulatory Visit | Attending: Obstetrics & Gynecology | Admitting: Obstetrics & Gynecology

## 2023-05-01 DIAGNOSIS — Z8619 Personal history of other infectious and parasitic diseases: Secondary | ICD-10-CM

## 2023-05-01 DIAGNOSIS — Z09 Encounter for follow-up examination after completed treatment for conditions other than malignant neoplasm: Secondary | ICD-10-CM | POA: Diagnosis present

## 2023-05-01 NOTE — Progress Notes (Signed)
   NURSE VISIT- POC  SUBJECTIVE:  Gina Snow is a 33 y.o. O1H0865 GYN patientfemale here for a vaginal swab for proof of cure after treatment for Trichomonas.  She reports the following symptoms: none. Denies abnormal vaginal bleeding, significant pelvic pain, fever, or UTI symptoms.  OBJECTIVE:  There were no vitals taken for this visit.  Appears well, in no apparent distress  ASSESSMENT: Vaginal swab for proof of cure after treatment for trichomonas  PLAN: Self-collected vaginal probe for Trichomonas sent to lab Treatment: to be determined once results are received Follow-up as needed if symptoms persist/worsen, or new symptoms develop  Jobe Marker  05/01/2023 11:44 AM

## 2023-05-02 LAB — CERVICOVAGINAL ANCILLARY ONLY
Comment: NEGATIVE
Trichomonas: NEGATIVE

## 2023-05-16 ENCOUNTER — Ambulatory Visit: Payer: Medicaid Other | Admitting: Women's Health

## 2023-05-16 ENCOUNTER — Other Ambulatory Visit (HOSPITAL_COMMUNITY)
Admission: RE | Admit: 2023-05-16 | Discharge: 2023-05-16 | Disposition: A | Discharge status: Home / Self Care | Source: Ambulatory Visit | Attending: Women's Health | Admitting: Women's Health

## 2023-05-16 ENCOUNTER — Encounter: Payer: Self-pay | Admitting: Women's Health

## 2023-05-16 VITALS — BP 134/86 | HR 76 | Ht 64.0 in | Wt 288.4 lb

## 2023-05-16 DIAGNOSIS — Z131 Encounter for screening for diabetes mellitus: Secondary | ICD-10-CM | POA: Diagnosis not present

## 2023-05-16 DIAGNOSIS — Z113 Encounter for screening for infections with a predominantly sexual mode of transmission: Secondary | ICD-10-CM

## 2023-05-16 DIAGNOSIS — R7303 Prediabetes: Secondary | ICD-10-CM

## 2023-05-16 DIAGNOSIS — Z01419 Encounter for gynecological examination (general) (routine) without abnormal findings: Secondary | ICD-10-CM | POA: Diagnosis not present

## 2023-05-16 DIAGNOSIS — Z6841 Body Mass Index (BMI) 40.0 and over, adult: Secondary | ICD-10-CM | POA: Diagnosis not present

## 2023-05-16 NOTE — Progress Notes (Signed)
 WELL-WOMAN EXAMINATION Patient name: Gina Snow MRN [id number]  Date of birth: 24-Nov-1990 Chief Complaint:   Gynecologic Exam (Wants STD screening. Pap 04-25-22 normal)  History of Present Illness:   Gina Snow is a 33 y.o. G82P1102 African-American female being seen today for a routine well-woman exam.  Current complaints: wants full STD screen, no known exposure or current sx  PCP: [facility], wants list of local PCPs      No LMP recorded. Patient has had an implant. The current method of family planning is Nexplanon placed 03/15/21 Last pap 04/25/22. Results were: NILM w/ HRHPV negative. H/O abnormal pap: yes Last mammogram: never. Results were: N/A. Family h/o breast cancer: no Last colonoscopy: never. Results were: N/A. Family h/o colorectal cancer: no     05/16/2023    8:38 AM 04/25/2022    8:49 AM 12/17/2020    9:43 AM 09/02/2020   11:40 AM 10/03/2017    1:55 PM  Depression screen PHQ 2/9  Decreased Interest 1 0 2 1 0  Down, Depressed, Hopeless 0 1 0 2 0  PHQ - 2 Score 1 1 2 3  0  Altered sleeping 2 1 2 1 1   Tired, decreased energy 1 0 3 1 2   Change in appetite 1 0 0 0 1  Feeling bad or failure about yourself  0 0 0 0 0  Trouble concentrating 0 0 0 0 0  Moving slowly or fidgety/restless 0 0 0 0 0  Suicidal thoughts 0 0 0 0 0  PHQ-9 Score 5 2 7 5 4         05/16/2023    8:39 AM 04/25/2022    8:49 AM 12/17/2020    9:43 AM 09/02/2020   11:40 AM  GAD 7 : Generalized Anxiety Score  Nervous, Anxious, on Edge 0 1 0 0  Control/stop worrying 0 0 0 1  Worry too much - different things 0 0 0 0  Trouble relaxing 0 0 1 1  Restless 0 0 0 0  Easily annoyed or irritable 0 0 1 0  Afraid - awful might happen  0 0 0  Total GAD 7 Score  1 2 2      Review of Systems:   Pertinent items are noted in HPI Denies any headaches, blurred vision, fatigue, shortness of breath, chest pain, abdominal pain, abnormal vaginal discharge/itching/odor/irritation, problems with periods,  bowel movements, urination, or intercourse unless otherwise stated above. Pertinent History Reviewed:  Reviewed past medical,surgical, social and family history.  Reviewed problem list, medications and allergies. Physical Assessment:   Vitals:   05/16/23 0826  BP: 134/86  Pulse: 76  Weight: 288 lb 6.4 oz (130.8 kg)  Height: 5\' 4"  (1.626 m)  Body mass index is 49.5 kg/m.        Physical Examination:   General appearance - well appearing, and in no distress  Mental status - alert, oriented to person, place, and time  Psych:  She has a normal mood and affect  Skin - warm and dry, normal color, no suspicious lesions noted  Chest - effort normal, all lung fields clear to auscultation bilaterally  Heart - normal rate and regular rhythm  Neck:  midline trachea, no thyromegaly or nodules  Breasts - breasts appear normal, no suspicious masses, no skin or nipple changes or  axillary nodes  Abdomen - soft, nontender, nondistended, no masses or organomegaly  Pelvic - VULVA: normal appearing vulva with no masses, tenderness or lesions  VAGINA: normal appearing  vagina with normal color and discharge, no lesions  CERVIX: normal appearing cervix without discharge or lesions, no CMT  Thin prep pap is not done   UTERUS: unable to adequately assess d/t body habitus, nontender   ADNEXA: unable to adequately assess d/t body habitus, no adnexal masses or tenderness noted.  Extremities:  No swelling or varicosities noted  Chaperone: Peggy Dones  No results found for this or any previous visit (from the past 24 hours).  Assessment & Plan:  1) Well-Woman Exam  2) STD screen  3) Diabetes screen  Labs/procedures today: as below  Mammogram: @ 31yo, or sooner if problems Colonoscopy: @ 31yo, or sooner if problems  Orders Placed This Encounter  Procedures   HIV Antibody (routine testing w rflx)   RPR   Hepatitis B surface antigen   Hemoglobin A1c    Meds: No orders of the defined types were  placed in this encounter.   Follow-up: Return in about 1 year (around 05/15/2024) for Physical.  Cheral Marker CNM, WHNP-BC 05/16/2023 9:03 AM

## 2023-05-16 NOTE — Patient Instructions (Signed)
 Primary Care Providers Dr. Dwana Melena [place]) [phone] [facility] [phone] [facility] [phone] [facility] [place]) [phone] [facility] [place]) [phone] [facility] [place]) [phone] [facility] [phone] [facility] [phone]

## 2023-05-17 ENCOUNTER — Encounter: Payer: Self-pay | Admitting: Women's Health

## 2023-05-17 LAB — HEPATITIS B SURFACE ANTIGEN: Hepatitis B Surface Ag: NEGATIVE

## 2023-05-17 LAB — CERVICOVAGINAL ANCILLARY ONLY
Bacterial Vaginitis (gardnerella): NEGATIVE
Candida Glabrata: NEGATIVE
Candida Vaginitis: NEGATIVE
Chlamydia: NEGATIVE
Comment: NEGATIVE
Comment: NEGATIVE
Comment: NEGATIVE
Comment: NEGATIVE
Comment: NEGATIVE
Comment: NORMAL
Neisseria Gonorrhea: NEGATIVE
Trichomonas: NEGATIVE

## 2023-05-17 LAB — RPR: RPR Ser Ql: NONREACTIVE

## 2023-05-17 LAB — HIV ANTIBODY (ROUTINE TESTING W REFLEX): HIV Screen 4th Generation wRfx: NONREACTIVE

## 2023-05-17 LAB — HEMOGLOBIN A1C
Est. average glucose Bld gHb Est-mCnc: 131 mg/dL
Hgb A1c MFr Bld: 6.2 % — ABNORMAL HIGH (ref 4.8–5.6)

## 2023-05-17 NOTE — Addendum Note (Signed)
 Addended by: Cheral Marker on: 05/17/2023 08:36 AM   Modules accepted: Orders

## 2023-06-20 ENCOUNTER — Other Ambulatory Visit (HOSPITAL_COMMUNITY): Payer: Self-pay | Admitting: Family Medicine

## 2023-06-20 DIAGNOSIS — Z136 Encounter for screening for cardiovascular disorders: Secondary | ICD-10-CM

## 2023-06-21 ENCOUNTER — Ambulatory Visit (HOSPITAL_COMMUNITY)
Admission: RE | Admit: 2023-06-21 | Discharge: 2023-06-21 | Disposition: A | Discharge status: Home / Self Care | Source: Ambulatory Visit | Attending: Family Medicine | Admitting: Family Medicine

## 2023-06-21 DIAGNOSIS — Z136 Encounter for screening for cardiovascular disorders: Secondary | ICD-10-CM | POA: Diagnosis present

## 2023-07-03 ENCOUNTER — Ambulatory Visit: Admitting: Podiatrist

## 2023-07-13 ENCOUNTER — Ambulatory Visit: Admitting: Podiatry

## 2023-07-17 ENCOUNTER — Ambulatory Visit: Admitting: Podiatry

## 2023-07-17 ENCOUNTER — Encounter: Payer: Self-pay | Admitting: Podiatry

## 2023-07-17 DIAGNOSIS — B351 Tinea unguium: Secondary | ICD-10-CM

## 2023-07-17 DIAGNOSIS — L600 Ingrowing nail: Secondary | ICD-10-CM

## 2023-07-17 DIAGNOSIS — Z79899 Other long term (current) drug therapy: Secondary | ICD-10-CM

## 2023-07-17 MED ORDER — TERBINAFINE HCL 250 MG PO TABS: 250.0000 mg | ORAL_TABLET | Freq: Every day | ORAL | 0 refills | Status: DC

## 2023-07-17 NOTE — Patient Instructions (Signed)

## 2023-07-19 NOTE — Progress Notes (Signed)
  Subjective:  Patient ID: Gina Snow, female    DOB: 10/13/1990,  MRN: [id number]  Chief Complaint  Patient presents with   Nail Problem    RM#12 Patient states she has fungus under toe nails noticed several months ago.    Discussed the use of AI scribe software for clinical note transcription with the patient, who gave verbal consent to proceed.  History of Present Illness Gina Snow is a 33 year old female who presents with concerns about toenail fungus and ingrown toenails.  She has been experiencing toenail fungus and ingrown toenails, particularly affecting the right big toe, for approximately three months. The condition began after a visit to a nail salon, with significant pain and swelling around the toenails. Pus formation was noted before her last salon visit, but the pain subsided afterward. Her family observed dark discoloration around the affected area in mid-April, which has since lightened with the use of a topical treatment (Penlac).   She has been using a topical antifungal treatment, possibly Ciclopirox or Penlac, for the past two weeks. She is unsure if all her toenails are affected by the fungus. Currently, there is no pain or soreness in the right big toenail.  She has experienced similar toenail issues during physical activities like track in her high school years. She smokes about two packs of cigarettes per week and consumes alcohol approximately three times a week. She works as a Education administrator (CNA).      Objective:    Physical Exam General: AAO x3, NAD  Dermatological: Right hallux nails hypertrophic, dystrophic.  There is incurvation present to nail borders but there is no edema, erythema, drainage or pus or any obvious signs of infection noted today.  There are no open lesions.  Vascular: Dorsalis Pedis artery and Posterior Tibial artery pedal pulses are 2/4 bilateral with immedate capillary fill time. There is no pain with calf  compression, swelling, warmth, erythema.   Neruologic: Grossly intact via light touch bilateral.   Musculoskeletal: No pain on exam today.  Gait: Unassisted, Nonantalgic.     No images are attached to the encounter.    Results LABS Creatinine: 0.71 (06/16/2023) Alkaline Phosphatase: 75 (06/16/2023) AST: 16 (06/16/2023) ALT: 22 (06/16/2023) Platelet: 261 (06/16/2023)    Assessment:   1. Long-term use of high-risk medication   2. Dermatophytosis of nail   3. Ingrown toenail      Plan:  Patient was evaluated and treated and all questions answered.  Assessment and Plan Assessment & Plan Onychomycosis Chronic onychomycosis with thickening and discoloration, particularly on the right hallux. Topical treatment showed some improvement. Oral antifungal discussed for higher success rate, requires liver monitoring due to hepatotoxicity risk. Advised alcohol avoidance during treatment. - Prescribe oral antifungal medication, Lamisil.  - Continue topical antifungal treatment. - Recheck liver function tests in six weeks. - Advise to avoid alcohol during treatment. - Instruct to report side effects such as gastrointestinal upset, myalgia, or metallic taste.  Ingrown toenail Recurrent ingrown toenail on right hallux, currently asymptomatic. Discussed toenail removal for recurrent infections or pain. - Monitor for recurrent pain or infection. - Consider partial or complete toenail removal if symptoms persist.   Return in about 3 months (around 10/17/2023) for nail fungus .    Charity Conch DPM

## 2023-07-25 ENCOUNTER — Ambulatory Visit: Payer: Self-pay | Admitting: Podiatry

## 2023-07-25 ENCOUNTER — Other Ambulatory Visit: Payer: Self-pay | Admitting: Podiatry

## 2023-07-25 DIAGNOSIS — Z79899 Other long term (current) drug therapy: Secondary | ICD-10-CM

## 2023-07-25 LAB — HEPATIC FUNCTION PANEL
ALT: 22 IU/L (ref 0–32)
AST: 16 IU/L (ref 0–40)
Albumin: 4.4 g/dL (ref 4.0–5.0)
Alkaline Phosphatase: 77 IU/L (ref 44–121)
Bilirubin Total: 0.3 mg/dL (ref 0.0–1.2)
Bilirubin, Direct: 0.14 mg/dL (ref 0.00–0.40)
Total Protein: 6.7 g/dL (ref 6.0–8.5)

## 2023-07-25 LAB — CBC WITH DIFFERENTIAL/PLATELET
Basophils Absolute: 0 10*3/uL (ref 0.0–0.2)
Basos: 0 %
EOS (ABSOLUTE): 0.1 10*3/uL (ref 0.0–0.4)
Eos: 1 %
Hematocrit: 43 % (ref 34.0–46.6)
Hemoglobin: 14 g/dL (ref 11.1–15.9)
Immature Grans (Abs): 0 10*3/uL (ref 0.0–0.1)
Immature Granulocytes: 0 %
Lymphocytes Absolute: 2.6 10*3/uL (ref 0.7–3.1)
Lymphs: 32 %
MCH: 31.6 pg (ref 26.6–33.0)
MCHC: 32.6 g/dL (ref 31.5–35.7)
MCV: 97 fL (ref 79–97)
Monocytes Absolute: 0.5 10*3/uL (ref 0.1–0.9)
Monocytes: 6 %
Neutrophils Absolute: 5 10*3/uL (ref 1.4–7.0)
Neutrophils: 61 %
Platelets: 240 10*3/uL (ref 150–450)
RBC: 4.43 x10E6/uL (ref 3.77–5.28)
RDW: 13 % (ref 11.7–15.4)
WBC: 8.2 10*3/uL (ref 3.4–10.8)

## 2023-08-23 ENCOUNTER — Other Ambulatory Visit: Payer: Self-pay | Admitting: Family Medicine

## 2023-08-23 DIAGNOSIS — R739 Hyperglycemia, unspecified: Secondary | ICD-10-CM

## 2023-10-13 ENCOUNTER — Encounter: Payer: Self-pay | Admitting: Podiatry

## 2023-10-13 ENCOUNTER — Ambulatory Visit: Admitting: Podiatry

## 2023-10-13 DIAGNOSIS — L600 Ingrowing nail: Secondary | ICD-10-CM | POA: Diagnosis not present

## 2023-10-13 MED ORDER — CEPHALEXIN 500 MG PO CAPS: 500.0000 mg | ORAL_CAPSULE | Freq: Three times a day (TID) | ORAL | 0 refills | Status: DC

## 2023-10-13 NOTE — Patient Instructions (Signed)

## 2023-10-13 NOTE — Progress Notes (Signed)
 Subjective:  Patient ID: Gina Snow, female    DOB: 05-12-1990,  MRN: [id number]  Chief Complaint  Patient presents with   Ingrown Toenail    Right great toenail painful.6 pain. Discontinued Lamisil  due to alcohol consumption. Non diabetic A1C 5.8     Discussed the use of AI scribe software for clinical note transcription with the patient, who gave verbal consent to proceed.  History of Present Illness Gina Snow is a 33 year old female who presents with bilateral toenail pain and thickening.  She experiences discomfort in both big toenails, especially with pressure. Initially localized to the corners, the pain has spread to the top of the nails. The pain is noticeable but not severe. She discontinued Lamisil  after three and a half weeks of use. She is not on blood thinners and has no known medication allergies. She is prediabetic.      Objective:    Physical Exam General: AAO x3, NAD  Dermatological: Bilateral hallux nails are hypertrophic, dystrophic and is incurvation present all nail borders.  Tenderness palpation entire toenail right side worse than left.  There is no drainage or pus or any open lesions.  Vascular: Dorsalis Pedis artery and Posterior Tibial artery pedal pulses are 2/4 bilateral with immedate capillary fill time.  There is no pain with calf compression, swelling, warmth, erythema.   Neruologic: Grossly intact via light touch bilateral.   Musculoskeletal: Tenderness of the hallux toenails.  No other areas of discomfort.  Gait: Unassisted, Nonantalgic.     No images are attached to the encounter.    Results    Assessment:   1. Ingrown toenail      Plan:  Patient was evaluated and treated and all questions answered.  Assessment and Plan Assessment & Plan Bilateral ingrowing toenails (hallux) Bilateral ingrowing toenails with thickening and soreness. Lamisil  previously discontinued. Nails sore under pressure. - Perform bilateral  hallux toenail removal without chemical matricectomy. - At this time, she wants to proceed with total partial nail removal without chemical matricectomy to the bilateral hallux due to pain.  Risks and complications were discussed with the patient for which they understand and  verbally consent to the procedure.  Well aware no guarantee the nails can come back and better and they could come back and worse.  Under sterile conditions a total of 3 mL of a mixture of 2% lidocaine  plain and 0.5% Marcaine  plain was infiltrated in a hallux block fashion. Once anesthetized, the skin was prepped in sterile fashion. A tourniquet was then applied.  Next the hallux nail borders were removed in total manage remove all nail corners.  Once the nail was  Removed, the area was debrided and the underlying skin was intact. The area was irrigated and hemostasis was obtained.  A dry sterile dressing was applied. After application of the dressing the tourniquet was removed and there is found to be an immediate capillary refill time to the digit. The patient tolerated the procedure well any complications. Post procedure instructions were discussed the patient for which he verbally understood. Follow-up in one week for nail check or sooner if any problems are to arise. Discussed signs/symptoms of worsening infection and directed to call the office immediately should any occur or go directly to the emergency room. In the meantime, encouraged to call the office with any questions, concerns, changes symptoms. - Keflex      Return in about 2 weeks (around 10/27/2023), or if symptoms worsen or fail to improve, for nail  check .   Donnice JONELLE Fees DPM

## 2023-10-23 ENCOUNTER — Ambulatory Visit: Admitting: Podiatry

## 2023-11-13 ENCOUNTER — Encounter: Payer: Self-pay | Admitting: Podiatry

## 2023-11-13 ENCOUNTER — Ambulatory Visit: Admitting: Podiatry

## 2023-11-13 DIAGNOSIS — L600 Ingrowing nail: Secondary | ICD-10-CM

## 2023-11-13 NOTE — Progress Notes (Signed)
 Subjective: Chief Complaint  Patient presents with   Ingrown Toenail    Rm12 F/u bilateral ingrown nail removal great toes/1 month/pt says she is doing well.   33 year old female presents the office today for follow-up evaluation status post nail removal of bilateral hallux toenails.  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  She is not having any pain, swelling or redness or any drainage.  She still soaking Epsom salts.  Objective: AAO x3, NAD DP/PT pulses palpable bilaterally, CRT less than 3 seconds Status post nail removal which appear to be healing well.  Some scabbing still present but there is no edema, erythema, drainage or pus or signs of infection.  There is no tenderness on exam today.  There is no open lesions. No pain with calf compression, swelling, warmth, erythema  Assessment: Status post nail removal, healing well  Plan: -All treatment options discussed with the patient including all alternatives, risks, complications.  -Discussed washing with soap and water, dry thoroughly.  Discussed need to monitor the toenails as they grow back and then should they come back thick or discolored need to start with topical medication or oral medication to hopefully help facilitate them coming in more clear.  Monitor for any signs or symptoms of infection or reoccurrence. -Patient encouraged to call the office with any questions, concerns, change in symptoms.   Gina Snow DPM

## 2023-11-27 ENCOUNTER — Ambulatory Visit: Admitting: Podiatry

## 2024-02-26 ENCOUNTER — Encounter: Payer: Self-pay | Admitting: Adult Health

## 2024-02-26 ENCOUNTER — Other Ambulatory Visit (HOSPITAL_COMMUNITY): Admission: RE | Admit: 2024-02-26 | Discharge: 2024-02-26 | Disposition: A | Source: Ambulatory Visit

## 2024-02-26 ENCOUNTER — Ambulatory Visit: Admitting: Adult Health

## 2024-02-26 VITALS — BP 159/97 | HR 74 | Ht 64.0 in | Wt 267.0 lb

## 2024-02-26 DIAGNOSIS — R030 Elevated blood-pressure reading, without diagnosis of hypertension: Secondary | ICD-10-CM | POA: Insufficient documentation

## 2024-02-26 DIAGNOSIS — Z3202 Encounter for pregnancy test, result negative: Secondary | ICD-10-CM | POA: Insufficient documentation

## 2024-02-26 DIAGNOSIS — Z113 Encounter for screening for infections with a predominantly sexual mode of transmission: Secondary | ICD-10-CM | POA: Diagnosis present

## 2024-02-26 DIAGNOSIS — Z3046 Encounter for surveillance of implantable subdermal contraceptive: Secondary | ICD-10-CM | POA: Diagnosis not present

## 2024-02-26 LAB — POCT URINE PREGNANCY: Preg Test, Ur: NEGATIVE

## 2024-02-26 MED ORDER — ETONOGESTREL 68 MG ~~LOC~~ IMPL
68.0000 mg | DRUG_IMPLANT | Freq: Once | SUBCUTANEOUS | Status: AC
  Administered 2024-02-26: 68 mg via SUBCUTANEOUS

## 2024-02-26 NOTE — Patient Instructions (Addendum)
 Use condoms x 2 weeks, keep clean and dry x 24 hours, no heavy lifting, keep steri strips on x 72 hours, Keep pressure dressing on x 24 hours. Follow up prn problems.  Keep check on BP at home if elevated, call me and see PCP in February

## 2024-02-26 NOTE — Progress Notes (Signed)
" °  Subjective:     Patient ID: Gina Snow, female   DOB: 1990-09-02, 34 y.o.   MRN: [id number]  HPI Dyamon is a 34 year old black female, single, G2P1102 in for nexplanon  removal and reinsertion and wants STD testing.     Component Value Date/Time   DIAGPAP  04/25/2022 1222    - Negative for intraepithelial lesion or malignancy (NILM)   DIAGPAP  09/02/2020 1140    - Negative for intraepithelial lesion or malignancy (NILM)   DIAGPAP (A) 10/31/2017 0000    HIGH GRADE SQUAMOUS INTRAEPITHELIAL LESION: CIN-2/ CIN-3/CIS (HSIL).   HPVHIGH Negative 04/25/2022 1222   HPVHIGH Negative 09/02/2020 1140   ADEQPAP  04/25/2022 1222    Satisfactory for evaluation; transformation zone component PRESENT.   ADEQPAP Satisfactory for evaluation.  The absence of an 09/02/2020 1140   ADEQPAP  09/02/2020 1140    endocervical/transformation zone component is not uncommon in pregnant   ADEQPAP patients. 09/02/2020 1140   PCP is [facility]  Review of Systems For nexplanon  removal and reinsertion +stress at work  Reviewed past medical,surgical, social and family history. Reviewed medications and allergies.     Objective:   Physical Exam BP (!) 159/97 (BP Location: Left Arm, Patient Position: Sitting, Cuff Size: Large)   Pulse 74   Ht 5' 4 (1.626 m)   Wt 267 lb (121.1 kg)   LMP 02/19/2024 (Approximate)   BMI 45.83 kg/m  UPT is negative, last sex was 3 weeks ago. Pt did self swab  NEXPLANON  REMOVAL AND REINSERTION: Consent form signed, and time out called.  Left arm cleansed with betadine, and injected with 1.5 cc 2% lidocaine  and waited til numb.Under sterile technique a #11 blade was used to make small vertical incision, and a curved forceps was used to easily remove rod. Then new rod inserted and palpated by provider and pt.  Steri strips applied. Pressure dressing applied.     Fall risk I slow  [facility] - 02/26/24 1502       Pregnancy Intention Screening   Does the patient want to  become pregnant in the next year? No    Does the patient's partner want to become pregnant in the next year? No    Would the patient like to discuss contraceptive options today? No      Contraception Wrap Up   Current Method Hormonal Implant    End Method Hormonal Implant    Contraception Counseling Provided Yes          Assessment:     1. Negative pregnancy test - POCT urine pregnancy  2. Encounter for removal and reinsertion of Nexplanon  (Primary) Removed nexplanon  and reinserted nexplanon  Use condoms x 2 weeks, keep clean and dry x 24 hours, no heavy lifting, keep steri strips on x 72 hours, Keep pressure dressing on x 24 hours. Follow up prn problems.  - etonogestrel  (NEXPLANON ) implant 68 mg Lot [id number] Exp 2027-08   3. Screening examination for STD (sexually transmitted disease) CV swab sent for GC/CHL,trich,BV and yeast Will check HIV and RPR  - Cervicovaginal ancillary only( [facility]) - HIV Antibody (routine testing w rflx) - RPR W/RFLX TO RPR TITER, TREPONEMAL AB, SCREEN AND DIAGNOSIS  4. Elevated BP without diagnosis of hypertension Keep check on BP at home and let me know results Keep follow up appt with PCP in February     Plan:     Remove nexplanon  on 3 years or sooner if desired     "

## 2024-02-27 ENCOUNTER — Ambulatory Visit: Payer: Self-pay | Admitting: Adult Health

## 2024-02-27 LAB — HIV ANTIBODY (ROUTINE TESTING W REFLEX): HIV Screen 4th Generation wRfx: NONREACTIVE

## 2024-02-27 LAB — SYPHILIS: RPR W/REFLEX TO RPR TITER AND TREPONEMAL ANTIBODIES, TRADITIONAL SCREENING AND DIAGNOSIS ALGORITHM: RPR Ser Ql: NONREACTIVE

## 2024-02-28 LAB — CERVICOVAGINAL ANCILLARY ONLY
Bacterial Vaginitis (gardnerella): POSITIVE — AB
Candida Glabrata: NEGATIVE
Candida Vaginitis: NEGATIVE
Chlamydia: NEGATIVE
Comment: NEGATIVE
Comment: NEGATIVE
Comment: NEGATIVE
Comment: NEGATIVE
Comment: NEGATIVE
Comment: NORMAL
Neisseria Gonorrhea: NEGATIVE
Trichomonas: NEGATIVE

## 2024-02-28 MED ORDER — METRONIDAZOLE 500 MG PO TABS: 500.0000 mg | ORAL_TABLET | Freq: Two times a day (BID) | ORAL | 0 refills | Status: AC
